# Patient Record
Sex: Female | Born: 1980
Health system: Southern US, Community
[De-identification: ages and names within clinical notes are randomized; demographics above are authoritative.]

## PROBLEM LIST (undated history)

## (undated) ENCOUNTER — Inpatient Hospital Stay (HOSPITAL_COMMUNITY): Payer: Self-pay

## (undated) DIAGNOSIS — Z8669 Personal history of other diseases of the nervous system and sense organs: Secondary | ICD-10-CM

## (undated) DIAGNOSIS — R5382 Chronic fatigue, unspecified: Secondary | ICD-10-CM

## (undated) DIAGNOSIS — F319 Bipolar disorder, unspecified: Secondary | ICD-10-CM

## (undated) DIAGNOSIS — H539 Unspecified visual disturbance: Secondary | ICD-10-CM

## (undated) DIAGNOSIS — G43909 Migraine, unspecified, not intractable, without status migrainosus: Secondary | ICD-10-CM

## (undated) DIAGNOSIS — D509 Iron deficiency anemia, unspecified: Secondary | ICD-10-CM

## (undated) DIAGNOSIS — Z87440 Personal history of urinary (tract) infections: Secondary | ICD-10-CM

## (undated) DIAGNOSIS — Z87442 Personal history of urinary calculi: Secondary | ICD-10-CM

## (undated) DIAGNOSIS — G43709 Chronic migraine without aura, not intractable, without status migrainosus: Secondary | ICD-10-CM

## (undated) DIAGNOSIS — M797 Fibromyalgia: Secondary | ICD-10-CM

## (undated) DIAGNOSIS — K219 Gastro-esophageal reflux disease without esophagitis: Secondary | ICD-10-CM

## (undated) DIAGNOSIS — F32A Depression, unspecified: Secondary | ICD-10-CM

## (undated) DIAGNOSIS — F410 Panic disorder [episodic paroxysmal anxiety] without agoraphobia: Secondary | ICD-10-CM

## (undated) DIAGNOSIS — T1491XA Suicide attempt, initial encounter: Secondary | ICD-10-CM

## (undated) DIAGNOSIS — F419 Anxiety disorder, unspecified: Secondary | ICD-10-CM

## (undated) DIAGNOSIS — G9332 Myalgic encephalomyelitis/chronic fatigue syndrome: Secondary | ICD-10-CM

## (undated) DIAGNOSIS — G4700 Insomnia, unspecified: Secondary | ICD-10-CM

## (undated) DIAGNOSIS — N3010 Interstitial cystitis (chronic) without hematuria: Secondary | ICD-10-CM

## (undated) DIAGNOSIS — T7840XA Allergy, unspecified, initial encounter: Secondary | ICD-10-CM

## (undated) DIAGNOSIS — F329 Major depressive disorder, single episode, unspecified: Secondary | ICD-10-CM

## (undated) HISTORY — DX: Personal history of urinary (tract) infections: Z87.440

## (undated) HISTORY — DX: Gastro-esophageal reflux disease without esophagitis: K21.9

## (undated) HISTORY — DX: Panic disorder (episodic paroxysmal anxiety): F41.0

## (undated) HISTORY — DX: Bipolar disorder, unspecified: F31.9

## (undated) HISTORY — DX: Allergy, unspecified, initial encounter: T78.40XA

## (undated) HISTORY — PX: CHOLECYSTECTOMY: SHX55

## (undated) HISTORY — DX: Personal history of urinary calculi: Z87.442

## (undated) HISTORY — PX: ADENOIDECTOMY: SHX5191

## (undated) HISTORY — DX: Chronic migraine without aura, not intractable, without status migrainosus: G43.709

## (undated) HISTORY — DX: Suicide attempt, initial encounter: T14.91XA

## (undated) HISTORY — PX: KNEE SURGERY: SHX244

## (undated) HISTORY — DX: Migraine, unspecified, not intractable, without status migrainosus: G43.909

## (undated) HISTORY — DX: Fibromyalgia: M79.7

## (undated) HISTORY — PX: HEMANGIOMA EXCISION: SHX1734

## (undated) HISTORY — DX: Insomnia, unspecified: G47.00

## (undated) HISTORY — DX: Interstitial cystitis (chronic) without hematuria: N30.10

## (undated) HISTORY — DX: Personal history of other diseases of the nervous system and sense organs: Z86.69

## (undated) HISTORY — DX: Unspecified visual disturbance: H53.9

---
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
## 2004-01-23 ENCOUNTER — Inpatient Hospital Stay (HOSPITAL_COMMUNITY): Admission: RE | Admit: 2004-01-23 | Discharge: 2004-01-26 | Payer: Self-pay | Admitting: Psychiatry

## 2004-09-17 ENCOUNTER — Ambulatory Visit: Payer: Self-pay | Admitting: Psychiatry

## 2004-09-17 ENCOUNTER — Inpatient Hospital Stay (HOSPITAL_COMMUNITY): Admission: EM | Admit: 2004-09-17 | Discharge: 2004-09-22 | Payer: Self-pay | Admitting: Psychiatry

## 2006-10-13 ENCOUNTER — Ambulatory Visit (HOSPITAL_COMMUNITY): Payer: Self-pay | Admitting: Psychiatry

## 2010-08-04 ENCOUNTER — Encounter: Payer: Self-pay | Admitting: Family Medicine

## 2010-10-10 ENCOUNTER — Ambulatory Visit (INDEPENDENT_AMBULATORY_CARE_PROVIDER_SITE_OTHER): Payer: 59 | Admitting: Internal Medicine

## 2010-10-10 ENCOUNTER — Encounter: Payer: Self-pay | Admitting: Internal Medicine

## 2010-10-10 ENCOUNTER — Other Ambulatory Visit: Payer: Self-pay | Admitting: Internal Medicine

## 2010-10-10 ENCOUNTER — Ambulatory Visit (HOSPITAL_BASED_OUTPATIENT_CLINIC_OR_DEPARTMENT_OTHER)
Admission: RE | Admit: 2010-10-10 | Discharge: 2010-10-10 | Disposition: A | Discharge status: Home / Self Care | Payer: 59 | Source: Ambulatory Visit | Attending: Internal Medicine | Admitting: Internal Medicine

## 2010-10-10 VITALS — BP 124/60 | HR 87 | Temp 98.7°F | Resp 18 | Ht 64.25 in | Wt 189.0 lb

## 2010-10-10 DIAGNOSIS — E538 Deficiency of other specified B group vitamins: Secondary | ICD-10-CM

## 2010-10-10 DIAGNOSIS — M79609 Pain in unspecified limb: Secondary | ICD-10-CM | POA: Insufficient documentation

## 2010-10-10 DIAGNOSIS — M545 Low back pain, unspecified: Secondary | ICD-10-CM | POA: Insufficient documentation

## 2010-10-10 DIAGNOSIS — F319 Bipolar disorder, unspecified: Secondary | ICD-10-CM

## 2010-10-10 DIAGNOSIS — K219 Gastro-esophageal reflux disease without esophagitis: Secondary | ICD-10-CM

## 2010-10-10 DIAGNOSIS — M2550 Pain in unspecified joint: Secondary | ICD-10-CM

## 2010-10-10 LAB — CBC WITH DIFFERENTIAL/PLATELET
Basophils Absolute: 0.1 10*3/uL (ref 0.0–0.1)
Basophils Relative: 1 % (ref 0–1)
HCT: 39 % (ref 36.0–46.0)
Hemoglobin: 13.3 g/dL (ref 12.0–15.0)
Lymphocytes Relative: 21 % (ref 12–46)
Monocytes Absolute: 0.7 10*3/uL (ref 0.1–1.0)
Monocytes Relative: 7 % (ref 3–12)
Neutro Abs: 7.9 10*3/uL — ABNORMAL HIGH (ref 1.7–7.7)
Neutrophils Relative %: 71 % (ref 43–77)
WBC: 11.1 10*3/uL — ABNORMAL HIGH (ref 4.0–10.5)

## 2010-10-10 LAB — C-REACTIVE PROTEIN: CRP: 1.1 mg/dL — ABNORMAL HIGH (ref <0.6)

## 2010-10-10 LAB — T4, FREE: Free T4: 0.81 ng/dL (ref 0.80–1.80)

## 2010-10-10 LAB — VITAMIN B12: Vitamin B-12: 481 pg/mL (ref 211–911)

## 2010-10-10 NOTE — Patient Instructions (Signed)
Discuss changing antidepressant to cymbalta with your psychiatrist Our office will contact you re:  Referral to rheumatologist

## 2010-10-10 NOTE — Progress Notes (Signed)
Subjective:    Patient ID: Katie [name], female    DOB: August 23, 1980, 30 y.o.   MRN: [id number]  HPI  Pt has not had PCP.  Pt has been bouncing to specialist re:  Unexplained  Pain.   Started fall of 2010.  Started top of left foot.  Started out at urgent care.  Eventually went to ortho.  Evaluated by ortho and podiatrist.   Symptoms got worse on - symptoms moved to left knee  Later saw neurosurgeon , then neurologist at [facility].  They suspected MS but work up negative MRI of brain and c spine reported normal.  Pt's symptoms started out predominately lower ext but now upper ext  She was later seen by neurologist at [facility].  Dr. Lilian Kapur did not think pain was related to MS They found b12 low normal and CRP elevated.   She was referred to pain mgt denie hx of loose stools or diarrhea  Pt main complaints - left sided neck pain and left SI joint pain.    occ left sided wrist pain and left toe pain  Psychiatrist - 7-8 yrs ago diagnosed with bipolar.   Followed by Dr. Wynetta Emery at [facility].  When she walks into dark room pt reports she has poor night vision in right eye  Denies fam hx of CTD  Past Medical History  Diagnosis Date  . Asthma   . Bipolar affective disorder     onogoing Dr Maye Hides treatment for the past 10 years  . GERD (gastroesophageal reflux disease)   . Allergy   . Migraines     teenage onset with periods  . History of recurrent UTIs     cystoscopy with Dr Edwin Cap [facility]  . History of kidney stones     highschool    History   Social History  . Marital Status: Married    Spouse Name: N/A    Number of Children: N/A  . Years of Education: N/A   Occupational History  . preschool teacher and nanny    Social History Main Topics  . Smoking status: Never Smoker   . Smokeless tobacco: Not on file  . Alcohol Use: Not on file  . Drug Use: Not on file  . Sexually Active: Not on file   Other Topics Concern  . Not on file   Social History  Narrative   [place]upation - prev occupation.  Preschool teacher and nannyNever smoked.  No etoh no drugs    Past Surgical History  Procedure Date  . Adenoidectomy     1990  . Knee surgery     left knee torn cartilage removed 1994  . Hemangioma excision     2009 removal from right ar    Family History  Problem Relation Age of Onset  . Alcohol abuse Paternal Grandmother   . Alcohol abuse Paternal Grandfather   . Alcohol abuse Maternal Grandfather   . Osteoarthritis Mother   . Osteoarthritis Father   . Cancer Mother     x 2  . Lung cancer Maternal Grandmother   . Lung cancer Paternal Grandfather   . Prostate cancer Maternal Grandfather   . Hyperlipidemia Mother   . Hypertension Mother   . Other Father     Depression.anxiety  . Diabetes Mother     type 2  . Diabetes Maternal Grandmother   . Diabetes Maternal Aunt     No Known Allergies  No current outpatient prescriptions on  file prior to visit.    BP 124/60  Pulse 87  Temp(Src) 98.7 F (37.1 C) (Oral)  Resp 18  Ht 5' 4.25" (1.632 m)  Wt 189 lb (85.73 kg)  BMI 32.19 kg/m2  SpO2 99%  LMP 09/30/2010      Review of Systems  Constitutional: Positive for fatigue. Negative for activity change and appetite change.  HENT: Negative for neck pain.   Eyes: Negative for pain and visual disturbance.  Respiratory: Negative for chest tightness, shortness of breath and wheezing.   Cardiovascular: Negative for chest pain and palpitations.  Genitourinary: Negative.   Musculoskeletal: Positive for back pain and arthralgias. Negative for joint swelling.  Neurological: Negative for syncope, facial asymmetry, speech difficulty and numbness.  Psychiatric/Behavioral: Negative for behavioral problems and dysphoric mood.       Objective:   Physical Exam  Constitutional: She is oriented to person, place, and time. She appears well-developed and well-nourished. No distress.  HENT:  Head:  Normocephalic and atraumatic.  Right Ear: External ear normal.  Left Ear: External ear normal.  Mouth/Throat: Oropharynx is clear and moist.  Eyes: Conjunctivae are normal. Pupils are equal, round, and reactive to light. Right eye exhibits no discharge. Left eye exhibits no discharge.  Neck: Normal range of motion. Neck supple.  Cardiovascular: Normal rate, regular rhythm and normal heart sounds.   Pulmonary/Chest: Effort normal and breath sounds normal. She has no wheezes. She has no rales.  Abdominal: Soft. Bowel sounds are normal.  Musculoskeletal: Normal range of motion. She exhibits no tenderness.  Lymphadenopathy:    She has no cervical adenopathy.  Neurological: She is alert and oriented to person, place, and time. She has normal reflexes. She displays normal reflexes. No cranial nerve deficit. She exhibits normal muscle tone. Coordination normal.  Skin: Skin is warm.  Psychiatric: She has a normal mood and affect. Her behavior is normal.          Assessment & Plan:

## 2010-10-11 ENCOUNTER — Telehealth: Payer: Self-pay | Admitting: *Deleted

## 2010-10-11 LAB — HEPATIC FUNCTION PANEL
AST: 17 U/L (ref 0–37)
Alkaline Phosphatase: 65 U/L (ref 39–117)
Bilirubin, Direct: <0.1 mg/dL (ref 0.0–0.3)
Total Bilirubin: 0.3 mg/dL (ref 0.3–1.2)

## 2010-10-11 NOTE — Telephone Encounter (Signed)
Call placed to patient at [phone], no answer. A detailed voice message was left informing patient per Dr Artist Pais instructions. Copy of blood work mailed to patient

## 2010-10-11 NOTE — Telephone Encounter (Signed)
Message copied by Glendell Docker on Fri Oct 11, 2010  8:44 AM ------      Message from: Thomos Lemons DR.      Created: Fri Oct 11, 2010  8:23 AM       Call pt - blood work is normal.        Please mail copy to pt

## 2010-10-13 LAB — METHYLMALONIC ACID, SERUM: Methylmalonic Acid, Quantitative: 139 nmol/L (ref 87–318)

## 2010-10-14 ENCOUNTER — Encounter: Payer: Self-pay | Admitting: Internal Medicine

## 2010-10-14 DIAGNOSIS — K219 Gastro-esophageal reflux disease without esophagitis: Secondary | ICD-10-CM | POA: Insufficient documentation

## 2010-10-14 DIAGNOSIS — F319 Bipolar disorder, unspecified: Secondary | ICD-10-CM | POA: Insufficient documentation

## 2010-10-14 DIAGNOSIS — E538 Deficiency of other specified B group vitamins: Secondary | ICD-10-CM | POA: Insufficient documentation

## 2010-10-14 NOTE — Assessment & Plan Note (Addendum)
Pt reports prev b12 level low normal She has been taking oral supplement Monitor b12 Her symptoms not consistent with subacute combined degeneration  Consider check celiac sprue panel

## 2010-10-14 NOTE — Assessment & Plan Note (Addendum)
Managed by psych. Reported stable Question musculoskeletal symptoms related to physical symptoms of depression/bipolar Pt advised to discuss changing luvox to cymbalta with psychiatrist

## 2010-10-14 NOTE — Assessment & Plan Note (Signed)
30 y/o with hx of unexplained arthralgias on left side of her body  - left foot,  Left SI joint.  Left neck pain.  Prev neurologic work up negative Refer to rheum for further eval

## 2010-10-16 LAB — CELIAC PANEL 10
Endomysial Screen: NEGATIVE
IgA: 71 mg/dL (ref 68–378)

## 2010-11-04 ENCOUNTER — Ambulatory Visit (INDEPENDENT_AMBULATORY_CARE_PROVIDER_SITE_OTHER): Payer: 59 | Admitting: Internal Medicine

## 2010-11-04 ENCOUNTER — Other Ambulatory Visit: Payer: Self-pay | Admitting: Internal Medicine

## 2010-11-04 ENCOUNTER — Encounter: Payer: Self-pay | Admitting: Internal Medicine

## 2010-11-04 VITALS — BP 122/80 | HR 82 | Temp 98.2°F | Resp 18 | Wt 185.0 lb

## 2010-11-04 DIAGNOSIS — M2550 Pain in unspecified joint: Secondary | ICD-10-CM

## 2010-11-04 DIAGNOSIS — R197 Diarrhea, unspecified: Secondary | ICD-10-CM

## 2010-11-04 MED ORDER — OMEPRAZOLE 40 MG PO CPDR: 40.0000 mg | DELAYED_RELEASE_CAPSULE | Freq: Every day | ORAL | Status: DC

## 2010-11-04 NOTE — Progress Notes (Signed)
Subjective:    Patient ID: Katie [name], female    DOB: February 04, 1981, 30 y.o.   MRN: [id number]  HPI 30 y/o female for follow up.   Pt evaluated by rheum.  No inflammatory etiology of pt's msk symptoms.  Rheum recommends NSAIDs but not while pt still taking lithium.  Pt does not want to stop lithium because it has made significant improvements for bipolar.  Lamictal was tried in the past.  Pt also c/o loose stools and abd pain after eating.  No vomiting   Review of Systems No fever Past Medical History  Diagnosis Date  . Asthma   . Bipolar affective disorder     onogoing Dr Maye Hides treatment for the past 10 years  . GERD (gastroesophageal reflux disease)   . Allergy   . Migraines     teenage onset with periods  . History of recurrent UTIs     cystoscopy with Dr Edwin Cap [facility]  . History of kidney stones     highschool    History   Social History  . Marital Status: Married    Spouse Name: N/A    Number of Children: N/A  . Years of Education: N/A   Occupational History  . preschool teacher and nanny    Social History Main Topics  . Smoking status: Never Smoker   . Smokeless tobacco: Not on file  . Alcohol Use: No  . Drug Use: No  . Sexually Active: Not on file   Other Topics Concern  . Not on file   Social History Narrative   [place]upation - prev occupation.  Preschool teacher and nannyNever smoked.  No etoh no drugs    Past Surgical History  Procedure Date  . Adenoidectomy     1990  . Knee surgery     left knee torn cartilage removed 1994  . Hemangioma excision     2009 removal from right ar    Family History  Problem Relation Age of Onset  . Alcohol abuse Paternal Grandmother   . Alcohol abuse Paternal Grandfather   . Alcohol abuse Maternal Grandfather   . Osteoarthritis Mother   . Osteoarthritis Father   . Cancer Mother     x 2  . Lung cancer Maternal Grandmother   . Lung cancer Paternal Grandfather   .  Prostate cancer Maternal Grandfather   . Hyperlipidemia Mother   . Hypertension Mother   . Other Father     Depression.anxiety  . Diabetes Mother     type 2  . Diabetes Maternal Grandmother   . Diabetes Maternal Aunt     No Known Allergies  Current Outpatient Prescriptions on File Prior to Visit  Medication Sig Dispense Refill  . cyanocobalamin 500 MCG tablet Take 500 mcg by mouth daily.        . Fluvoxamine Maleate (LUVOX CR) 100 MG CP24 Take 100 mg by mouth at bedtime.        Marland Kitchen HYDROcodone-acetaminophen (NORCO) 7.5-325 MG per tablet Take 1 tablet by mouth every 4 (four) hours as needed.        . lithium 300 MG tablet Take 300 mg by mouth. Take 3.5 tablets by mouth at bedtime       . Norgestim-Eth Estrad Triphasic (TRI-SPRINTEC) 0.18/0.215/0.25 MG-35 MCG TABS Take by mouth daily.        Marland Kitchen oxymetazoline (AFRIN) 0.05 % nasal spray 2 sprays by Nasal route 2 (two) times daily.        Marland Kitchen  zonisamide (ZONEGRAN) 100 MG capsule Take 100 mg by mouth. 5 capsules by mouth once daily at bedtime      . cyclobenzaprine (FLEXERIL) 10 MG tablet Take 10 mg by mouth 2 (two) times daily as needed.          BP 122/80  Pulse 82  Temp(Src) 98.2 F (36.8 C) (Oral)  Resp 18  Wt 185 lb (83.915 kg)  SpO2 99%  LMP 09/30/2010        Objective:   Physical Exam  Constitutional: She appears well-developed and well-nourished.  HENT:  Head: Normocephalic and atraumatic.  Cardiovascular: Normal rate and regular rhythm.   Pulmonary/Chest: Effort normal and breath sounds normal. She has no rales.  Abdominal: Soft. Bowel sounds are normal.       Mild diffuse tendnerness.  No guarding or rigidity.  No masses       Assessment & Plan:

## 2010-11-04 NOTE — Patient Instructions (Signed)
Taper off zonisamide as directed by your psychiatrist Please call our office if your abdominal symptoms do not improve or gets worse.

## 2010-11-05 LAB — HCG, SERUM, QUALITATIVE: Preg, Serum: NEGATIVE

## 2010-11-05 LAB — H. PYLORI ANTIBODY, IGG: H Pylori IgG: 0.44 {ISR}

## 2010-11-20 NOTE — Assessment & Plan Note (Signed)
Rheum work up negative.   Question fibromyalgia.   Follow up with psych re:  Switch to cymbalta

## 2010-11-20 NOTE — Assessment & Plan Note (Addendum)
Celiac sprue panel was negative.  Possible IBS.  Rule out infectious etiology.  Check for O & P Pt also thinks symptoms may be associated with start of zonisamide.  Taper off medication

## 2010-12-02 ENCOUNTER — Ambulatory Visit: Payer: 59 | Admitting: Internal Medicine

## 2011-01-07 ENCOUNTER — Telehealth: Payer: Self-pay | Admitting: Internal Medicine

## 2011-01-07 MED ORDER — OMEPRAZOLE 40 MG PO CPDR: 40.0000 mg | DELAYED_RELEASE_CAPSULE | Freq: Every day | ORAL | Status: DC

## 2011-01-07 NOTE — Telephone Encounter (Signed)
Rx refill sent to pharmacy. 

## 2011-01-07 NOTE — Telephone Encounter (Signed)
Refill- omeprazole 40mg  cap apot. Take one capsule by mouth one time daily.qty 30. Last fill 6.16.12

## 2011-03-10 ENCOUNTER — Encounter: Payer: 59 | Admitting: Internal Medicine

## 2011-04-14 ENCOUNTER — Encounter: Payer: Self-pay | Admitting: Internal Medicine

## 2011-04-14 ENCOUNTER — Ambulatory Visit (INDEPENDENT_AMBULATORY_CARE_PROVIDER_SITE_OTHER): Payer: 59 | Admitting: Internal Medicine

## 2011-04-14 ENCOUNTER — Other Ambulatory Visit: Payer: Self-pay | Admitting: Internal Medicine

## 2011-04-14 VITALS — BP 102/60 | HR 64 | Temp 98.7°F | Ht 64.0 in | Wt 185.0 lb

## 2011-04-14 DIAGNOSIS — R202 Paresthesia of skin: Secondary | ICD-10-CM

## 2011-04-14 DIAGNOSIS — F341 Dysthymic disorder: Secondary | ICD-10-CM

## 2011-04-14 DIAGNOSIS — F329 Major depressive disorder, single episode, unspecified: Secondary | ICD-10-CM

## 2011-04-14 DIAGNOSIS — M2550 Pain in unspecified joint: Secondary | ICD-10-CM

## 2011-04-14 DIAGNOSIS — R209 Unspecified disturbances of skin sensation: Secondary | ICD-10-CM

## 2011-04-14 DIAGNOSIS — R51 Headache: Secondary | ICD-10-CM

## 2011-04-14 DIAGNOSIS — F419 Anxiety disorder, unspecified: Secondary | ICD-10-CM

## 2011-04-15 NOTE — Progress Notes (Signed)
MD wanted a MRI of T-spine and LS spine w/o contrast.  Patient did not want to get this done at this time due to health savings account.  Patient stated she would contact us when ready for the imaging.  Prior authorization was initiated for both procedures.  Insurance has approved MRI of T-spine through May 29, 2011 approval #[id number].  LS spine was initiated and is pending approval of Physician-to-Physician phone call [phone] option 4.

## 2011-04-17 ENCOUNTER — Other Ambulatory Visit: Payer: 59

## 2011-06-13 ENCOUNTER — Other Ambulatory Visit: Payer: Self-pay

## 2011-06-13 MED ORDER — OMEPRAZOLE 40 MG PO CPDR: 40.0000 mg | DELAYED_RELEASE_CAPSULE | Freq: Every day | ORAL | Status: DC

## 2011-06-13 NOTE — Telephone Encounter (Signed)
rx sent to pharmacy

## 2011-07-14 NOTE — Patient Instructions (Signed)
I am recommending MRI studies to evaluate for possible multiple sclerosis. I understand you may want to wait because of finances. I have given you in order to have Lyme study drawn at [facility] however I think neurology evaluation in a major Medical Center would be a good idea.

## 2011-08-10 ENCOUNTER — Encounter: Payer: Self-pay | Admitting: Internal Medicine

## 2011-08-10 NOTE — Progress Notes (Signed)
Subjective:    Patient ID: Katie [name], female    DOB: 1980/10/31, 31 y.o.   MRN: [id number]  HPI Pt. is followed by Dr. Thomos Lemons at [facility]. I was asked by her mother-in-law who works here as an Public house manager to see patient. Patient says she has a history of anxiety depression and bipolar disorder. Apparently was hospitalized in 2004 in 2005 for depression. History of fractured left ankle 1994, torn cartilage left knee 1994, sprain left ankle 2008. Had bilateral grommet tubes placed and adenoidectomy in 1990, left knee surgery for torn cartilage 1994, right arm hemangioma removed 2009. Patient has been on lithium for depression for 8 years. History of GE reflux. Also takes Luvox for depression and trace of done. Takes hydrocodone/APAP 7.5/325 mg 3 or 4 times daily for pain.  Patient has several complaints. Patient complains of not being able to see out of her right eye at night. Problems with pain began November 2010. Had left foot pain. It hurt to have she you touch the top of her foot. Her foot felt cold. It hurt to walk on it. She saw Dr. Lajoyce Corners and Dr. Forest Becker. Had 2 MRIs of her foot. Pain then moved up to her knee. She began to have some blurry vision. Began to have some issues of left hand with itching and burning of the fingers. Her left wrist was painful. She tried gabapentin and Lyrica without help. She had pain in her left TM and apparently had an injection for pain from a pain management physician that helped only for one or 2 days. Has never had MRI of the spine. Is on tri-Sprintec oral contraceptives.  She is married without children. Spouse is 71 years old and works as a Civil Service fast streamer for [employer]. Patient does not smoke or consume alcohol. She is unable to work because of these health problems.  She brings in records from [facility] where she had an MRI of the C-spine with and without contrast which was normal. At that time she was complaining of ataxia. She also  had an MRI of the brain with and without contrast ordered by Dr. Channing Mutters in June 2011. She had subtle left Ventricular T2 hyperintensities anteriorly which were nonspecific but thought to be attributed to possible chronic microvascular ischemia, multiple sclerosis, complicated migraine, vasculitis or inflammatory process. Subsequently in July 2011 had visual evoked responses which were normal. Dr. Channing Mutters felt she might have multiple sclerosis and referred her to Dr. Epimenio Foot at [facility]  Began to have some involuntary movements of left hand. Developed fatigue. History of daily headaches since teenage years. Was prescribed Lyrica for headaches but blurry vision developed and her dose was reduced from 50 mg twice daily to 50 mg daily. At one time was tried on amitriptyline. Treats daily headache with Tylenol. She once took Imitrex for headache with benefit. History of interstitial cystitis but says that is no longer a problem. Sees Dr. Maye Hides at [facility] for anxiety depression. Is unable to work because of these issues. Dr. Epimenio Foot at [facility] felt patient could possibly have demyelinating disease but MRI was not completely diagnostic. She was found to have left ankle clonus and visual changes but normal left funduscopic exam.  Was treated with Geodon several years ago. Has developed issues with fine motor skills with left hand. Occasionally gets strangled when she swallows. History of facial tics attributed to tardive dyskinesia. Apparently was on restaurant as well as Geodon. Was on Cogentin for some  time but stopped by psychiatrist. In November 2011 Dr. Epimenio Foot gave her a diagnosis of optic neuritis. In January 2012 was given a diagnosis of occipital neuralgia and cervicalgia.  In 2011 she had a lumbar puncture. Records state that the results were borderline normal with one oligoclonal band and an IgG index of 0.6 with greater than 0.7 being abnormal. Received an occipital nerve block with 80 mg  Depo-Medrol with Marcaine August 2011.  Patient has become convinced that she has Lyme disease. She is requesting a Western blot test for Lyme disease. She has never had MRIs are performed thoracic or lumbar spine. Has tried Flexeril for pain and headache. Trazodone helps insomnia.  Family history: Father age 21 with history of depression; mother age 58 with diabetes and history of breast cancer twice, one sister with history of anxiety depression. No children.  Review of systems remarkable for weakness in legs, headaches, difficulty hearing, night sweats, urination at night, muscle cramps in extremities, low back pain, joint pain, itching of the skin, crying spells, nervousness, chronic fatigue, anxiety depression  She also apparently saw Dr. Ninetta Lights at [facility] and had nerve conduction studies. We do not have that report, requested it in September but have not received it. Sedimentation rate November 2011 was 23, ANA November 2011 was negative cANCA and pANCA were both negative. Total protein and spinal fluid 29.6 which is within normal limits. 3 white blood cell found in spinal fluid sample., It is majority of cells were lymphocytes. Fluid was clear and colorless. One red blood cell. VDRL on spinal fluid was nonreactive.    Review of Systems as above     Objective:   Physical Exam  patient is articulate without dysarthria. No acute distress in the office. Head is normocephalic and atraumatic. Carotid arteries are 2+ without bruits. Chest clear to auscultation. Cardiac exam regular rate and rhythm with normal S1 and S2 and no murmurs. Extraocular movements are full but there appears to be slight nystagmus left eye. Funduscopic exam is benign. Tongue is midline. Deep tendon reflexes 3+ and symmetrical arms and legs. Muscle strength appears to be grossly normal the upper and lower extremities. Finger to nose test is normal. Gait is normal.        Assessment & Plan:    Left-sided paresthesias arm and leg, abnormal MRI, borderline abnormal lumbar puncture, history of left visual disturbance, suspect demyelinating process. Perhaps could have focal dystonia.  Chronic daily headache  History of tics on antipsychotic medication  History of bipolar disorder/anxiety depression  Plan: She had her husband are rather insistent on having a Western blot Lyme titer drawn. If given them a prescription to have this done through [facility]. Have suggested that they seek referral any major medical Center for further evaluation. May need to have MRI of the brain repeated as well as additional MRI imaging of the spine. Requested nerve conduction studies from [facility] They will consider these options and let me know what they would like to do.

## 2011-08-13 ENCOUNTER — Ambulatory Visit (INDEPENDENT_AMBULATORY_CARE_PROVIDER_SITE_OTHER): Payer: 59 | Admitting: Internal Medicine

## 2011-08-13 DIAGNOSIS — R209 Unspecified disturbances of skin sensation: Secondary | ICD-10-CM

## 2011-08-13 DIAGNOSIS — R51 Headache: Secondary | ICD-10-CM

## 2011-08-13 DIAGNOSIS — R252 Cramp and spasm: Secondary | ICD-10-CM | POA: Insufficient documentation

## 2011-08-13 DIAGNOSIS — R202 Paresthesia of skin: Secondary | ICD-10-CM | POA: Insufficient documentation

## 2011-08-13 MED ORDER — GABAPENTIN 100 MG PO CAPS: ORAL_CAPSULE | ORAL | Status: DC

## 2011-08-13 NOTE — Assessment & Plan Note (Signed)
31 year old female with constellation of unexplained symptoms. She has intermittent blurred vision, headaches, paresthesias in her left hand and cramping in her lower extremeties. She has had neurologic evaluation in the past which was inconclusive with local neurologist.  I suggest referral to tertiary medical center for second opinion.

## 2011-08-13 NOTE — Assessment & Plan Note (Signed)
Patient complains of chronic pain and now intermittent cramps in her lower extremities. She has tried gabapentin in the past but was started at 300 mg. This caused excessive somnolence. Retry gabapentin at lower dose.

## 2011-08-13 NOTE — Progress Notes (Signed)
Subjective:    Patient ID: Katie [name], female    DOB: [birth year], 31 y.o.   MRN: [id number]  HPI  31 year old white female previously seen for possible fibromyalgia and questionable symptoms of multiple sclerosis for followup. Since previous visit patient was seen by Dr.  Lenord Fellers. Patient still has unexplained neurologic symptoms. She was urged to seek out a second opinion at tertiary medical center.  Recently, patient has been experiencing severe cramping lasting sensations in her lower extremities, severe headache and paresthesias of her left hand.  Review of Systems Negative for visual changes,  No change in speech  Past Medical History  Diagnosis Date  . Asthma   . Bipolar affective disorder     onogoing Dr Maye Hides treatment for the past 10 years  . GERD (gastroesophageal reflux disease)   . Allergy   . Migraines     teenage onset with periods  . History of recurrent UTIs     cystoscopy with Dr Edwin Cap [facility]  . History of kidney stones     highschool    History   Social History  . Marital Status: Married    Spouse Name: N/A    Number of Children: N/A  . Years of Education: N/A   Occupational History  . preschool teacher and nanny    Social History Main Topics  . Smoking status: Never Smoker   . Smokeless tobacco: Never Used  . Alcohol Use: No  . Drug Use: No  . Sexually Active: Yes    Birth Control/ Protection: Pill   Other Topics Concern  . Not on file   Social History Narrative   [place] 5 [place]Occupation - prev occupation.  Preschool teacher and nannyNever smoked.  No etoh no drugs    Past Surgical History  Procedure Date  . Adenoidectomy     1990  . Knee surgery     left knee torn cartilage removed 1994  . Hemangioma excision     2009 removal from right ar    Family History  Problem Relation Age of Onset  . Alcohol abuse Paternal Grandmother   . Alcohol abuse Paternal Grandfather   . Lung cancer Paternal  Grandfather   . Alcohol abuse Maternal Grandfather   . Prostate cancer Maternal Grandfather   . Osteoarthritis Mother   . Cancer Mother     x 2  . Hyperlipidemia Mother   . Hypertension Mother   . Diabetes Mother     type 2  . Osteoarthritis Father   . Other Father     Depression.anxiety  . Lung cancer Maternal Grandmother   . Diabetes Maternal Grandmother   . Diabetes Maternal Aunt     Allergies  Allergen Reactions  . Zonisamide Nausea And Vomiting    Current Outpatient Prescriptions on File Prior to Visit  Medication Sig Dispense Refill  . Fluvoxamine Maleate (LUVOX CR) 100 MG CP24 Take 100 mg by mouth at bedtime.        Marland Kitchen HYDROcodone-acetaminophen (NORCO) 7.5-325 MG per tablet Take 1 tablet by mouth every 4 (four) hours as needed.        . lithium 300 MG tablet Take 300 mg by mouth. Take 3.5 tablets by mouth at bedtime       . Norgestim-Eth Estrad Triphasic (TRI-SPRINTEC) 0.18/0.215/0.25 MG-35 MCG TABS Take by mouth daily.        Marland Kitchen omeprazole (PRILOSEC) 40 MG capsule Take 1 capsule (40 mg total) by mouth daily.  30  capsule  3  . oxymetazoline (AFRIN) 0.05 % nasal spray 2 sprays by Nasal route 2 (two) times daily.        . traZODone (DESYREL) 100 MG tablet Take 200 mg by mouth at bedtime. Two tabs daily at bedtime.        BP 102/66  Temp(Src) 98.4 F (36.9 C) (Oral)  Wt 187 lb (84.823 kg)       Objective:   Physical Exam  Constitutional: She is oriented to person, place, and time. She appears well-developed and well-nourished.  HENT:  Head: Normocephalic and atraumatic.  Right Ear: External ear normal.  Left Ear: External ear normal.  Eyes: Conjunctivae are normal. Pupils are equal, round, and reactive to light.       Negative swinging flash light test  Cardiovascular: Normal rate, regular rhythm and normal heart sounds.   Pulmonary/Chest: Effort normal and breath sounds normal. She has no wheezes. She has no rales.  Neurological: She is alert and oriented to  person, place, and time. No cranial nerve deficit.  Skin: Skin is warm and dry.  Psychiatric: She has a normal mood and affect. Her behavior is normal.          Assessment & Plan:

## 2011-09-10 ENCOUNTER — Encounter: Payer: Self-pay | Admitting: Internal Medicine

## 2011-09-10 ENCOUNTER — Ambulatory Visit (INDEPENDENT_AMBULATORY_CARE_PROVIDER_SITE_OTHER): Payer: Managed Care, Other (non HMO) | Admitting: Internal Medicine

## 2011-09-10 DIAGNOSIS — J069 Acute upper respiratory infection, unspecified: Secondary | ICD-10-CM

## 2011-09-10 MED ORDER — HYDROCODONE-ACETAMINOPHEN 7.5-325 MG PO TABS: 1.0000 | ORAL_TABLET | ORAL | Status: DC | PRN

## 2011-09-10 NOTE — Assessment & Plan Note (Signed)
31 year old white female with signs and symptoms of viral URI. We discussed symptomatic treatment.  Patient advised to call office if symptoms persist or worsen.

## 2011-09-10 NOTE — Progress Notes (Signed)
Subjective:    Patient ID: Katie [name], female    DOB: January 17, 1981, 31 y.o.   MRN: [id number]  URI  This is a new problem. The current episode started in the past 7 days. The problem has been unchanged. There has been no fever. Associated symptoms include congestion, coughing and a sore throat. Pertinent negatives include no plugged ear sensation. She has tried nothing for the symptoms.   She has appt with neurologist at [facility] in May, 2013   Review of Systems  HENT: Positive for congestion and sore throat.   Respiratory: Positive for cough.    Past Medical History  Diagnosis Date  . Asthma   . Bipolar affective disorder     onogoing Dr Maye Hides treatment for the past 10 years  . GERD (gastroesophageal reflux disease)   . Allergy   . Migraines     teenage onset with periods  . History of recurrent UTIs     cystoscopy with Dr Edwin Cap [facility]  . History of kidney stones     highschool    History   Social History  . Marital Status: Married    Spouse Name: N/A    Number of Children: N/A  . Years of Education: N/A   Occupational History  . preschool teacher and nanny    Social History Main Topics  . Smoking status: Never Smoker   . Smokeless tobacco: Never Used  . Alcohol Use: No  . Drug Use: No  . Sexually Active: Yes    Birth Control/ Protection: Pill   Other Topics Concern  . Not on file   Social History Narrative   [place] - prev occupation.  Preschool teacher and nannyNever smoked.  No etoh no drugs    Past Surgical History  Procedure Date  . Adenoidectomy     1990  . Knee surgery     left knee torn cartilage removed 1994  . Hemangioma excision     2009 removal from right ar    Family History  Problem Relation Age of Onset  . Alcohol abuse Paternal Grandmother   . Alcohol abuse Paternal Grandfather   . Lung cancer Paternal Grandfather   . Alcohol abuse Maternal Grandfather   . Prostate cancer  Maternal Grandfather   . Osteoarthritis Mother   . Cancer Mother     x 2  . Hyperlipidemia Mother   . Hypertension Mother   . Diabetes Mother     type 2  . Osteoarthritis Father   . Other Father     Depression.anxiety  . Lung cancer Maternal Grandmother   . Diabetes Maternal Grandmother   . Diabetes Maternal Aunt     Allergies  Allergen Reactions  . Zonisamide Nausea And Vomiting    Current Outpatient Prescriptions on File Prior to Visit  Medication Sig Dispense Refill  . Fluvoxamine Maleate (LUVOX CR) 100 MG CP24 Take 100 mg by mouth at bedtime.        Marland Kitchen lithium 300 MG tablet Take 300 mg by mouth. Take 3.5 tablets by mouth at bedtime       . Norgestim-Eth Estrad Triphasic (TRI-SPRINTEC) 0.18/0.215/0.25 MG-35 MCG TABS Take by mouth daily.        Marland Kitchen omeprazole (PRILOSEC) 40 MG capsule Take 1 capsule (40 mg total) by mouth daily.  30 capsule  3  . oxymetazoline (AFRIN) 0.05 % nasal spray 2 sprays by Nasal route 2 (two) times daily.        Marland Kitchen  tiZANidine (ZANAFLEX) 2 MG tablet Take 2 mg by mouth daily.      . traZODone (DESYREL) 100 MG tablet Take 200 mg by mouth at bedtime. Two tabs daily at bedtime.        BP 102/64  Pulse 80  Temp(Src) 98.6 F (37 C) (Oral)  Ht 5\' 4"  (1.626 m)  Wt 196 lb (88.905 kg)  BMI 33.64 kg/m2       Objective:   Physical Exam   Constitutional: Appears well-developed and well-nourished. No distress.  Head: Normocephalic and atraumatic.  Ear:  Right and left ear normal.  TMs clear.  Hearing is grossly normal Mouth/Throat: Mild oropharyngeal erythema   Neck: Supple, no adenopathy  Cardiovascular: Normal rate, regular rhythm and normal heart sounds.  Exam reveals no gallop and no friction rub.  No murmur heard. Pulmonary/Chest: Effort normal and breath sounds normal.  No wheezes. No rales.  Neurological: Alert. No cranial nerve deficit.  Skin: Skin is warm and dry.  Psychiatric: Normal mood and affect. Behavior is normal.      Assessment &  Plan:

## 2011-09-10 NOTE — Patient Instructions (Signed)
Gargle with warm salt water and use nasal saline as directed. Please call our office if your symptoms do not improve or gets worse.

## 2011-09-12 ENCOUNTER — Ambulatory Visit: Payer: Managed Care, Other (non HMO) | Admitting: Internal Medicine

## 2011-10-04 ENCOUNTER — Other Ambulatory Visit: Payer: Self-pay | Admitting: Family Medicine

## 2011-10-24 ENCOUNTER — Other Ambulatory Visit: Payer: Self-pay | Admitting: Internal Medicine

## 2011-11-21 ENCOUNTER — Telehealth: Payer: Self-pay | Admitting: Internal Medicine

## 2011-11-21 NOTE — Telephone Encounter (Signed)
Patient called stating that upon trying to get a refill of her hydrocodone she was told she need an office visit. Patient is stating she is seeing an Out patient Neurologist on Wed. And is insisting on getting some hydrocodone to last until then. Patient declined to schedule an office visit with PCP. Please advise.

## 2011-11-21 NOTE — Telephone Encounter (Signed)
I will not approve refill for hydrocodone.  Patient can discuss pain mgt issue with neurologist.  If she decides to schedule office visit with [place], we can discuss referral to pain mgt specialist.

## 2011-11-24 NOTE — Telephone Encounter (Signed)
Left message for pt to call back  °

## 2011-11-26 ENCOUNTER — Encounter: Payer: Self-pay | Admitting: Internal Medicine

## 2011-11-26 ENCOUNTER — Ambulatory Visit (INDEPENDENT_AMBULATORY_CARE_PROVIDER_SITE_OTHER): Payer: Managed Care, Other (non HMO) | Admitting: Internal Medicine

## 2011-11-26 VITALS — BP 130/90 | HR 78 | Temp 98.9°F | Wt 188.0 lb

## 2011-11-26 DIAGNOSIS — IMO0001 Reserved for inherently not codable concepts without codable children: Secondary | ICD-10-CM

## 2011-11-26 DIAGNOSIS — M797 Fibromyalgia: Secondary | ICD-10-CM | POA: Insufficient documentation

## 2011-11-26 MED ORDER — GABAPENTIN 300 MG PO CAPS: 300.0000 mg | ORAL_CAPSULE | Freq: Three times a day (TID) | ORAL | Status: DC

## 2011-11-26 MED ORDER — TRAMADOL HCL 50 MG PO TABS: 50.0000 mg | ORAL_TABLET | Freq: Three times a day (TID) | ORAL | Status: DC | PRN

## 2011-11-26 NOTE — Assessment & Plan Note (Signed)
31 year old female with unexplained paresthesias and musculoskelatal pain of unclear etiology. Rheumatologic and neurologic evaluation negative so far. She likely has fibromyalgia. Her neurologist recommends referral to pain management specialist. I agree. Increase gabapentin to 300 mg 3 times a day as needed. Use tramadol until she is seen by pain management specialist

## 2011-11-26 NOTE — Progress Notes (Signed)
Subjective:    Patient ID: Katie [name], female    DOB: 05/11/1981, 31 y.o.   MRN: [id number]  HPI  31 year old white female with chronic arthralgia and lower extremity cramps for routine followup. Since previous visit patient was [facility]. Neurologist does not feel she has multiple sclerosis but MRI of brain has been scheduled. She continues to have periods aches and pains. She has been using hydrocodone as prescribed by her previous neurologist as needed.  She is currently using gabapentin 300 mg  at 2 PM.  She is not having any issues with excessive somnolence.   Review of Systems Previous rheumatologic work up was negative  Past Medical History  Diagnosis Date  . Asthma   . Bipolar affective disorder     onogoing Dr Maye Hides treatment for the past 10 years  . GERD (gastroesophageal reflux disease)   . Allergy   . Migraines     teenage onset with periods  . History of recurrent UTIs     cystoscopy with Dr Edwin Cap [facility]  . History of kidney stones     highschool    History   Social History  . Marital Status: Married    Spouse Name: N/A    Number of Children: N/A  . Years of Education: N/A   Occupational History  . preschool teacher and nanny    Social History Main Topics  . Smoking status: Never Smoker   . Smokeless tobacco: Never Used  . Alcohol Use: No  . Drug Use: No  . Sexually Active: Yes    Birth Control/ Protection: Pill   Other Topics Concern  . Not on file   Social History Narrative   [place] - prev occupation.  Preschool teacher and nannyNever smoked.  No etoh no drugs    Past Surgical History  Procedure Date  . Adenoidectomy     1990  . Knee surgery     left knee torn cartilage removed 1994  . Hemangioma excision     2009 removal from right ar    Family History  Problem Relation Age of Onset  . Alcohol abuse Paternal Grandmother   . Alcohol abuse Paternal Grandfather   .  Lung cancer Paternal Grandfather   . Alcohol abuse Maternal Grandfather   . Prostate cancer Maternal Grandfather   . Osteoarthritis Mother   . Lymphoma Mother     Non-Hodgkin's lymphoma  . Hyperlipidemia Mother   . Hypertension Mother   . Diabetes Mother     type 2  . Osteoarthritis Father   . Other Father     Depression.anxiety  . Lung cancer Maternal Grandmother   . Diabetes Maternal Grandmother   . Diabetes Maternal Aunt     Allergies  Allergen Reactions  . Zonisamide Nausea And Vomiting    Current Outpatient Prescriptions on File Prior to Visit  Medication Sig Dispense Refill  . Fluvoxamine Maleate (LUVOX CR) 100 MG CP24 Take 100 mg by mouth at bedtime.        Marland Kitchen HYDROcodone-acetaminophen (NORCO) 7.5-325 MG per tablet Take 1 tablet by mouth every 4 (four) hours as needed.  30 tablet  1  . lithium 300 MG tablet Take 300 mg by mouth. Take 3.5 tablets by mouth at bedtime       . Norgestim-Eth Estrad Triphasic (TRI-SPRINTEC) 0.18/0.215/0.25 MG-35 MCG TABS Take by mouth daily.        Marland Kitchen omeprazole (PRILOSEC) 40 MG capsule  TAKE ONE CAPSULE BY MOUTH ONE TIME DAILY  30 capsule  2  . oxymetazoline (AFRIN) 0.05 % nasal spray 2 sprays by Nasal route 2 (two) times daily.        Marland Kitchen tiZANidine (ZANAFLEX) 2 MG tablet Take 2 mg by mouth daily.      . traZODone (DESYREL) 100 MG tablet Take 200 mg by mouth at bedtime. Two tabs daily at bedtime.      Marland Kitchen DISCONTD: gabapentin (NEURONTIN) 300 MG capsule Take 300 mg by mouth daily.      Marland Kitchen DISCONTD: gabapentin (NEURONTIN) 100 MG capsule TAKE ONE TO THREE CAPSULES BY MOUTH AT BEDTIME AS DIRECTED  90 capsule  0    BP 130/90  Pulse 78  Temp(Src) 98.9 F (37.2 C) (Oral)  Wt 188 lb (85.276 kg)       Objective:   Physical Exam  Constitutional: She is oriented to person, place, and time. She appears well-developed and well-nourished.  Cardiovascular: Normal rate, regular rhythm and normal heart sounds.   Pulmonary/Chest: Effort normal and breath  sounds normal. She has no wheezes. She has no rales.  Musculoskeletal: She exhibits no edema.  Neurological: She is alert and oriented to person, place, and time.  Skin: Skin is warm and dry.  Psychiatric: She has a normal mood and affect. Her behavior is normal.          Assessment & Plan:

## 2011-12-03 ENCOUNTER — Encounter: Payer: Self-pay | Admitting: Physical Medicine and Rehabilitation

## 2011-12-17 ENCOUNTER — Encounter
Payer: Managed Care, Other (non HMO) | Attending: Physical Medicine and Rehabilitation | Admitting: Physical Medicine and Rehabilitation

## 2011-12-17 ENCOUNTER — Encounter: Payer: Self-pay | Admitting: Physical Medicine and Rehabilitation

## 2011-12-17 VITALS — BP 128/77 | HR 96 | Ht 64.0 in | Wt 190.0 lb

## 2011-12-17 DIAGNOSIS — M25532 Pain in left wrist: Secondary | ICD-10-CM

## 2011-12-17 DIAGNOSIS — R259 Unspecified abnormal involuntary movements: Secondary | ICD-10-CM | POA: Insufficient documentation

## 2011-12-17 DIAGNOSIS — M25539 Pain in unspecified wrist: Secondary | ICD-10-CM

## 2011-12-17 DIAGNOSIS — R279 Unspecified lack of coordination: Secondary | ICD-10-CM | POA: Insufficient documentation

## 2011-12-17 DIAGNOSIS — R209 Unspecified disturbances of skin sensation: Secondary | ICD-10-CM | POA: Insufficient documentation

## 2011-12-17 DIAGNOSIS — R5381 Other malaise: Secondary | ICD-10-CM | POA: Insufficient documentation

## 2011-12-17 DIAGNOSIS — M542 Cervicalgia: Secondary | ICD-10-CM | POA: Insufficient documentation

## 2011-12-17 DIAGNOSIS — R110 Nausea: Secondary | ICD-10-CM | POA: Insufficient documentation

## 2011-12-17 DIAGNOSIS — G8929 Other chronic pain: Secondary | ICD-10-CM

## 2011-12-17 NOTE — Patient Instructions (Signed)
I have ordered neck x-rays and left wrist x-rays  You have mentioned you could just a copy of your previous neck MRI report  We also talked about considering physical therapy at some point.  We will see you back in 2-4 weeks.

## 2011-12-17 NOTE — Progress Notes (Signed)
Subjective:    Patient ID: Katie [name], female    DOB: Sep 08, 1980, 31 y.o.   MRN: [id number]  HPI  The patient is a 31 year old married woman who presents to our clinic with multiple pain complaints. Her past medical history is significant for a major depressive disorder and anxiety. She's been seen by neurology Dr. Epimenio [place] who ruled out multiple sclerosis per her history. She tells me she has had 2 previous brain MRIs. The first set was done here and [facility] in the second one was done at [facility] in [place]. She has also seen a rheumatologist who told her her rheumatology blood work was all normal.  Her chief complaint today is neck pain. This pain began by 1-1/2 years ago which began as more of the neck stiffness, and a feeling of a knot but has progressed to more painful as well as diminished range of motion. There's no history of upper extremity numbness tingling or weakness. Pain is worse toward the end of the day. Average daily pain for her neck is about a 4 on a scale of 10 but can reach up to an 8 on a scale of 10. Prolonged rotation in one direction either right or left aggravates her pain as well as or poor posture also seems to aggravate.  She is currently not employed.  She is not on a home computer more than 2 hours each day but does notice that this may aggravate her neck pain. Also notes sleeping positions may seem to affect her pain as well.   Next biggest problem is her left wrist today.  She is a right handed woman. Her left wrist pain began approximately 2 years. There was no injury. About 2 years ago she was [facility]. She had on EMG nerve conduction studies done at that time. And I do not have the results of study. She's not had any x-rays at this point.  This pain is exacerbated by prolonged grasping of either phone, books, e- reader, video game controller, typing.  She also notes that she gets some extra movement that  are not-in all in the index middle occasionally ring finger. This occurs regularly. His extra movement resembles almost a twitch and he does not get in the way of her doing anything functionally. This is gone on for about 2 years as well.  She worked as a Manufacturing systems engineer for about 6 years. She stopped working about 2-1/2 years ago.   I also understand she has some intermittent back and left leg problems as well as some occasional right wrist problems . Today's focus however will be on her neck and her left wrist. She is comfortable with this.  Was on risperdol for 3-4 years,abilify.  ? Movement disorder/dystonia        Pain Inventory Average Pain 5 Pain Right Now 4 My pain is constant, dull, tingling and aching  In the last 24 hours, has pain interfered with the following? General activity 8 Relation with others 4 Enjoyment of life 8 What TIME of day is your pain at its worst? morning and night Sleep (in general) Fair  Pain is worse with: walking, standing and some activites Pain improves with: rest, heat/ice and medication Relief from Meds: 5  Mobility walk without assistance walk with assistance how many minutes can you walk? 20 min ability to climb steps?  yes do you drive?  yes Do you have any goals in this area?  yes  Function disabled: date disabled  I need assistance with the following:  meal prep, household duties and shopping Do you have any goals in this area?  yes  Neuro/Psych weakness tingling trouble walking depression anxiety  Prior Studies x-rays CT/MRI nerve study  Physicians involved in your care Dr Artist Pais, Dr Melody Haver   Family History  Problem Relation Age of Onset  . Alcohol abuse Paternal Grandmother   . Alcohol abuse Paternal Grandfather   . Lung cancer Paternal Grandfather   . Alcohol abuse Maternal Grandfather   . Prostate cancer Maternal Grandfather   . Osteoarthritis Mother   . Lymphoma Mother     Non-Hodgkin's lymphoma  .  Hyperlipidemia Mother   . Hypertension Mother   . Diabetes Mother     type 2  . Osteoarthritis Father   . Other Father     Depression.anxiety  . Lung cancer Maternal Grandmother   . Diabetes Maternal Grandmother   . Diabetes Maternal Aunt    History   Social History  . Marital Status: Married    Spouse Name: N/A    Number of Children: N/A  . Years of Education: N/A   Occupational History  . preschool teacher and nanny    Social History Main Topics  . Smoking status: Never Smoker   . Smokeless tobacco: Never Used  . Alcohol Use: No  . Drug Use: No  . Sexually Active: Yes    Birth Control/ Protection: Pill   Other Topics Concern  . None   Social History Narrative   Grew up in [employer] 5 yrsOccupation - prev occupation.  Preschool teacher and nannyNever smoked.  No etoh no drugs   Past Surgical History  Procedure Date  . Adenoidectomy     1990  . Knee surgery     left knee torn cartilage removed 1994  . Hemangioma excision     2009 removal from right ar   Past Medical History  Diagnosis Date  . Asthma   . Bipolar affective disorder     onogoing Dr Maye Hides treatment for the past 10 years  . GERD (gastroesophageal reflux disease)   . Allergy   . Migraines     teenage onset with periods  . History of recurrent UTIs     cystoscopy with Dr Edwin Cap [facility]  . History of kidney stones     highschool   BP 128/77  Pulse 96  Ht 5\' 4"  (1.626 m)  Wt 190 lb (86.183 kg)  BMI 32.61 kg/m2  SpO2 96%  LMP 11/19/2011     Review of Systems  Gastrointestinal: Positive for nausea.  Neurological: Positive for weakness.  Psychiatric/Behavioral: Positive for dysphoric mood.  All other systems reviewed and are negative.       Objective:   Physical Exam  Well-developed well-nourished woman in no apparent distress  Oriented x3 speech is clear affect is bright she's alert cooperative and pleasant  She follows commands without difficulty answers  questions appropriately  Multiple well healed scars noted over lower extremities and arms circular in nature, patient states she used to pick at her skin  Cranial nerves and coordination are grossly intact   Reflexes are 2+ at biceps triceps brachioradialis  Reflexes are brisk in the lower extremities with the couple beats of clonus.  Intact sensation to light touch and pinprick in upper and lower extremities  Intact vibratory N. position sense and lower extremities  Motor strength 5 over 5 upper and lower extremities  Difficulty with  tandem gait mildly Romberg test with some sway but performed adequately  Diminished range of motion of neck with rotation to the right  Full shoulder range of motion noted  Left wrist evaluated no obvious deformity noted.  Well preserve but range of motion at wrist  Some tenderness in the mid wrist posteriorly  Slight tremor/movement noted in index middle and ring finger while patient is at rest        Assessment & Plan:  1. Neck pain  2. Left wrist pain and hand, left hand movement disorder/tremor  We'll obtain cervical radiographs, and left wrist radiographs  Patient states she will get as a copy of her previous cervical MRI/and or report  Consider physical therapy for her neck pain, education on proper ergonomics for computer use, sleeping, pain management techniques, core strengthening.  Patient agrees with the above will see her back in 2-3 weeks

## 2011-12-31 ENCOUNTER — Telehealth: Payer: Self-pay | Admitting: Internal Medicine

## 2011-12-31 MED ORDER — PREGABALIN 75 MG PO CAPS: 75.0000 mg | ORAL_CAPSULE | Freq: Two times a day (BID) | ORAL | Status: DC

## 2011-12-31 NOTE — Telephone Encounter (Signed)
I suggest pt switch to lyrica 75 mg one tab bid.  # 60 with one refill.  Needs OV within 1 month

## 2011-12-31 NOTE — Telephone Encounter (Signed)
Pt seen Dr Artist Pais on 11/26/11 and it was increased to 300 mg tid and its not really helping now.

## 2011-12-31 NOTE — Telephone Encounter (Signed)
Caller: Katie [name]/Patient; PCP: Thomos Lemons; CB#: [phone];  Call regarding L Pain; Leg Noninjury Protocol. LMP 12/25/11. C/o cont'd L leg pain. Taking Neurontin 300mg  tid. Pt ? if she can up her dose. Pls call.

## 2011-12-31 NOTE — Telephone Encounter (Signed)
rx called in, pt aware 

## 2011-12-31 NOTE — Telephone Encounter (Signed)
How long has she been taking current dose of gabapentin?

## 2012-01-14 ENCOUNTER — Ambulatory Visit (HOSPITAL_COMMUNITY)
Admission: RE | Admit: 2012-01-14 | Discharge: 2012-01-14 | Disposition: A | Discharge status: Home / Self Care | Payer: Managed Care, Other (non HMO) | Source: Ambulatory Visit | Attending: Physical Medicine and Rehabilitation | Admitting: Physical Medicine and Rehabilitation

## 2012-01-14 DIAGNOSIS — M542 Cervicalgia: Secondary | ICD-10-CM | POA: Insufficient documentation

## 2012-01-14 DIAGNOSIS — M25539 Pain in unspecified wrist: Secondary | ICD-10-CM | POA: Insufficient documentation

## 2012-01-14 DIAGNOSIS — M79609 Pain in unspecified limb: Secondary | ICD-10-CM | POA: Insufficient documentation

## 2012-01-14 DIAGNOSIS — M25532 Pain in left wrist: Secondary | ICD-10-CM

## 2012-01-16 ENCOUNTER — Encounter: Payer: Self-pay | Admitting: Physical Medicine and Rehabilitation

## 2012-01-16 ENCOUNTER — Encounter
Payer: Managed Care, Other (non HMO) | Attending: Physical Medicine and Rehabilitation | Admitting: Physical Medicine and Rehabilitation

## 2012-01-16 VITALS — BP 131/60 | HR 77 | Resp 16 | Ht 64.0 in | Wt 188.0 lb

## 2012-01-16 DIAGNOSIS — R209 Unspecified disturbances of skin sensation: Secondary | ICD-10-CM | POA: Insufficient documentation

## 2012-01-16 DIAGNOSIS — M545 Low back pain, unspecified: Secondary | ICD-10-CM

## 2012-01-16 DIAGNOSIS — G248 Other dystonia: Secondary | ICD-10-CM

## 2012-01-16 DIAGNOSIS — R110 Nausea: Secondary | ICD-10-CM | POA: Insufficient documentation

## 2012-01-16 DIAGNOSIS — R279 Unspecified lack of coordination: Secondary | ICD-10-CM | POA: Insufficient documentation

## 2012-01-16 DIAGNOSIS — R5381 Other malaise: Secondary | ICD-10-CM | POA: Insufficient documentation

## 2012-01-16 DIAGNOSIS — M542 Cervicalgia: Secondary | ICD-10-CM | POA: Insufficient documentation

## 2012-01-16 DIAGNOSIS — M25539 Pain in unspecified wrist: Secondary | ICD-10-CM | POA: Insufficient documentation

## 2012-01-16 DIAGNOSIS — G2589 Other specified extrapyramidal and movement disorders: Secondary | ICD-10-CM

## 2012-01-16 DIAGNOSIS — R5383 Other fatigue: Secondary | ICD-10-CM | POA: Insufficient documentation

## 2012-01-16 DIAGNOSIS — R259 Unspecified abnormal involuntary movements: Secondary | ICD-10-CM | POA: Insufficient documentation

## 2012-01-16 NOTE — Progress Notes (Signed)
Subjective:    Patient ID: Katie [name], female    DOB: Jul 02, 1981, 31 y.o.   MRN: [id number]  HPI  The patient is a 31 year old woman who is seen for the second time today. In the answer all she has had cervical radiographs and left wrist radiographs completed which are reviewed with her today.  She has multiple pain complaints which include #1 low back pain, 2 left wrist pain, 3 intermittent left knee pain 4 intermittent right neck pain.  Her chief complaint today is low back pain. Her knee pain comes and goes and is not a main complaint today. Her neck is not bothering her as much today, she does describe it as a dull pain at its there are constantly.  Her back pain which is located in the mid to upper lumbar region began about one week ago. She woke up one day with this pain and cannot really think of anything that brought in on are exacerbated it.  Pain is worse typically when she sedentary. Laying down is worse than sitting. No fevers or tilt associated with this. No history of cancer. No problems with her bowel and bladder. There is no new weakness numbness or tingling in her legs.     Pain Inventory Average Pain 5 Pain Right Now 3 My pain is intermittent, sharp, burning, tingling and aching  In the last 24 hours, has pain interfered with the following? General activity 7 Relation with others 3 Enjoyment of life 4 What TIME of day is your pain at its worst? morning Sleep (in general) Poor  Pain is worse with: walking, bending and standing Pain improves with: rest, heat/ice and medication Relief from Meds: 6  Mobility walk without assistance walk with assistance how many minutes can you walk? 15 ability to climb steps?  no do you drive?  yes  Function disabled: date disabled  I need assistance with the following:  meal prep, household duties and shopping  Neuro/Psych weakness tingling trouble walking  Prior Studies Any changes since last visit?   no  Physicians involved in your care Any changes since last visit?  no   Family History  Problem Relation Age of Onset  . Alcohol abuse Paternal Grandmother   . Alcohol abuse Paternal Grandfather   . Lung cancer Paternal Grandfather   . Alcohol abuse Maternal Grandfather   . Prostate cancer Maternal Grandfather   . Osteoarthritis Mother   . Lymphoma Mother     Non-Hodgkin's lymphoma  . Hyperlipidemia Mother   . Hypertension Mother   . Diabetes Mother     type 2  . Osteoarthritis Father   . Other Father     Depression.anxiety  . Lung cancer Maternal Grandmother   . Diabetes Maternal Grandmother   . Diabetes Maternal Aunt    History   Social History  . Marital Status: Married    Spouse Name: N/A    Number of Children: N/A  . Years of Education: N/A   Occupational History  . preschool teacher and nanny    Social History Main Topics  . Smoking status: Never Smoker   . Smokeless tobacco: Never Used  . Alcohol Use: No  . Drug Use: No  . Sexually Active: Yes    Birth Control/ Protection: Pill   Other Topics Concern  . None   Social History Narrative   [place] in [place] 5 yrsOccupation - prev occupation.  Preschool teacher and nannyNever smoked.  No etoh no drugs   Past  Surgical History  Procedure Date  . Adenoidectomy     1990  . Knee surgery     left knee torn cartilage removed 1994  . Hemangioma excision     2009 removal from right ar   Past Medical History  Diagnosis Date  . Asthma   . Bipolar affective disorder     onogoing Dr Maye Hides treatment for the past 10 years  . GERD (gastroesophageal reflux disease)   . Allergy   . Migraines     teenage onset with periods  . History of recurrent UTIs     cystoscopy with Dr Edwin Cap [facility]  . History of kidney stones     highschool   BP 131/60  Pulse 77  Resp 16  Ht 5\' 4"  (1.626 m)  Wt 188 lb (85.276 kg)  BMI 32.27 kg/m2  SpO2 96%  LMP 12/18/2011     Review of Systems   HENT: Positive for neck pain and neck stiffness.   Eyes: Negative.   Respiratory: Negative.   Cardiovascular: Negative.   Gastrointestinal: Negative.   Genitourinary: Negative.   Musculoskeletal: Positive for back pain and gait problem.  Skin: Negative.   Neurological: Positive for weakness.  Hematological: Negative.   Psychiatric/Behavioral: Negative.        Objective:   Physical Exam Well-developed well-nourished woman in no apparent distress  Oriented x3 speech is clear affect is bright she's alert cooperative and pleasant  She follows commands without difficulty answers questions appropriately  Multiple well healed scars noted over lower extremities and arms circular in nature, patient states she used to pick at her skin  Cranial nerves and coordination are grossly intact  Reflexes are 2+ at biceps triceps brachioradialis  Reflexes are brisk in the lower extremities with the couple beats of clonus.  Intact sensation to light touch and pinprick in upper and lower extremities  Intact vibratory N. position sense and lower extremities  Motor strength 5 over 5 upper and lower extremities  Difficulty with tandem gait mildly Romberg test with some sway but performed adequately  Normal range of motion of neck Full shoulder range of motion noted  Left wrist evaluated no obvious deformity noted.  Well preserve but range of motion at wrist  Some tenderness in the mid wrist posteriorly  Slight tremor/movement noted in index middle and ring finger while patient is at rest  Left knee is evaluated there is no obvious effusion, no crepitus, no abnormal AP or mL instability, no joint line tenderness today. Full range of motion at the knees noted  *RADIOLOGY REPORT*  Clinical Data: Left wrist pain, no known injury  LEFT WRIST - COMPLETE 3+ VIEW  Comparison: None.  Findings: No fracture or dislocation is seen.  The joint spaces are preserved.  Visualized soft tissues are grossly  unremarkable.  IMPRESSION:  No acute osseous abnormality is seen.  Original Report Authenticated By: Charline Bills, M.D.   *RADIOLOGY REPORT*  Clinical Data: Cervicalgia. Diminished range of motion. Upper  extremity pain.  CERVICAL SPINE COMPLETE WITH FLEXION AND EXTENSION VIEWS  Comparison: None.  Findings: Neutral position is normal. Flexion extension does not  show any abnormal motion. There is mild disc space narrowing at C5-  6 with small marginal osteophytes. No apparently significant  osteophytic encroachment upon the canal or foramina. No other  focal lesion.  IMPRESSION:  Normal except for minimal spondylosis C5-6. No abnormal motion.  Original Report Authenticated By: Thomasenia Sales, M.D.    *RADIOLOGY REPORT*  Clinical Data: Low back pain with left leg pain  LUMBAR SPINE - 2-3 VIEW  Comparison: None.  Findings: There is no evidence of lumbar spine fracture.  Alignment is normal. Intervertebral disc spaces are maintained.  Negative for SI joint arthritis.  IMPRESSION:  Negative.         Assessment & Plan:    1. Neck pain Mild C5-6 spndylosis 2.Focal dystonia left middle and ring finger 3. Low back Pain recent in last week   Consider physical therapy for her neck pain,low back pain, education on proper ergonomics, positioning, body mechanics for computer use, sleeping, pain management techniques, core strengthening.   Emphasis on staying active, daily walking, water program at [facility].    No abnormal motion on flexion extension views, minimal C5-6 spondylosis. Previous cervical  MRI report 07/25/11reviewed and result requested to be scanned to e chart. Left wrist radiographs normal    Non narcotic management consult only

## 2012-01-16 NOTE — Patient Instructions (Addendum)
1. Neck pain Mild C5-6 spndylosis 2.Focal dystonia left middle and ring finger 3. Low back Pain( recent in last week)   Consider physical therapy for her neck pain,low back pain, education on proper ergonomics, positioning, body mechanics for computer use, sleeping, pain management techniques, core strengthening.   Emphasis on staying active, daily walking, water program at [facility].

## 2012-01-23 ENCOUNTER — Encounter: Payer: Self-pay | Admitting: Internal Medicine

## 2012-01-23 ENCOUNTER — Ambulatory Visit (INDEPENDENT_AMBULATORY_CARE_PROVIDER_SITE_OTHER): Payer: Managed Care, Other (non HMO) | Admitting: Internal Medicine

## 2012-01-23 VITALS — BP 132/84 | Temp 98.6°F | Wt 187.0 lb

## 2012-01-23 DIAGNOSIS — IMO0001 Reserved for inherently not codable concepts without codable children: Secondary | ICD-10-CM

## 2012-01-23 DIAGNOSIS — M797 Fibromyalgia: Secondary | ICD-10-CM | POA: Insufficient documentation

## 2012-01-23 DIAGNOSIS — G9332 Myalgic encephalomyelitis/chronic fatigue syndrome: Secondary | ICD-10-CM | POA: Insufficient documentation

## 2012-01-23 DIAGNOSIS — R5382 Chronic fatigue, unspecified: Secondary | ICD-10-CM

## 2012-01-23 MED ORDER — PREGABALIN 150 MG PO CAPS: 150.0000 mg | ORAL_CAPSULE | Freq: Two times a day (BID) | ORAL | Status: DC

## 2012-01-23 NOTE — Progress Notes (Signed)
Subjective:    Patient ID: Katie [name], female    DOB: 06/19/1981, 31 y.o.   MRN: [id number]  HPI  31 year old white female with history of unexplained arthralgias/fibromyalgia for followup. Patient was seen by pain management specialist. Multiple x-rays were performed. Specialist encouraged regular exercise program. Patient continues to struggle with unexplained muscle aches and joint pains.  She had some mild improvement with gabapentin but symptoms returned. We switched to Lyrica 75 mg twice daily. No significant relief at this dose.  Patient has had workup for Lyme disease in the past. Neurologist performed lumbar puncture.  Studies were negative for Lyme disease.  She has chronic pain.  Her symptoms are worse with exertion / activity.  Review of Systems  No fever or chills, chronic fatigue  Past Medical History  Diagnosis Date  . Asthma   . Bipolar affective disorder     onogoing Dr Maye Hides treatment for the past 10 years  . GERD (gastroesophageal reflux disease)   . Allergy   . Migraines     teenage onset with periods  . History of recurrent UTIs     cystoscopy with Dr Edwin Cap [facility]  . History of kidney stones     highschool    History   Social History  . Marital Status: Married    Spouse Name: N/A    Number of Children: N/A  . Years of Education: N/A   Occupational History  . preschool teacher and nanny    Social History Main Topics  . Smoking status: Never Smoker   . Smokeless tobacco: Never Used  . Alcohol Use: No  . Drug Use: No  . Sexually Active: Yes    Birth Control/ Protection: Pill   Other Topics Concern  . Not on file   Social History Narrative   [place] prev occupation.  Preschool teacher and nannyNever smoked.  No etoh no drugs    Past Surgical History  Procedure Date  . Adenoidectomy     1990  . Knee surgery     left knee torn cartilage removed 1994  . Hemangioma excision     2009  removal from right ar    Family History  Problem Relation Age of Onset  . Alcohol abuse Paternal Grandmother   . Alcohol abuse Paternal Grandfather   . Lung cancer Paternal Grandfather   . Alcohol abuse Maternal Grandfather   . Prostate cancer Maternal Grandfather   . Osteoarthritis Mother   . Lymphoma Mother     Non-Hodgkin's lymphoma  . Hyperlipidemia Mother   . Hypertension Mother   . Diabetes Mother     type 2  . Osteoarthritis Father   . Other Father     Depression.anxiety  . Lung cancer Maternal Grandmother   . Diabetes Maternal Grandmother   . Diabetes Maternal Aunt     Allergies  Allergen Reactions  . Zonisamide Nausea And Vomiting    Current Outpatient Prescriptions on File Prior to Visit  Medication Sig Dispense Refill  . Fluvoxamine Maleate (LUVOX CR) 100 MG CP24 Take 50 mg by mouth at bedtime.       Marland Kitchen lithium 300 MG tablet Take 300 mg by mouth. Take 3.5 tablets by mouth at bedtime       . Norgestim-Eth Estrad Triphasic (TRI-SPRINTEC) 0.18/0.215/0.25 MG-35 MCG TABS Take by mouth daily.        Marland Kitchen omeprazole (PRILOSEC) 40 MG capsule TAKE ONE CAPSULE BY MOUTH ONE TIME DAILY  30 capsule  2  . oxymetazoline (AFRIN) 0.05 % nasal spray 2 sprays by Nasal route 2 (two) times daily.        . traMADol (ULTRAM) 50 MG tablet Take 50 mg by mouth every 8 (eight) hours as needed.       . traZODone (DESYREL) 100 MG tablet Take 100 mg by mouth at bedtime. Two tabs daily at bedtime.      Marland Kitchen DISCONTD: pregabalin (LYRICA) 75 MG capsule Take 1 capsule (75 mg total) by mouth 2 (two) times daily.  60 capsule  1    BP 132/84  Temp 98.6 F (37 C) (Oral)  Wt 187 lb (84.823 kg)  LMP 12/18/2011       Objective:   Physical Exam  Constitutional: She appears well-developed and well-nourished.  Cardiovascular: Normal rate, regular rhythm and normal heart sounds.   Pulmonary/Chest: Effort normal and breath sounds normal. She has no wheezes.  Musculoskeletal:       Intermittent tremor  or left hand.   Tender points near left thoracolumbar junction.  Psychiatric: She has a normal mood and affect. Her behavior is normal.       Assessment & Plan:

## 2012-01-23 NOTE — Assessment & Plan Note (Signed)
No significant change.  Increase lyrica to 150 mg twice daily.  Reassess in 1 month.

## 2012-01-26 ENCOUNTER — Telehealth: Payer: Self-pay | Admitting: Internal Medicine

## 2012-01-26 ENCOUNTER — Ambulatory Visit: Payer: Managed Care, Other (non HMO) | Admitting: Internal Medicine

## 2012-01-26 NOTE — Telephone Encounter (Signed)
rx called in

## 2012-01-26 NOTE — Telephone Encounter (Signed)
ok 

## 2012-01-26 NOTE — Telephone Encounter (Signed)
[facility] not [facility] pregabalin (LYRICA) 150 MG capsule prescription. Pt requesting to have rx re-called in   [facility]

## 2012-01-27 ENCOUNTER — Other Ambulatory Visit: Payer: Self-pay | Admitting: Family Medicine

## 2012-02-24 ENCOUNTER — Encounter: Payer: Self-pay | Admitting: Internal Medicine

## 2012-02-24 ENCOUNTER — Ambulatory Visit (INDEPENDENT_AMBULATORY_CARE_PROVIDER_SITE_OTHER): Payer: Managed Care, Other (non HMO) | Admitting: Internal Medicine

## 2012-02-24 VITALS — BP 114/80 | HR 76 | Temp 98.8°F | Wt 187.0 lb

## 2012-02-24 DIAGNOSIS — M797 Fibromyalgia: Secondary | ICD-10-CM

## 2012-02-24 DIAGNOSIS — IMO0001 Reserved for inherently not codable concepts without codable children: Secondary | ICD-10-CM

## 2012-02-24 MED ORDER — GABAPENTIN 300 MG PO CAPS: 300.0000 mg | ORAL_CAPSULE | Freq: Three times a day (TID) | ORAL | Status: DC

## 2012-02-24 NOTE — Progress Notes (Signed)
Subjective:    Patient ID: Katie [name], female    DOB: April 13, 1981, 31 y.o.   MRN: [id number]  HPI  31 year old white female with history of fibromyalgia and chronic fatigue syndrome for followup. At previous visit Lyrica was increased to 150 mg twice daily. She has not noticed significant improvement in her pain. She feels she had better response to gabapentin.  She continues to have intermittent joint pains. She is currently having bilateral wrist pain. She describes a prickly sensation.  Last week she had exacerbation of back pain which has resolved on its own.  Review of Systems Sleep quality is normal  Past Medical History  Diagnosis Date  . Asthma   . Bipolar affective disorder     onogoing Dr Maye Hides treatment for the past 10 years  . GERD (gastroesophageal reflux disease)   . Allergy   . Migraines     teenage onset with periods  . History of recurrent UTIs     cystoscopy with Dr Edwin Cap [facility]  . History of kidney stones     highschool    History   Social History  . Marital Status: Married    Spouse Name: N/A    Number of Children: N/A  . Years of Education: N/A   Occupational History  . preschool teacher and nanny    Social History Main Topics  . Smoking status: Never Smoker   . Smokeless tobacco: Never Used  . Alcohol Use: No  . Drug Use: No  . Sexually Active: Yes    Birth Control/ Protection: Pill   Other Topics Concern  . Not on file   Social History Narrative   [place] prev occupation.  Preschool teacher and nannyNever smoked.  No etoh no drugs    Past Surgical History  Procedure Date  . Adenoidectomy     1990  . Knee surgery     left knee torn cartilage removed 1994  . Hemangioma excision     2009 removal from right ar    Family History  Problem Relation Age of Onset  . Alcohol abuse Paternal Grandmother   . Alcohol abuse Paternal Grandfather   . Lung cancer Paternal Grandfather     . Alcohol abuse Maternal Grandfather   . Prostate cancer Maternal Grandfather   . Osteoarthritis Mother   . Lymphoma Mother     Non-Hodgkin's lymphoma  . Hyperlipidemia Mother   . Hypertension Mother   . Diabetes Mother     type 2  . Osteoarthritis Father   . Other Father     Depression.anxiety  . Lung cancer Maternal Grandmother   . Diabetes Maternal Grandmother   . Diabetes Maternal Aunt     Allergies  Allergen Reactions  . Zonisamide Nausea And Vomiting    Current Outpatient Prescriptions on File Prior to Visit  Medication Sig Dispense Refill  . Fluvoxamine Maleate (LUVOX CR) 100 MG CP24 Take 50 mg by mouth at bedtime.       Marland Kitchen lithium 300 MG tablet Take 300 mg by mouth. Take 3.5 tablets by mouth at bedtime       . Norgestim-Eth Estrad Triphasic (TRI-SPRINTEC) 0.18/0.215/0.25 MG-35 MCG TABS Take by mouth daily.        Marland Kitchen omeprazole (PRILOSEC) 40 MG capsule TAKE ONE CAPSULE BY MOUTH ONE TIME DAILY  30 capsule  1  . oxymetazoline (AFRIN) 0.05 % nasal spray 2 sprays by Nasal route 2 (two) times daily.        Marland Kitchen  traMADol (ULTRAM) 50 MG tablet Take 50 mg by mouth every 8 (eight) hours as needed.       . traZODone (DESYREL) 100 MG tablet Take 100 mg by mouth at bedtime. Two tabs daily at bedtime.        BP 114/80  Pulse 76  Temp 98.8 F (37.1 C) (Oral)  Wt 187 lb (84.823 kg)       Objective:   Physical Exam  Constitutional: She appears well-developed and well-nourished.  Cardiovascular: Normal rate, regular rhythm and normal heart sounds.   Pulmonary/Chest: Effort normal and breath sounds normal. She has no wheezes.  Musculoskeletal:       No joint swelling or redness. Mild discomfort with flexion and extension of bilateral wrist.  Skin: Skin is warm and dry.  Psychiatric: She has a normal mood and affect. Her behavior is normal.          Assessment & Plan:

## 2012-02-24 NOTE — Assessment & Plan Note (Signed)
Poor response to Lyrica 150 mg twice daily. Switch to gabapentin 300 mg 3 times a day

## 2012-03-06 ENCOUNTER — Other Ambulatory Visit: Payer: Self-pay | Admitting: Internal Medicine

## 2012-03-26 ENCOUNTER — Encounter: Payer: Self-pay | Admitting: Internal Medicine

## 2012-03-26 ENCOUNTER — Ambulatory Visit (INDEPENDENT_AMBULATORY_CARE_PROVIDER_SITE_OTHER): Payer: Managed Care, Other (non HMO) | Admitting: Internal Medicine

## 2012-03-26 VITALS — BP 116/74 | HR 91 | Temp 98.4°F | Wt 186.0 lb

## 2012-03-26 DIAGNOSIS — IMO0001 Reserved for inherently not codable concepts without codable children: Secondary | ICD-10-CM

## 2012-03-26 DIAGNOSIS — M797 Fibromyalgia: Secondary | ICD-10-CM

## 2012-03-26 DIAGNOSIS — M2550 Pain in unspecified joint: Secondary | ICD-10-CM

## 2012-03-26 LAB — BASIC METABOLIC PANEL
BUN: 12 mg/dL (ref 6–23)
Calcium: 9.8 mg/dL (ref 8.4–10.5)
Chloride: 105 mEq/L (ref 96–112)
Creatinine, Ser: 0.9 mg/dL (ref 0.4–1.2)

## 2012-03-26 LAB — CBC WITH DIFFERENTIAL/PLATELET
Eosinophils Absolute: 0.1 10*3/uL (ref 0.0–0.7)
Eosinophils Relative: 1.4 % (ref 0.0–5.0)
Lymphocytes Relative: 19.2 % (ref 12.0–46.0)
MCHC: 32.9 g/dL (ref 30.0–36.0)
MCV: 85.5 fl (ref 78.0–100.0)
Monocytes Absolute: 0.5 10*3/uL (ref 0.1–1.0)
Neutrophils Relative %: 73.9 % (ref 43.0–77.0)
Platelets: 322 10*3/uL (ref 150.0–400.0)
RBC: 4.26 Mil/uL (ref 3.87–5.11)
WBC: 10.3 10*3/uL (ref 4.5–10.5)

## 2012-03-26 LAB — HEPATIC FUNCTION PANEL
Alkaline Phosphatase: 71 U/L (ref 39–117)
Bilirubin, Direct: 0 mg/dL (ref 0.0–0.3)
Total Bilirubin: 0.2 mg/dL — ABNORMAL LOW (ref 0.3–1.2)

## 2012-03-26 MED ORDER — OMEPRAZOLE 40 MG PO CPDR: 40.0000 mg | DELAYED_RELEASE_CAPSULE | Freq: Every day | ORAL | Status: DC

## 2012-03-26 NOTE — Patient Instructions (Addendum)
Start regular exercise program (aqua therapy/pool exercises)

## 2012-03-26 NOTE — Progress Notes (Signed)
Subjective:    Patient ID: Katie [name], female    DOB: 05-13-1981, 31 y.o.   MRN: [id number]  HPI  31 year old white female with history of fibromyalgia and chronic fatigue syndrome for followup. Overall patient reports her severe joint pains have improved. She is tolerating gabapentin 300 mg 3 times a day. She does report persistent heavy sensation in her arms and legs.  Patient also still struggling with fatigue especially in the afternoon. She often takes an afternoon nap.   Review of Systems No change in weight  Past Medical History  Diagnosis Date  . Asthma   . Bipolar affective disorder     onogoing Dr Maye Hides treatment for the past 10 years  . GERD (gastroesophageal reflux disease)   . Allergy   . Migraines     teenage onset with periods  . History of recurrent UTIs     cystoscopy with Dr Edwin Cap [facility]  . History of kidney stones     highschool    History   Social History  . Marital Status: Married    Spouse Name: N/A    Number of Children: N/A  . Years of Education: N/A   Occupational History  . preschool teacher and nanny    Social History Main Topics  . Smoking status: Never Smoker   . Smokeless tobacco: Never Used  . Alcohol Use: No  . Drug Use: No  . Sexually Active: Yes    Birth Control/ Protection: Pill   Other Topics Concern  . Not on file   Social History Narrative   [place] - prev occupation.  Preschool teacher and nannyNever smoked.  No etoh no drugs    Past Surgical History  Procedure Date  . Adenoidectomy     1990  . Knee surgery     left knee torn cartilage removed 1994  . Hemangioma excision     2009 removal from right ar    Family History  Problem Relation Age of Onset  . Alcohol abuse Paternal Grandmother   . Alcohol abuse Paternal Grandfather   . Lung cancer Paternal Grandfather   . Alcohol abuse Maternal Grandfather   . Prostate cancer Maternal Grandfather   .  Osteoarthritis Mother   . Lymphoma Mother     Non-Hodgkin's lymphoma  . Hyperlipidemia Mother   . Hypertension Mother   . Diabetes Mother     type 2  . Osteoarthritis Father   . Other Father     Depression.anxiety  . Lung cancer Maternal Grandmother   . Diabetes Maternal Grandmother   . Diabetes Maternal Aunt     Allergies  Allergen Reactions  . Zonisamide Nausea And Vomiting    Current Outpatient Prescriptions on File Prior to Visit  Medication Sig Dispense Refill  . Fluvoxamine Maleate (LUVOX CR) 100 MG CP24 Take 50 mg by mouth at bedtime.       . gabapentin (NEURONTIN) 300 MG capsule Take 1 capsule (300 mg total) by mouth 3 (three) times daily.  90 capsule  3  . lithium 300 MG tablet Take 300 mg by mouth. Take 3.5 tablets by mouth at bedtime       . Norgestim-Eth Estrad Triphasic (TRI-SPRINTEC) 0.18/0.215/0.25 MG-35 MCG TABS Take by mouth daily.        . traMADol (ULTRAM) 50 MG tablet TAKE ONE TABLET BY MOUTH EVERY EIGHT HOURS AS NEEDED FOR PAIN  60 tablet  0  . traZODone (DESYREL) 100 MG tablet  Take 100 mg by mouth at bedtime. Two tabs daily at bedtime.      Marland Kitchen DISCONTD: omeprazole (PRILOSEC) 40 MG capsule TAKE ONE CAPSULE BY MOUTH ONE TIME DAILY  30 capsule  1    BP 116/74  Pulse 91  Temp 98.4 F (36.9 C) (Oral)  Wt 186 lb (84.369 kg)  SpO2 96%       Objective:   Physical Exam  Constitutional: She is oriented to person, place, and time. She appears well-developed and well-nourished.  Cardiovascular: Normal rate, regular rhythm and normal heart sounds.   Pulmonary/Chest: Effort normal and breath sounds normal. She has no wheezes.  Musculoskeletal: Normal range of motion. She exhibits no tenderness.  Neurological: She is alert and oriented to person, place, and time.  Psychiatric: She has a normal mood and affect. Her behavior is normal.        Assessment & Plan:

## 2012-03-26 NOTE — Assessment & Plan Note (Signed)
Improved with gabapentin. Continue 300 mg 3 times a day. Suggested she start regular exercise regimen - water aerobics / aqua therapy.

## 2012-04-06 ENCOUNTER — Encounter: Payer: Self-pay | Admitting: Internal Medicine

## 2012-04-21 ENCOUNTER — Other Ambulatory Visit: Payer: Self-pay | Admitting: Internal Medicine

## 2012-05-12 ENCOUNTER — Ambulatory Visit (INDEPENDENT_AMBULATORY_CARE_PROVIDER_SITE_OTHER): Payer: Managed Care, Other (non HMO) | Admitting: Internal Medicine

## 2012-05-12 ENCOUNTER — Encounter: Payer: Self-pay | Admitting: Internal Medicine

## 2012-05-12 VITALS — BP 122/80 | HR 120 | Temp 98.4°F | Wt 184.0 lb

## 2012-05-12 DIAGNOSIS — IMO0001 Reserved for inherently not codable concepts without codable children: Secondary | ICD-10-CM

## 2012-05-12 DIAGNOSIS — M797 Fibromyalgia: Secondary | ICD-10-CM

## 2012-05-12 DIAGNOSIS — J069 Acute upper respiratory infection, unspecified: Secondary | ICD-10-CM | POA: Insufficient documentation

## 2012-05-12 NOTE — Assessment & Plan Note (Addendum)
Fibromyalgia symptoms significantly worse since getting upper respiratory infection. Use tramadol every 8 hours as needed. Patient will try Meriva formulation of curcumin for fibromyalgia symptoms.  Patient to take 1000 mg twice daily.  Reassess in 1 month.

## 2012-05-12 NOTE — Patient Instructions (Addendum)
Use tramadol as directed Use mucinex DM for cough every 12 hrs as needed Increase fluid intake. Patient advised to call office if symptoms persist or worsen.

## 2012-05-12 NOTE — Assessment & Plan Note (Signed)
31 year old white female with signs and symptoms of viral upper respiratory infection. I suggested symptomatic treatment.  Patient advised to call office if symptoms persist or worsen.

## 2012-05-12 NOTE — Progress Notes (Signed)
Subjective:    Patient ID: Katie [name], female    DOB: [birth year], 31 y.o.   MRN: [id number]  HPI  31 year old white female with history of fibromyalgia and chronic fatigue syndrome complains of cough, sore throat and nasal congestion for last 1-2 weeks. Patient reports her fibromyalgia symptoms have significantly worsened since getting upper respiratory infection. She denies any fever or chills. She denies any shortness of breath. Her nasal drainage is not purulent.  Review of Systems See history of present illness    Past Medical History  Diagnosis Date  . Asthma   . Bipolar affective disorder     onogoing Dr Maye Hides treatment for the past 10 years  . GERD (gastroesophageal reflux disease)   . Allergy   . Migraines     teenage onset with periods  . History of recurrent UTIs     cystoscopy with Dr Edwin Cap [facility]  . History of kidney stones     highschool    History   Social History  . Marital Status: Married    Spouse Name: N/A    Number of Children: N/A  . Years of Education: N/A   Occupational History  . preschool teacher and nanny    Social History Main Topics  . Smoking status: Never Smoker   . Smokeless tobacco: Never Used  . Alcohol Use: No  . Drug Use: No  . Sexually Active: Yes    Birth Control/ Protection: Pill   Other Topics Concern  . Not on file   Social History Narrative   [place] prev occupation.  Preschool teacher and nannyNever smoked.  No etoh no drugs    Past Surgical History  Procedure Date  . Adenoidectomy     1990  . Knee surgery     left knee torn cartilage removed 1994  . Hemangioma excision     2009 removal from right ar    Family History  Problem Relation Age of Onset  . Alcohol abuse Paternal Grandmother   . Alcohol abuse Paternal Grandfather   . Lung cancer Paternal Grandfather   . Alcohol abuse Maternal Grandfather   . Prostate cancer Maternal Grandfather   .  Osteoarthritis Mother   . Lymphoma Mother     Non-Hodgkin's lymphoma  . Hyperlipidemia Mother   . Hypertension Mother   . Diabetes Mother     type 2  . Osteoarthritis Father   . Other Father     Depression.anxiety  . Lung cancer Maternal Grandmother   . Diabetes Maternal Grandmother   . Diabetes Maternal Aunt     Allergies  Allergen Reactions  . Zonisamide Nausea And Vomiting    Current Outpatient Prescriptions on File Prior to Visit  Medication Sig Dispense Refill  . ASTRAGALUS PO Take 1,410 mg by mouth daily. 3 tabs once daily      . Cholecalciferol (VITAMIN D3) 2000 UNITS TABS Take 1 tablet by mouth daily.      . Fluvoxamine Maleate (LUVOX CR) 100 MG CP24 Take 100 mg by mouth 2 (two) times daily.       Marland Kitchen gabapentin (NEURONTIN) 300 MG capsule Take 1 capsule (300 mg total) by mouth 3 (three) times daily.  90 capsule  3  . lithium 300 MG tablet Take 300 mg by mouth. Take 3.5 tablets by mouth at bedtime       . Norgestim-Eth Estrad Triphasic (TRI-SPRINTEC) 0.18/0.215/0.25 MG-35 MCG TABS Take by mouth daily.        Marland Kitchen  omeprazole (PRILOSEC) 40 MG capsule Take 1 capsule (40 mg total) by mouth daily.  90 capsule  1  . traMADol (ULTRAM) 50 MG tablet TAKE ONE TABLET BY MOUTH EVERY EIGHT HOURS AS NEEDED FOR PAIN  60 tablet  5  . traZODone (DESYREL) 100 MG tablet Take 100 mg by mouth at bedtime. Two tabs daily at bedtime.        BP 122/80  Pulse 120  Temp 98.4 F (36.9 C) (Oral)  Wt 184 lb (83.462 kg)  SpO2 97%    Objective:   Physical Exam  Constitutional: She is oriented to person, place, and time. She appears well-developed and well-nourished.  HENT:  Head: Normocephalic and atraumatic.  Left Ear: External ear normal.       Right tympanic membrane slightly retracted Oral pharyngeal erythema without exudates  Cardiovascular: Normal rate, regular rhythm and normal heart sounds.   Pulmonary/Chest: Effort normal and breath sounds normal. She has no wheezes.  Neurological: She  is alert and oriented to person, place, and time.          Assessment & Plan:

## 2012-05-15 ENCOUNTER — Encounter: Payer: Self-pay | Admitting: Internal Medicine

## 2012-05-26 ENCOUNTER — Ambulatory Visit: Payer: Managed Care, Other (non HMO) | Admitting: Internal Medicine

## 2012-05-26 ENCOUNTER — Telehealth: Payer: Self-pay | Admitting: *Deleted

## 2012-05-26 NOTE — Telephone Encounter (Signed)
Pt has social security hearing next,so pt unsure exactly the title, but she said it can be addressed to social security disability.  It should include dx and difficulty she has with day to day living and ambulation

## 2012-05-26 NOTE — Telephone Encounter (Signed)
Dr  Artist Pais wrote letter and it is up front ready for pick up. Pt is aware

## 2012-05-26 NOTE — Telephone Encounter (Signed)
Pt stated that you were going to type a letter with her diagnosis and condition on the fibromyalgia.  Please advise

## 2012-05-26 NOTE — Telephone Encounter (Signed)
Please find out who the letter needs to addressed to

## 2012-06-14 ENCOUNTER — Encounter: Payer: Self-pay | Admitting: Internal Medicine

## 2012-06-14 ENCOUNTER — Ambulatory Visit (INDEPENDENT_AMBULATORY_CARE_PROVIDER_SITE_OTHER): Payer: Managed Care, Other (non HMO) | Admitting: Internal Medicine

## 2012-06-14 VITALS — BP 112/64 | Temp 99.2°F | Wt 184.0 lb

## 2012-06-14 DIAGNOSIS — IMO0001 Reserved for inherently not codable concepts without codable children: Secondary | ICD-10-CM

## 2012-06-14 DIAGNOSIS — M797 Fibromyalgia: Secondary | ICD-10-CM

## 2012-06-14 DIAGNOSIS — M2550 Pain in unspecified joint: Secondary | ICD-10-CM

## 2012-06-14 NOTE — Assessment & Plan Note (Signed)
31 year old with chronic fibromyalgia. No significant improvement. 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 We discussed possibility that some of her joint symptoms and fatigue symptoms may be secondary to parvovirus infection. Obtain parvovirus titers.  We discussed case reports of a rheumatologist using IVIG to treat similar patient's with fibromyalgia after parvovirus infection.

## 2012-06-14 NOTE — Patient Instructions (Addendum)
Our office will contact you re: blood test results 

## 2012-06-14 NOTE — Progress Notes (Signed)
Subjective:    Patient ID: Katie [name], female    DOB: [birth year], 31 y.o.   MRN: [id number]  HPI  31 year old white female for followup regarding fibromyalgia. Patient continues to struggle with chronic musculoskeletal pain. Her symptoms are particularly worse across her shoulders, forearms and wrists. She also struggles with chronic fatigue and lack of energy. Patient reports her symptoms started approximately 3 years ago. 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   Review of Systems Negative for fever or chills  Past Medical History  Diagnosis Date  . Asthma   . Bipolar affective disorder     onogoing Dr Maye Hides treatment for the past 10 years  . GERD (gastroesophageal reflux disease)   . Allergy   . Migraines     teenage onset with periods  . History of recurrent UTIs     cystoscopy with Dr Edwin Cap [facility]  . History of kidney stones     highschool    History   Social History  . Marital Status: Married    Spouse Name: N/A    Number of Children: N/A  . Years of Education: N/A   Occupational History  . preschool teacher and nanny    Social History Main Topics  . Smoking status: Never Smoker   . Smokeless tobacco: Never Used  . Alcohol Use: No  . Drug Use: No  . Sexually Active: Yes    Birth Control/ Protection: Pill   Other Topics Concern  . Not on file   Social History Narrative   [place] prev occupation.  Preschool teacher and nannyNever smoked.  No etoh no drugs    Past Surgical History  Procedure Date  . Adenoidectomy     1990  . Knee surgery     left knee torn cartilage removed 1994  . Hemangioma excision     2009 removal from right ar    Family History  Problem Relation Age of Onset  . Alcohol abuse Paternal Grandmother   . Alcohol abuse Paternal Grandfather   . Lung cancer Paternal Grandfather   . Alcohol abuse Maternal Grandfather   . Prostate cancer Maternal  Grandfather   . Osteoarthritis Mother   . Lymphoma Mother     Non-Hodgkin's lymphoma  . Hyperlipidemia Mother   . Hypertension Mother   . Diabetes Mother     type 2  . Osteoarthritis Father   . Other Father     Depression.anxiety  . Lung cancer Maternal Grandmother   . Diabetes Maternal Grandmother   . Diabetes Maternal Aunt     Allergies  Allergen Reactions  . Zonisamide Nausea And Vomiting    Current Outpatient Prescriptions on File Prior to Visit  Medication Sig Dispense Refill  . ASTRAGALUS PO Take 1,410 mg by mouth daily. 3 tabs once daily      . Cholecalciferol (VITAMIN D3) 2000 UNITS TABS Take 1 tablet by mouth daily.      . Fluvoxamine Maleate (LUVOX CR) 100 MG CP24 Take 100 mg by mouth 2 (two) times daily.       Marland Kitchen gabapentin (NEURONTIN) 300 MG capsule Take 1 capsule (300 mg total) by mouth 3 (three) times daily.  90 capsule  3  . lithium 300 MG tablet Take 300 mg by mouth. Take 3.5 tablets by mouth at bedtime       . Norgestim-Eth Estrad Triphasic (TRI-SPRINTEC) 0.18/0.215/0.25 MG-35 MCG TABS Take by mouth daily.        Marland Kitchen  omeprazole (PRILOSEC) 40 MG capsule Take 1 capsule (40 mg total) by mouth daily.  90 capsule  1  . traMADol (ULTRAM) 50 MG tablet TAKE ONE TABLET BY MOUTH EVERY EIGHT HOURS AS NEEDED FOR PAIN  60 tablet  5  . traZODone (DESYREL) 100 MG tablet Take 100 mg by mouth at bedtime. Two tabs daily at bedtime.        BP 112/64  Temp 99.2 F (37.3 C) (Oral)  Wt 184 lb (83.462 kg)       Objective:   Physical Exam  Constitutional: She is oriented to person, place, and time. She appears well-developed and well-nourished.  Cardiovascular: Normal rate, regular rhythm and normal heart sounds.   Pulmonary/Chest: Effort normal and breath sounds normal. She has no wheezes.  Neurological: She is alert and oriented to person, place, and time.  Psychiatric: She has a normal mood and affect. Her behavior is normal.          Assessment & Plan:

## 2012-06-17 LAB — PARVOVIRUS B19 ANTIBODY, IGG AND IGM: Parovirus B19 IgM Abs: 0.1 index (ref <0.9)

## 2012-06-23 ENCOUNTER — Ambulatory Visit: Payer: Managed Care, Other (non HMO) | Admitting: Physical Therapy

## 2012-07-12 ENCOUNTER — Ambulatory Visit: Payer: Managed Care, Other (non HMO)

## 2012-08-14 ENCOUNTER — Other Ambulatory Visit: Payer: Self-pay | Admitting: Internal Medicine

## 2012-08-16 ENCOUNTER — Encounter: Payer: Self-pay | Admitting: Internal Medicine

## 2012-08-16 ENCOUNTER — Ambulatory Visit (INDEPENDENT_AMBULATORY_CARE_PROVIDER_SITE_OTHER): Payer: Managed Care, Other (non HMO) | Admitting: Internal Medicine

## 2012-08-16 VITALS — BP 114/80 | HR 68 | Temp 99.4°F | Wt 183.0 lb

## 2012-08-16 DIAGNOSIS — R221 Localized swelling, mass and lump, neck: Secondary | ICD-10-CM | POA: Insufficient documentation

## 2012-08-16 DIAGNOSIS — M797 Fibromyalgia: Secondary | ICD-10-CM

## 2012-08-16 DIAGNOSIS — R220 Localized swelling, mass and lump, head: Secondary | ICD-10-CM

## 2012-08-16 DIAGNOSIS — IMO0001 Reserved for inherently not codable concepts without codable children: Secondary | ICD-10-CM

## 2012-08-16 DIAGNOSIS — E049 Nontoxic goiter, unspecified: Secondary | ICD-10-CM | POA: Insufficient documentation

## 2012-08-16 MED ORDER — TRAMADOL HCL 50 MG PO TABS: 50.0000 mg | ORAL_TABLET | Freq: Three times a day (TID) | ORAL | Status: DC | PRN

## 2012-08-16 MED ORDER — METHOCARBAMOL 500 MG PO TABS: 500.0000 mg | ORAL_TABLET | Freq: Three times a day (TID) | ORAL | Status: DC | PRN

## 2012-08-16 MED ORDER — GABAPENTIN 400 MG PO CAPS: 400.0000 mg | ORAL_CAPSULE | Freq: Three times a day (TID) | ORAL | Status: DC

## 2012-08-16 NOTE — Assessment & Plan Note (Signed)
Patient complains of lower neck fullness. On exam - right side seems to be slightly full. Rule out thyroid nodule/goiter. Obtain neck ultrasound.

## 2012-08-16 NOTE — Progress Notes (Signed)
Subjective:    Patient ID: Katie [name], female    DOB: [birth year], 32 y.o.   MRN: [id number]  HPI  32 year old white female for followup regarding fibromyalgia and chronic fatigue syndrome.  Patient notes exacerbation of bilateral forearm/arm pain over last 2-3 days. She describes cramping sensation and pain in her forearms. Her symptoms seem to be triggered by strenuous activity. She finds it hard to hold objects or use her arms to drive (hold steering wheel).  Patient was evaluated by urologist 2 years ago. Evaluation included conduction study. She reports that can affect was inclusive.  Patient's sleep quality has been worse than usual.  Patient also complains of fullness in her lower neck. Her symptoms seem to be worse on her right side.  She denies neck tenderness.    Review of Systems Intermittent tingling in her fingertips bilaterally  Past Medical History  Diagnosis Date  . Asthma   . Bipolar affective disorder     onogoing Dr Maye Hides treatment for the past 10 years  . GERD (gastroesophageal reflux disease)   . Allergy   . Migraines     teenage onset with periods  . History of recurrent UTIs     cystoscopy with Dr Edwin Cap [facility]  . History of kidney stones     highschool    History   Social History  . Marital Status: Married    Spouse Name: N/A    Number of Children: N/A  . Years of Education: N/A   Occupational History  . preschool teacher and nanny    Social History Main Topics  . Smoking status: Never Smoker   . Smokeless tobacco: Never Used  . Alcohol Use: No  . Drug Use: No  . Sexually Active: Yes    Birth Control/ Protection: Pill   Other Topics Concern  . Not on file   Social History Narrative   [place] prev occupation.  Preschool teacher and nannyNever smoked.  No etoh no drugs    Past Surgical History  Procedure Date  . Adenoidectomy     1990  . Knee surgery     left knee torn cartilage  removed 1994  . Hemangioma excision     2009 removal from right ar    Family History  Problem Relation Age of Onset  . Alcohol abuse Paternal Grandmother   . Alcohol abuse Paternal Grandfather   . Lung cancer Paternal Grandfather   . Alcohol abuse Maternal Grandfather   . Prostate cancer Maternal Grandfather   . Osteoarthritis Mother   . Lymphoma Mother     Non-Hodgkin's lymphoma  . Hyperlipidemia Mother   . Hypertension Mother   . Diabetes Mother     type 2  . Osteoarthritis Father   . Other Father     Depression.anxiety  . Lung cancer Maternal Grandmother   . Diabetes Maternal Grandmother   . Diabetes Maternal Aunt     Allergies  Allergen Reactions  . Zonisamide Nausea And Vomiting    Current Outpatient Prescriptions on File Prior to Visit  Medication Sig Dispense Refill  . ASTRAGALUS PO Take 1,410 mg by mouth daily. 3 tabs once daily      . Cholecalciferol (VITAMIN D3) 2000 UNITS TABS Take 1 tablet by mouth daily.      . Fluvoxamine Maleate (LUVOX CR) 100 MG CP24 Take 100 mg by mouth 2 (two) times daily.       . hydrOXYzine (ATARAX/VISTARIL) 25 MG  tablet Take 2 tablets by mouth At bedtime.      Marland Kitchen lithium 300 MG tablet Take 300 mg by mouth. Take 3.5 tablets by mouth at bedtime       . Norgestim-Eth Estrad Triphasic (TRI-SPRINTEC) 0.18/0.215/0.25 MG-35 MCG TABS Take by mouth daily.        Marland Kitchen omeprazole (PRILOSEC) 40 MG capsule Take 1 capsule (40 mg total) by mouth daily.  90 capsule  1  . traZODone (DESYREL) 100 MG tablet Take 100 mg by mouth at bedtime. Two tabs daily at bedtime.        BP 114/80  Pulse 68  Temp 99.4 F (37.4 C) (Oral)  Wt 183 lb (83.008 kg)       Objective:   Physical Exam  Constitutional: She is oriented to person, place, and time. She appears well-developed and well-nourished.  Neck:       Mild fullness of right lower neck  Cardiovascular: Normal rate, regular rhythm and normal heart sounds.   No murmur heard. Pulmonary/Chest: Effort  normal and breath sounds normal. She has no wheezes.  Musculoskeletal:       Mild bilateral forearm tenderness  Neurological: She is alert and oriented to person, place, and time. No cranial nerve deficit.  Psychiatric: She has a normal mood and affect. Her behavior is normal.          Assessment & Plan:

## 2012-08-16 NOTE — Assessment & Plan Note (Signed)
I discussed case with with local rheumatologist-Dr. Dierdre Forth. Patient not candidate for IVIG therapy. Patient currently experiencing exacerbation of bilateral forearm pain. Previous nerve conduction studies were inconclusive. I suspect symptoms are secondary to her fibromyalgia syndrome. Increase Neurontin to 400 mg 3 times a day. Trial of Robaxin 500 mg 3 times a day when necessary. Continue to use tramadol 50 mg as needed.  Reassess in 1 month

## 2012-08-28 ENCOUNTER — Other Ambulatory Visit: Payer: Self-pay

## 2012-09-07 ENCOUNTER — Other Ambulatory Visit: Payer: Managed Care, Other (non HMO)

## 2012-09-13 ENCOUNTER — Ambulatory Visit: Payer: Managed Care, Other (non HMO) | Admitting: Internal Medicine

## 2012-10-20 ENCOUNTER — Encounter: Payer: Self-pay | Admitting: Internal Medicine

## 2012-10-20 ENCOUNTER — Ambulatory Visit (INDEPENDENT_AMBULATORY_CARE_PROVIDER_SITE_OTHER): Payer: Managed Care, Other (non HMO) | Admitting: Internal Medicine

## 2012-10-20 VITALS — BP 122/74 | HR 80 | Temp 98.7°F | Wt 186.0 lb

## 2012-10-20 DIAGNOSIS — M2550 Pain in unspecified joint: Secondary | ICD-10-CM

## 2012-10-20 DIAGNOSIS — IMO0001 Reserved for inherently not codable concepts without codable children: Secondary | ICD-10-CM

## 2012-10-20 DIAGNOSIS — M797 Fibromyalgia: Secondary | ICD-10-CM

## 2012-10-20 DIAGNOSIS — R5382 Chronic fatigue, unspecified: Secondary | ICD-10-CM

## 2012-10-20 DIAGNOSIS — G9332 Myalgic encephalomyelitis/chronic fatigue syndrome: Secondary | ICD-10-CM

## 2012-10-20 MED ORDER — GABAPENTIN 300 MG PO CAPS: 300.0000 mg | ORAL_CAPSULE | Freq: Three times a day (TID) | ORAL | Status: DC

## 2012-10-20 MED ORDER — CLONAZEPAM 1 MG PO TABS: 1.0000 mg | ORAL_TABLET | Freq: Two times a day (BID) | ORAL | Status: DC | PRN

## 2012-10-20 MED ORDER — TRAMADOL HCL 50 MG PO TABS: 50.0000 mg | ORAL_TABLET | Freq: Three times a day (TID) | ORAL | Status: DC | PRN

## 2012-10-20 NOTE — Patient Instructions (Signed)
Myalgic Encephalomyelitis Dr. Braulio Bosch [facility]

## 2012-10-20 NOTE — Assessment & Plan Note (Signed)
32 year old white female has myriad of unexplained muscular skeletal complaints, disabling fatigue and post exertional weakness/pain. Her symptoms consistent with myalgic encephalomyelitis.  Check HSV 6 antibodies.  Patient advised to seek out information re: Dr. Braulio Bosch [facility]) and his research in treating ME.  Patient encouraged to pace her activities.  Discontinue trazodone and hydroxyzine. Use clonazepam 1 mg at bedtime as needed for improved sleep. Gabapentin 400 mg  contributing to "brain fog". Decreased to 300 mg 3 times a day.  Reassess in 1 month.

## 2012-10-20 NOTE — Progress Notes (Signed)
Subjective:    Patient ID: Katie [name], female    DOB: 1980/09/12, 32 y.o.   MRN: [id number]  HPI  32 year old white female with fibromyalgia and chronic fatigue syndrome for followup. Over the past 2-3 weeks patient has experienced exacerbation. Her symptoms particularly worse in her upper extremitys. Patient also describes shooting pain in her left leg if she stands for a prolonged period of time (i.e. washing dishes).  Her symptoms are exacerbated by minimal physical activity.  She reports sleep quality is poor.  She currently takes trazodone and Hydroxyzine.   Review of Systems "whole body feels heavy",  She also plans to establish with new psychiatrist in [place]  Past Medical History  Diagnosis Date  . Asthma   . Bipolar affective disorder     onogoing Dr Maye Hides treatment for the past 10 years  . GERD (gastroesophageal reflux disease)   . Allergy   . Migraines     teenage onset with periods  . History of recurrent UTIs     cystoscopy with Dr Edwin Cap [facility]  . History of kidney stones     highschool    History   Social History  . Marital Status: Married    Spouse Name: N/A    Number of Children: N/A  . Years of Education: N/A   Occupational History  . preschool teacher and nanny    Social History Main Topics  . Smoking status: Never Smoker   . Smokeless tobacco: Never Used  . Alcohol Use: No  . Drug Use: No  . Sexually Active: Yes    Birth Control/ Protection: Pill   Other Topics Concern  . Not on file   Social History Narrative   Grew up in [place]   Married 5 yrs   Occupation - prev occupation.  Preschool teacher and nanny   Never smoked.  No etoh no drugs    Past Surgical History  Procedure Laterality Date  . Adenoidectomy      1990  . Knee surgery      left knee torn cartilage removed 1994  . Hemangioma excision      2009 removal from right ar    Family History  Problem Relation Age of Onset  . Alcohol abuse Paternal  Grandmother   . Alcohol abuse Paternal Grandfather   . Lung cancer Paternal Grandfather   . Alcohol abuse Maternal Grandfather   . Prostate cancer Maternal Grandfather   . Osteoarthritis Mother   . Lymphoma Mother     Non-Hodgkin's lymphoma  . Hyperlipidemia Mother   . Hypertension Mother   . Diabetes Mother     type 2  . Osteoarthritis Father   . Other Father     Depression.anxiety  . Lung cancer Maternal Grandmother   . Diabetes Maternal Grandmother   . Diabetes Maternal Aunt     Allergies  Allergen Reactions  . Zonisamide Nausea And Vomiting    Current Outpatient Prescriptions on File Prior to Visit  Medication Sig Dispense Refill  . Cholecalciferol (VITAMIN D3) 2000 UNITS TABS Take 1 tablet by mouth daily.      . Fluvoxamine Maleate (LUVOX CR) 100 MG CP24 Take 100 mg by mouth 2 (two) times daily.       Marland Kitchen lithium 300 MG tablet Take 300 mg by mouth. Take 3.5 tablets by mouth at bedtime       . Norgestim-Eth Estrad Triphasic (TRI-SPRINTEC) 0.18/0.215/0.25 MG-35 MCG TABS Take by mouth daily.        Marland Kitchen  omeprazole (PRILOSEC) 40 MG capsule Take 1 capsule (40 mg total) by mouth daily.  90 capsule  1   No current facility-administered medications on file prior to visit.    BP 122/74  Pulse 80  Temp(Src) 98.7 F (37.1 C) (Oral)  Wt 186 lb (84.369 kg)  BMI 31.91 kg/m2       Objective:   Physical Exam  Constitutional: She is oriented to person, place, and time. She appears well-developed and well-nourished.  Cardiovascular: Normal rate and normal heart sounds.   Pulmonary/Chest: Effort normal and breath sounds normal. She has no wheezes.  Neurological: She is alert and oriented to person, place, and time.  Psychiatric: She has a normal mood and affect. Her behavior is normal.          Assessment & Plan:

## 2012-10-22 LAB — HERPESVIRUS 6 HUMAN IGM: Herpesvirus-6, Human IgM Antibodies: <1:40 {titer}

## 2012-10-22 LAB — HERPES VIRUS 6 AB, IGG: Herpesvirus-6 IgG Ab: 1:1280 {titer} — AB

## 2012-11-19 ENCOUNTER — Ambulatory Visit: Payer: Managed Care, Other (non HMO) | Admitting: Internal Medicine

## 2012-12-21 ENCOUNTER — Ambulatory Visit (INDEPENDENT_AMBULATORY_CARE_PROVIDER_SITE_OTHER): Payer: Managed Care, Other (non HMO) | Admitting: Internal Medicine

## 2012-12-21 ENCOUNTER — Encounter: Payer: Self-pay | Admitting: Internal Medicine

## 2012-12-21 VITALS — BP 128/80 | Temp 98.0°F | Wt 187.0 lb

## 2012-12-21 DIAGNOSIS — IMO0001 Reserved for inherently not codable concepts without codable children: Secondary | ICD-10-CM

## 2012-12-21 DIAGNOSIS — R5382 Chronic fatigue, unspecified: Secondary | ICD-10-CM

## 2012-12-21 DIAGNOSIS — M797 Fibromyalgia: Secondary | ICD-10-CM

## 2012-12-21 DIAGNOSIS — R51 Headache: Secondary | ICD-10-CM

## 2012-12-21 DIAGNOSIS — S0300XA Dislocation of jaw, unspecified side, initial encounter: Secondary | ICD-10-CM

## 2012-12-21 LAB — C-REACTIVE PROTEIN: CRP: 3.2 mg/dL (ref 0.5–20.0)

## 2012-12-21 LAB — SEDIMENTATION RATE: Sed Rate: 50 mm/hr — ABNORMAL HIGH (ref 0–22)

## 2012-12-21 MED ORDER — TAPENTADOL HCL ER 50 MG PO TB12: 50.0000 mg | ORAL_TABLET | Freq: Two times a day (BID) | ORAL | Status: DC

## 2012-12-21 MED ORDER — OMEPRAZOLE 40 MG PO CPDR: 40.0000 mg | DELAYED_RELEASE_CAPSULE | Freq: Every day | ORAL | Status: DC

## 2012-12-21 MED ORDER — CLONAZEPAM 0.5 MG PO TABS: 0.5000 mg | ORAL_TABLET | Freq: Every evening | ORAL | Status: DC | PRN

## 2012-12-21 MED ORDER — AMOXICILLIN 875 MG PO TABS: 875.0000 mg | ORAL_TABLET | Freq: Two times a day (BID) | ORAL | Status: DC

## 2012-12-21 NOTE — Patient Instructions (Addendum)
Use ibuprofen 400-600 mg twice daily as needed Follow up with your dentist for mouth guard. Valganciclovir (Valcyte)

## 2012-12-21 NOTE — Progress Notes (Signed)
Subjective:    Patient ID: Katie [name], female    DOB: 1980-09-09, 32 y.o.   MRN: [id number]  HPI  32 year old white female with history of chronic fatigue and fibromyalgia for followup. Patient complains of upper dental pain for last 2-3 weeks. She also complains of intermittent facial pain. Facial pain is more prevalent on left side. She denies any sinus congestion or recent upper respiratory infection. She reports history of bruxism and thinks her symptoms may related to clenching her jaw.  Fibromyalgia-she gets little relief with using tramadol.  Chronic fatigue syndrome-we checked herpes virus 6 IgG. It was significantly elevated at 1:1280.  She working towards tapering off Lithium.  She would like to try cymbalta in the future.   Review of Systems Negative for fever or chills  Past Medical History  Diagnosis Date  . Asthma   . Bipolar affective disorder     onogoing Dr Maye Hides treatment for the past 10 years  . GERD (gastroesophageal reflux disease)   . Allergy   . Migraines     teenage onset with periods  . History of recurrent UTIs     cystoscopy with Dr Edwin Cap [facility]  . History of kidney stones     highschool    History   Social History  . Marital Status: Married    Spouse Name: N/A    Number of Children: N/A  . Years of Education: N/A   Occupational History  . preschool teacher and nanny    Social History Main Topics  . Smoking status: Never Smoker   . Smokeless tobacco: Never Used  . Alcohol Use: No  . Drug Use: No  . Sexually Active: Yes    Birth Control/ Protection: Pill   Other Topics Concern  . Not on file   Social History Narrative   Grew up in [place]   Married 5 yrs   Occupation - prev occupation.  Preschool teacher and nanny   Never smoked.  No etoh no drugs    Past Surgical History  Procedure Laterality Date  . Adenoidectomy      1990  . Knee surgery      left knee torn cartilage removed 1994  . Hemangioma excision       2009 removal from right ar    Family History  Problem Relation Age of Onset  . Alcohol abuse Paternal Grandmother   . Alcohol abuse Paternal Grandfather   . Lung cancer Paternal Grandfather   . Alcohol abuse Maternal Grandfather   . Prostate cancer Maternal Grandfather   . Osteoarthritis Mother   . Lymphoma Mother     Non-Hodgkin's lymphoma  . Hyperlipidemia Mother   . Hypertension Mother   . Diabetes Mother     type 2  . Osteoarthritis Father   . Other Father     Depression.anxiety  . Lung cancer Maternal Grandmother   . Diabetes Maternal Grandmother   . Diabetes Maternal Aunt     Allergies  Allergen Reactions  . Zonisamide Nausea And Vomiting    Current Outpatient Prescriptions on File Prior to Visit  Medication Sig Dispense Refill  . Cholecalciferol (VITAMIN D3) 2000 UNITS TABS Take 1 tablet by mouth daily.      . Fluvoxamine Maleate (LUVOX CR) 100 MG CP24 Take 150 mg by mouth 2 (two) times daily.       Marland Kitchen gabapentin (NEURONTIN) 300 MG capsule Take 1 capsule (300 mg total) by mouth 3 (three) times daily.  270 capsule  1  . lithium 300 MG tablet Take 300 mg by mouth at bedtime.       Lorita Officer Triphasic (TRI-SPRINTEC) 0.18/0.215/0.25 MG-35 MCG TABS Take by mouth daily.         No current facility-administered medications on file prior to visit.    BP 128/80  Temp(Src) 98 F (36.7 C) (Oral)  Wt 187 lb (84.823 kg)  BMI 32.08 kg/m2       Objective:   Physical Exam  Constitutional: She is oriented to person, place, and time. She appears well-developed and well-nourished.  HENT:  Head: Normocephalic and atraumatic.  Right Ear: External ear normal.  Left Ear: External ear normal.  Mouth/Throat: Oropharynx is clear and moist.  Bilateral TMJ tenderness  Neck: Neck supple.  Cardiovascular: Normal rate, regular rhythm and normal heart sounds.   No murmur heard. Pulmonary/Chest: Effort normal and breath sounds normal. She has no wheezes.   Lymphadenopathy:    She has no cervical adenopathy.  Neurological: She is alert and oriented to person, place, and time. No cranial nerve deficit.  Psychiatric: She has a normal mood and affect. Her behavior is normal.          Assessment & Plan:

## 2012-12-21 NOTE — Assessment & Plan Note (Addendum)
32 year old female experiencing upper dental pain and facial pain of unclear etiology. Her symptoms may be secondary to TMJ/bruxism. Patient to followup with her dentist. No history suggestive of infectious etiology. Check sedimentation rate and CRP. If elevated, consider empiric course of amoxicillin.  Addendum: Sedimentation rate slightly elevated at 50. CRP also slightly elevated. I would favor infectious process versus inflammatory. Empiric treatment with amoxicillin 875 mg twice daily for 7 days. Patient also advised to followup with her dentist.  Patient advised to call office if symptoms persist or worsen.

## 2012-12-21 NOTE — Assessment & Plan Note (Signed)
Patient has significantly elevated HSV 6 antibodies. Dr. Braulio Bosch at [facility] has had some success using Valcyte. Patient to inquire about insurance coverage. We will discuss further at next office visit.

## 2012-12-21 NOTE — Assessment & Plan Note (Signed)
Inadequate pain relief with tramadol. Switch to Nucynta ER 50 mg twice daily.

## 2012-12-30 ENCOUNTER — Encounter: Payer: Self-pay | Admitting: Internal Medicine

## 2013-01-06 ENCOUNTER — Telehealth: Payer: Self-pay | Admitting: Internal Medicine

## 2013-01-06 ENCOUNTER — Other Ambulatory Visit: Payer: Self-pay | Admitting: Internal Medicine

## 2013-01-06 ENCOUNTER — Encounter: Payer: Self-pay | Admitting: Internal Medicine

## 2013-01-06 DIAGNOSIS — M2550 Pain in unspecified joint: Secondary | ICD-10-CM

## 2013-01-06 MED ORDER — COLCHICINE 0.6 MG PO TABS: 0.6000 mg | ORAL_TABLET | Freq: Every day | ORAL | Status: DC

## 2013-01-06 MED ORDER — TRAMADOL HCL 50 MG PO TABS: 50.0000 mg | ORAL_TABLET | Freq: Three times a day (TID) | ORAL | Status: DC | PRN

## 2013-01-06 NOTE — Telephone Encounter (Signed)
Cindy - Please see below - UC Notification, but also looks as though pt is asking to go back to previous med. Please advise.       [facility] Triage Call Report Triage Record Num: [id number] Operator: Geanie Berlin Patient Name: Katie [name] Call Date & Time: 01/05/2013 5:10:17PM Patient Phone: [phone] PCP: Thomos Lemons Patient Gender: Female PCP Fax : [phone] Patient DOB: 07-15-1980 [facility] Name[facility] Reason for Call: Caller: Katie [name]/Patient; PCP: Thomos Lemons; CB#: [phone]; Call regarding chronic left Shoulder pain, bruising under shoulder blade, pain meds not working; Onset: 01/04/13. Afebrile. Started on Nucynta ER 50 mg 1 po qd at office visit 12/21/12; Pain relief only lasts 8 hours. Asking to return Tamadol. Shoulder pain currently 4/10; last night it was 8/10. History of fibromylagia. Was crying with shoulder and generalized pain last night. Advised to see [facility] within 4 hours for new tenderness in or around joint (and bruising) per Shoulder Non-Injury guideline. Protocol(s) Used: Shoulder Non-Injury Recommended Outcome per Protocol: See Provider within 4 hours Reason for Outcome: New swelling with redness and pain or tenderness in or around joint Care Advice: ~ Call provider if symptoms worsen or new symptoms develop. ~ SYMPTOM / CONDITION MANAGEMENT ~ List, or take, all current prescription(s), nonprescription or alternative medication(s) to provider for evaluation. Apply local moist heat (such as a warm, wet wash cloth covered with plastic wrap) to the area for 15-20 minutes every 2-3 hours while awake. ~ ~ Support part in position of comfort to reduce pain and swelling; avoid unnecessary movement. 06/

## 2013-01-07 ENCOUNTER — Ambulatory Visit (INDEPENDENT_AMBULATORY_CARE_PROVIDER_SITE_OTHER): Payer: Managed Care, Other (non HMO) | Admitting: Internal Medicine

## 2013-01-07 ENCOUNTER — Encounter: Payer: Self-pay | Admitting: Internal Medicine

## 2013-01-07 VITALS — BP 124/88 | HR 88 | Temp 98.6°F | Wt 181.0 lb

## 2013-01-07 DIAGNOSIS — R5382 Chronic fatigue, unspecified: Secondary | ICD-10-CM

## 2013-01-07 DIAGNOSIS — M2550 Pain in unspecified joint: Secondary | ICD-10-CM

## 2013-01-07 DIAGNOSIS — M797 Fibromyalgia: Secondary | ICD-10-CM

## 2013-01-07 MED ORDER — TRAZODONE HCL 50 MG PO TABS: 50.0000 mg | ORAL_TABLET | Freq: Every evening | ORAL | Status: DC | PRN

## 2013-01-07 MED ORDER — HYDROCODONE-ACETAMINOPHEN 5-325 MG PO TABS: 1.0000 | ORAL_TABLET | Freq: Three times a day (TID) | ORAL | Status: DC | PRN

## 2013-01-07 MED ORDER — CLONAZEPAM 1 MG PO TABS: 1.0000 mg | ORAL_TABLET | Freq: Every evening | ORAL | Status: DC | PRN

## 2013-01-07 MED ORDER — CLONAZEPAM 0.5 MG PO TABS: ORAL_TABLET | ORAL | Status: DC

## 2013-01-07 NOTE — Progress Notes (Signed)
Subjective:    Patient ID: Katie [name], female    DOB: 05-04-1981, 32 y.o.   MRN: [id number]  HPI  32 year old white female previously seen for chronic fatigue syndrome and fibromyalgia for followup. She recently returned from visiting family member in [place]. Her exertion made her musculoskeletal complaints which worse. We also discontinued her tramadol and she was tried on Nucynta ER 50 mg twice daily.  Patient unable to get adequate pain control. She's had very poor sleep due to significant pains at night.  Her psychiatrist start Cymbalta 30 mg. She has yet to start medication.  Patient experiencing significant disability from her chronic fatigue and fibromyalgia.  Review of Systems Negative for joint redness or swelling.  Mood is also worse Patient has seen neurologists in the past who performed spinal tap. It was negative for signs of MS. She also reports Western blot for Lyme disease was completed - reported negative.    Past Medical History  Diagnosis Date  . Asthma   . Bipolar affective disorder     onogoing Dr Maye Hides treatment for the past 10 years  . GERD (gastroesophageal reflux disease)   . Allergy   . Migraines     teenage onset with periods  . History of recurrent UTIs     cystoscopy with Dr Edwin Cap [facility]  . History of kidney stones     highschool    History   Social History  . Marital Status: Married    Spouse Name: N/A    Number of Children: N/A  . Years of Education: N/A   Occupational History  . preschool teacher and nanny    Social History Main Topics  . Smoking status: Never Smoker   . Smokeless tobacco: Never Used  . Alcohol Use: No  . Drug Use: No  . Sexually Active: Yes    Birth Control/ Protection: Pill   Other Topics Concern  . Not on file   Social History Narrative   Grew up in [place]   Married 5 yrs   Occupation - prev occupation.  Preschool teacher and nanny   Never smoked.  No etoh no drugs    Past Surgical  History  Procedure Laterality Date  . Adenoidectomy      1990  . Knee surgery      left knee torn cartilage removed 1994  . Hemangioma excision      2009 removal from right ar    Family History  Problem Relation Age of Onset  . Alcohol abuse Paternal Grandmother   . Alcohol abuse Paternal Grandfather   . Lung cancer Paternal Grandfather   . Alcohol abuse Maternal Grandfather   . Prostate cancer Maternal Grandfather   . Osteoarthritis Mother   . Lymphoma Mother     Non-Hodgkin's lymphoma  . Hyperlipidemia Mother   . Hypertension Mother   . Diabetes Mother     type 2  . Osteoarthritis Father   . Other Father     Depression.anxiety  . Lung cancer Maternal Grandmother   . Diabetes Maternal Grandmother   . Diabetes Maternal Aunt     Allergies  Allergen Reactions  . Zonisamide Nausea And Vomiting    Current Outpatient Prescriptions on File Prior to Visit  Medication Sig Dispense Refill  . Cholecalciferol (VITAMIN D3) 2000 UNITS TABS Take 1 tablet by mouth daily.      . colchicine 0.6 MG tablet Take 1 tablet (0.6 mg total) by mouth daily.  30 tablet  1  .  gabapentin (NEURONTIN) 300 MG capsule Take 1 capsule (300 mg total) by mouth 3 (three) times daily.  270 capsule  1  . Norgestim-Eth Estrad Triphasic (TRI-SPRINTEC) 0.18/0.215/0.25 MG-35 MCG TABS Take by mouth daily.        Marland Kitchen omeprazole (PRILOSEC) 40 MG capsule Take 1 capsule (40 mg total) by mouth daily.  90 capsule  1   No current facility-administered medications on file prior to visit.    BP 124/88  Pulse 88  Temp(Src) 98.6 F (37 C) (Oral)  Wt 181 lb (82.101 kg)  BMI 31.05 kg/m2    Objective:   Physical Exam  Constitutional: She is oriented to person, place, and time. She appears well-developed and well-nourished.  HENT:  Head: Normocephalic and atraumatic.  Cardiovascular: Normal rate, regular rhythm and normal heart sounds.   Pulmonary/Chest: Effort normal and breath sounds normal. She has no wheezes.   Abdominal: Soft. Bowel sounds are normal. There is no tenderness.  Neurological: She is alert and oriented to person, place, and time. No cranial nerve deficit.  Skin: Skin is warm and dry.  Psychiatric:  Slightly agitated, nervous hand movements          Assessment & Plan:

## 2013-01-07 NOTE — Assessment & Plan Note (Addendum)
Patient experiencing significant exacerbation of musculoskeletal pain since discontinuing tramadol and trial of Nucynta.  Discontinue Nucynta.  Use vicodin 5/325 tid prn.  Unable to restart tramadol due to interaction with Cymbalta.  Her sleep quality has also suffered. Continue clonazepam 0.5 mg. Restart trazodone 50 mg at bedtime.

## 2013-01-07 NOTE — Telephone Encounter (Signed)
i sent rx for tramadol and colchicine yesterday.  See mychart note to pt

## 2013-01-18 ENCOUNTER — Ambulatory Visit: Payer: Managed Care, Other (non HMO) | Admitting: Internal Medicine

## 2013-01-19 ENCOUNTER — Ambulatory Visit (INDEPENDENT_AMBULATORY_CARE_PROVIDER_SITE_OTHER): Payer: Managed Care, Other (non HMO) | Admitting: Internal Medicine

## 2013-01-19 ENCOUNTER — Encounter: Payer: Self-pay | Admitting: Internal Medicine

## 2013-01-19 VITALS — BP 136/90 | HR 100 | Temp 98.8°F | Resp 20 | Wt 176.0 lb

## 2013-01-19 DIAGNOSIS — M797 Fibromyalgia: Secondary | ICD-10-CM

## 2013-01-19 DIAGNOSIS — R5382 Chronic fatigue, unspecified: Secondary | ICD-10-CM

## 2013-01-19 DIAGNOSIS — M542 Cervicalgia: Secondary | ICD-10-CM

## 2013-01-19 DIAGNOSIS — F319 Bipolar disorder, unspecified: Secondary | ICD-10-CM

## 2013-01-19 DIAGNOSIS — IMO0001 Reserved for inherently not codable concepts without codable children: Secondary | ICD-10-CM

## 2013-01-19 DIAGNOSIS — G9332 Myalgic encephalomyelitis/chronic fatigue syndrome: Secondary | ICD-10-CM

## 2013-01-19 NOTE — Patient Instructions (Signed)
Call or return to clinic prn if these symptoms worsen or fail to improve as anticipated.  Follow up in one month with Dr. Artist Pais

## 2013-01-19 NOTE — Progress Notes (Signed)
Subjective:    Patient ID: Katie [name], female    DOB: April 21, 1981, 32 y.o.   MRN: [id number]  HPI  32 year old patient who has a history of fibromyalgia/CFS who presents today for followup. Her chief complaint of significant pain. She presently is using hydrocodone intermittently. She has been on tramadol in the past. She has poor sleep but she feels this is related to pain. She is on a dose of Klonopin and trazodone at bedtime. She is followed by psychiatry and is on Cymbalta 30 mg . Pain  is fairly generalized but presently most marked in the left shoulder and posterior neck region  Past Medical History  Diagnosis Date  . Asthma   . Bipolar affective disorder     onogoing Dr Maye Hides treatment for the past 10 years  . GERD (gastroesophageal reflux disease)   . Allergy   . Migraines     teenage onset with periods  . History of recurrent UTIs     cystoscopy with Dr Edwin Cap [facility]  . History of kidney stones     highschool    History   Social History  . Marital Status: Married    Spouse Name: N/A    Number of Children: N/A  . Years of Education: N/A   Occupational History  . preschool teacher and nanny    Social History Main Topics  . Smoking status: Never Smoker   . Smokeless tobacco: Never Used  . Alcohol Use: No  . Drug Use: No  . Sexually Active: Yes    Birth Control/ Protection: Pill   Other Topics Concern  . Not on file   Social History Narrative   Grew up in [place]   Married 5 yrs   Occupation - prev occupation.  Preschool teacher and nanny   Never smoked.  No etoh no drugs    Past Surgical History  Procedure Laterality Date  . Adenoidectomy      1990  . Knee surgery      left knee torn cartilage removed 1994  . Hemangioma excision      2009 removal from right ar    Family History  Problem Relation Age of Onset  . Alcohol abuse Paternal Grandmother   . Alcohol abuse Paternal Grandfather   . Lung cancer Paternal Grandfather   .  Alcohol abuse Maternal Grandfather   . Prostate cancer Maternal Grandfather   . Osteoarthritis Mother   . Lymphoma Mother     Non-Hodgkin's lymphoma  . Hyperlipidemia Mother   . Hypertension Mother   . Diabetes Mother     type 2  . Osteoarthritis Father   . Other Father     Depression.anxiety  . Lung cancer Maternal Grandmother   . Diabetes Maternal Grandmother   . Diabetes Maternal Aunt     Allergies  Allergen Reactions  . Zonisamide Nausea And Vomiting    Current Outpatient Prescriptions on File Prior to Visit  Medication Sig Dispense Refill  . Cholecalciferol (VITAMIN D3) 2000 UNITS TABS Take 1 tablet by mouth daily.      . clonazePAM (KLONOPIN) 0.5 MG tablet .5 to 1 mg at bedtime as needed for sleep  60 tablet  3  . colchicine 0.6 MG tablet Take 1 tablet (0.6 mg total) by mouth daily.  30 tablet  1  . DULoxetine (CYMBALTA) 30 MG capsule Take 1 capsule (30 mg total) by mouth daily.    3  . Fluvoxamine Maleate (LUVOX CR) 100 MG CP24 Take  1 capsule (100 mg total) by mouth 2 (two) times daily.  30 each    . gabapentin (NEURONTIN) 300 MG capsule Take 1 capsule (300 mg total) by mouth 3 (three) times daily.  270 capsule  1  . HYDROcodone-acetaminophen (NORCO/VICODIN) 5-325 MG per tablet Take 1 tablet by mouth every 8 (eight) hours as needed for pain.  90 tablet  0  . Norgestim-Eth Estrad Triphasic (TRI-SPRINTEC) 0.18/0.215/0.25 MG-35 MCG TABS Take by mouth daily.        Marland Kitchen omeprazole (PRILOSEC) 40 MG capsule Take 1 capsule (40 mg total) by mouth daily.  90 capsule  1  . traZODone (DESYREL) 50 MG tablet Take 1 tablet (50 mg total) by mouth at bedtime as needed for sleep.  30 tablet  3   No current facility-administered medications on file prior to visit.    BP 136/90  Pulse 100  Temp(Src) 98.8 F (37.1 C) (Oral)  Resp 20  Wt 176 lb (79.833 kg)  BMI 30.2 kg/m2  SpO2 97%  LMP 12/24/2012     Review of Systems  Musculoskeletal: Positive for myalgias, back pain and  arthralgias.       Objective:   Physical Exam  Constitutional: She is oriented to person, place, and time. She appears well-developed and well-nourished. No distress.  HENT:  Head: Normocephalic.  Right Ear: External ear normal.  Left Ear: External ear normal.  Mouth/Throat: Oropharynx is clear and moist.  Eyes: Conjunctivae and EOM are normal. Pupils are equal, round, and reactive to light.  Neck: Normal range of motion. Neck supple. No thyromegaly present.  Cardiovascular: Normal rate, regular rhythm, normal heart sounds and intact distal pulses.   Pulmonary/Chest: Effort normal and breath sounds normal.  Abdominal: Soft. Bowel sounds are normal. She exhibits no mass. There is no tenderness.  Musculoskeletal: Normal range of motion.  No joint tenderness or active synovitis  Lymphadenopathy:    She has no cervical adenopathy.  Neurological: She is alert and oriented to person, place, and time.  Skin: Skin is warm and dry. No rash noted.  Psychiatric: She has a normal mood and affect. Her behavior is normal.  Slightly depressed affect          Assessment & Plan:  Fibromyalgia/chronic fatigue syndrome  Will increase Cymbalta since 60 mg daily.  Patient had a partial response to tramadol we'll resume this every 6 hours along with hydrocodone. Followup psychiatry  Return here in one month for followup

## 2013-01-28 ENCOUNTER — Telehealth: Payer: Self-pay | Admitting: Internal Medicine

## 2013-01-28 MED ORDER — DULOXETINE HCL 60 MG PO CPEP: 60.0000 mg | ORAL_CAPSULE | Freq: Every day | ORAL | Status: DC

## 2013-01-28 NOTE — Telephone Encounter (Signed)
Pt  States Dr Amador Cunas was to RX her a new script for DULoxetine (CYMBALTA)and increase to a 60 mg, but it has not been called in. Pls advise.  [facility]

## 2013-01-28 NOTE — Telephone Encounter (Signed)
New rx sent in per office note. Patient to have a follow up visit in 1 month.

## 2013-02-10 ENCOUNTER — Telehealth: Payer: Self-pay | Admitting: Internal Medicine

## 2013-02-10 MED ORDER — HYDROCODONE-ACETAMINOPHEN 5-325 MG PO TABS: 1.0000 | ORAL_TABLET | Freq: Three times a day (TID) | ORAL | Status: DC | PRN

## 2013-02-10 NOTE — Telephone Encounter (Signed)
Patient Information:  Caller Name: Tasnim  Phone: [phone]  Patient: Katie [name], Katie [name]  Gender: Female  DOB: 01-11-1981  Age: 32 Years  PCP: Artist Pais Doe-Hyun Molly Maduro) (Adults only)  Pregnant: No  Office Follow Up:  Does the office need to follow up with this patient?: Yes  Instructions For The Office: PLEASE CHECK ON RX REFILL REQUEST SUBMITTED ON 02/10/13; NO ANSWER PER EPIC AT THIS TIME. CARE ADVICE GIVEN AND PT TO CALL BACK FOR APPT IF SX CHX OR WORSEN.  RN Note:  PLEASE CHECK ON RX REFILL REQUEST SUBMITTED ON 02/10/13; NO ANSWER PER EPIC AT THIS TIME. CARE ADVICE GIVEN AND PT TO CALL BACK FOR APPT IF SX CHX OR WORSEN.  Symptoms  Reason For Call & Symptoms: Pt calling regarding chronic fatigue syndrome. Pt states she has been treated for "a while now". Pt has an appt on 02/18/13 but states she is in a lot of pain an asking if she can take 1 1/2 Norco instead of just one. Also asking if it has been called in yet from 02/10/13 request. Adivsed to take med as prescribed, no differently. Triage offered and declined. Pt states sx are no different as they have been in the last 4 years. Offered to make a sooner appt than 02/18/13. Pt declined. Only wants to see Dr. Artist Pais and also states it is for financial reasons.  Reviewed Health History In EMR: Yes  Reviewed Medications In EMR: Yes  Reviewed Allergies In EMR: Yes  Reviewed Surgeries / Procedures: Yes  Date of Onset of Symptoms: 02/08/2013  Treatments Tried: Norco  Treatments Tried Worked: No OB / GYN:  LMP: Unknown  Guideline(s) Used:  No Protocol Available - Sick Adult  Disposition Per Guideline:   Discuss with PCP and Callback by Nurse Today  Reason For Disposition Reached:   Nursing judgment  Advice Given:  N/A  Patient Will Follow Care Advice:  YES

## 2013-02-10 NOTE — Telephone Encounter (Signed)
Ok per Dr Artist Pais for a 2 wk supply, rx called in to pharmacy, pt needs OV in 2 wks.  Could you call pt to schedule OV?

## 2013-02-10 NOTE — Telephone Encounter (Signed)
Duplicate note

## 2013-02-10 NOTE — Telephone Encounter (Signed)
Ok for refill of hydrocodone x 1.  Needs OV to discuss chronic pain issues within 2 weeks

## 2013-02-10 NOTE — Telephone Encounter (Signed)
PT is calling to request a refill of her HYDROcodone-acetaminophen (NORCO/VICODIN) 5-325 MG per tablet be sent to [facility] on [address]. Please assist.

## 2013-02-18 ENCOUNTER — Ambulatory Visit (INDEPENDENT_AMBULATORY_CARE_PROVIDER_SITE_OTHER): Payer: Managed Care, Other (non HMO) | Admitting: Internal Medicine

## 2013-02-18 ENCOUNTER — Encounter: Payer: Self-pay | Admitting: Internal Medicine

## 2013-02-18 VITALS — BP 132/90 | Temp 98.5°F | Wt 179.0 lb

## 2013-02-18 DIAGNOSIS — M797 Fibromyalgia: Secondary | ICD-10-CM

## 2013-02-18 DIAGNOSIS — R5382 Chronic fatigue, unspecified: Secondary | ICD-10-CM

## 2013-02-18 MED ORDER — GABAPENTIN 400 MG PO CAPS: 400.0000 mg | ORAL_CAPSULE | Freq: Three times a day (TID) | ORAL | Status: DC

## 2013-02-18 MED ORDER — DULOXETINE HCL 30 MG PO CPEP: 90.0000 mg | ORAL_CAPSULE | Freq: Every day | ORAL | Status: DC

## 2013-02-18 NOTE — Patient Instructions (Addendum)
Use NAC ([place]) - 600 mg twice daily

## 2013-02-18 NOTE — Assessment & Plan Note (Signed)
Patient's fibromyalgia pain refractory to hydrocodone and current dose of Cymbalta. Increase Cymbalta to 90 mg. Increase gabapentin to 400 mg 3 times daily. Utilizied myofascial release techniques and HVLA to lumbar, thoracic and cervical spine. Patient tolerated well with immediate relief. If she responds to OMT, she can return in 1 to 2 weeks for additional treatments. I would like to avoid using stronger narcotics for fibromyalgia pain. Patient encouraged to start hydrotherapy.

## 2013-02-18 NOTE — Progress Notes (Signed)
Subjective:    Patient ID: Katie [name], female    DOB: May 17, 1981, 32 y.o.   MRN: [id number]  HPI  32 year old white female with history of chronic fatigue syndrome and severe fibromyalgia for followup. Since previous visit Cymbalta was increased to 60 mg. Unfortunately it has not improved her musculoskeletal complaints. She is using vicodin every 8 hours as needed. Her pain is localized to her upper, middle and lower back. She also has chronic wrist forearm and bilateral knee pain. She describes constant aching sensation that's 7/10 but can be as high as 9/10. She uses heating pads. She also takes warm Epsom salt baths as needed.  She has been more tearful. Her mother has noticed more uncontrolled hand movements. Her psychiatrist has been trying Luvox to help control involuntary movements without significant improvement.  Review of Systems Negative for fever, negative joint swelling  Past Medical History  Diagnosis Date  . Asthma   . Bipolar affective disorder     onogoing Dr Maye Hides treatment for the past 10 years  . GERD (gastroesophageal reflux disease)   . Allergy   . Migraines     teenage onset with periods  . History of recurrent UTIs     cystoscopy with Dr Edwin Cap [facility]  . History of kidney stones     highschool    History   Social History  . Marital Status: Married    Spouse Name: N/A    Number of Children: N/A  . Years of Education: N/A   Occupational History  . preschool teacher and nanny    Social History Main Topics  . Smoking status: Never Smoker   . Smokeless tobacco: Never Used  . Alcohol Use: No  . Drug Use: No  . Sexually Active: Yes    Birth Control/ Protection: Pill   Other Topics Concern  . Not on file   Social History Narrative   Grew up in [place]   Married 5 yrs   Occupation - prev occupation.  Preschool teacher and nanny   Never smoked.  No etoh no drugs    Past Surgical History  Procedure Laterality Date  .  Adenoidectomy      1990  . Knee surgery      left knee torn cartilage removed 1994  . Hemangioma excision      2009 removal from right ar    Family History  Problem Relation Age of Onset  . Alcohol abuse Paternal Grandmother   . Alcohol abuse Paternal Grandfather   . Lung cancer Paternal Grandfather   . Alcohol abuse Maternal Grandfather   . Prostate cancer Maternal Grandfather   . Osteoarthritis Mother   . Lymphoma Mother     Non-Hodgkin's lymphoma  . Hyperlipidemia Mother   . Hypertension Mother   . Diabetes Mother     type 2  . Osteoarthritis Father   . Other Father     Depression.anxiety  . Lung cancer Maternal Grandmother   . Diabetes Maternal Grandmother   . Diabetes Maternal Aunt     Allergies  Allergen Reactions  . Zonisamide Nausea And Vomiting    Current Outpatient Prescriptions on File Prior to Visit  Medication Sig Dispense Refill  . Cholecalciferol (VITAMIN D3) 2000 UNITS TABS Take 1 tablet by mouth daily.      . clonazePAM (KLONOPIN) 0.5 MG tablet .5 to 1 mg at bedtime as needed for sleep  60 tablet  3  . colchicine 0.6 MG tablet Take 1 tablet (  0.6 mg total) by mouth daily.  30 tablet  1  . Fluvoxamine Maleate (LUVOX CR) 100 MG CP24 Take 1 capsule (100 mg total) by mouth 2 (two) times daily.  30 each    . HYDROcodone-acetaminophen (NORCO/VICODIN) 5-325 MG per tablet Take 1 tablet by mouth every 8 (eight) hours as needed for pain.  45 tablet  0  . Norgestim-Eth Estrad Triphasic (TRI-SPRINTEC) 0.18/0.215/0.25 MG-35 MCG TABS Take by mouth daily.        Marland Kitchen omeprazole (PRILOSEC) 40 MG capsule Take 1 capsule (40 mg total) by mouth daily.  90 capsule  1  . traZODone (DESYREL) 50 MG tablet Take 1 tablet (50 mg total) by mouth at bedtime as needed for sleep.  30 tablet  3   No current facility-administered medications on file prior to visit.    BP 132/90  Temp(Src) 98.5 F (36.9 C) (Oral)  Wt 179 lb (81.194 kg)  BMI 30.71 kg/m2  LMP 12/24/2012        Objective:   Physical Exam  Constitutional: She is oriented to person, place, and time. She appears well-developed and well-nourished.  HENT:  Head: Normocephalic and atraumatic.  Neck:  Chronic ropy sensation to her spinal muscles of cervical spine, thoracic spine and lumbar spine  Cardiovascular: Normal rate, regular rhythm and normal heart sounds.   Pulmonary/Chest: Effort normal and breath sounds normal. She has no wheezes.  Musculoskeletal: Normal range of motion. She exhibits no edema.  Neurological: She is alert and oriented to person, place, and time. No cranial nerve deficit.  Psychiatric: She has a normal mood and affect. Her behavior is normal.          Assessment & Plan:

## 2013-03-01 ENCOUNTER — Other Ambulatory Visit: Payer: Self-pay | Admitting: *Deleted

## 2013-03-01 MED ORDER — HYDROCODONE-ACETAMINOPHEN 5-325 MG PO TABS: 1.0000 | ORAL_TABLET | Freq: Three times a day (TID) | ORAL | Status: DC | PRN

## 2013-03-10 ENCOUNTER — Telehealth: Payer: Self-pay | Admitting: Internal Medicine

## 2013-03-10 NOTE — Telephone Encounter (Signed)
PT is calling to request a 3 months supply of her traMADol (ULTRAM) 50 MG tablet called into [facility] on [address]. Please assist.

## 2013-03-11 MED ORDER — TRAMADOL HCL 50 MG PO TABS: 50.0000 mg | ORAL_TABLET | Freq: Three times a day (TID) | ORAL | Status: DC | PRN

## 2013-03-11 NOTE — Telephone Encounter (Signed)
rx called in

## 2013-03-11 NOTE — Telephone Encounter (Signed)
Ok for 1 month supply.  We can further discuss pain mgt issues at her next OV on 9/12

## 2013-03-21 ENCOUNTER — Ambulatory Visit (INDEPENDENT_AMBULATORY_CARE_PROVIDER_SITE_OTHER): Payer: Managed Care, Other (non HMO) | Admitting: Internal Medicine

## 2013-03-21 ENCOUNTER — Encounter: Payer: Self-pay | Admitting: Internal Medicine

## 2013-03-21 VITALS — BP 122/94 | Temp 98.8°F | Wt 174.0 lb

## 2013-03-21 DIAGNOSIS — Z23 Encounter for immunization: Secondary | ICD-10-CM

## 2013-03-21 DIAGNOSIS — F329 Major depressive disorder, single episode, unspecified: Secondary | ICD-10-CM

## 2013-03-21 DIAGNOSIS — M797 Fibromyalgia: Secondary | ICD-10-CM

## 2013-03-21 DIAGNOSIS — R5382 Chronic fatigue, unspecified: Secondary | ICD-10-CM

## 2013-03-21 DIAGNOSIS — F322 Major depressive disorder, single episode, severe without psychotic features: Secondary | ICD-10-CM

## 2013-03-21 MED ORDER — DULOXETINE HCL 60 MG PO CPEP: 120.0000 mg | ORAL_CAPSULE | Freq: Every day | ORAL | Status: DC

## 2013-03-21 MED ORDER — GABAPENTIN 600 MG PO TABS: 600.0000 mg | ORAL_TABLET | Freq: Three times a day (TID) | ORAL | Status: DC

## 2013-03-21 NOTE — Progress Notes (Signed)
Subjective:    Patient ID: Katie [name], female    DOB: [birth year], 32 y.o.   MRN: [id number]  HPI  32 year old white female with history of chronic fatigue syndrome and severe for myalgias followup. Despite using 90 mg of Cymbalta and higher dose of Luvox patient's symptoms have not improved. Patient has multiple tender areas of her upper back/cervical spine and low back. Severe pain exacerbating depression.  Patient's aunt also has severe fibromyalgia.  Patient reports fibromyalgia pain has gradually worsened over last 4 years. Her symptoms significantly worse after physical exertion when she traveled to [place] for family reunion. Her symptoms worse/triggered with physical exertion.  Review of Systems No change in sleep quality  Past Medical History  Diagnosis Date  . Asthma   . Bipolar affective disorder     onogoing Dr Maye Hides treatment for the past 10 years  . GERD (gastroesophageal reflux disease)   . Allergy   . Migraines     teenage onset with periods  . History of recurrent UTIs     cystoscopy with Dr Edwin Cap [facility]  . History of kidney stones     highschool    History   Social History  . Marital Status: Married    Spouse Name: N/A    Number of Children: N/A  . Years of Education: N/A   Occupational History  . preschool teacher and nanny    Social History Main Topics  . Smoking status: Never Smoker   . Smokeless tobacco: Never Used  . Alcohol Use: No  . Drug Use: No  . Sexual Activity: Yes    Birth Control/ Protection: Pill   Other Topics Concern  . Not on file   Social History Narrative   Grew up in [place]   Married 5 yrs   Occupation - prev occupation.  Preschool teacher and nanny   Never smoked.  No etoh no drugs    Past Surgical History  Procedure Laterality Date  . Adenoidectomy      1990  . Knee surgery      left knee torn cartilage removed 1994  . Hemangioma excision      2009 removal from right ar    Family  History  Problem Relation Age of Onset  . Alcohol abuse Paternal Grandmother   . Alcohol abuse Paternal Grandfather   . Lung cancer Paternal Grandfather   . Alcohol abuse Maternal Grandfather   . Prostate cancer Maternal Grandfather   . Osteoarthritis Mother   . Lymphoma Mother     Non-Hodgkin's lymphoma  . Hyperlipidemia Mother   . Hypertension Mother   . Diabetes Mother     type 2  . Osteoarthritis Father   . Other Father     Depression.anxiety  . Lung cancer Maternal Grandmother   . Diabetes Maternal Grandmother   . Diabetes Maternal Aunt     Allergies  Allergen Reactions  . Zonisamide Nausea And Vomiting    Current Outpatient Prescriptions on File Prior to Visit  Medication Sig Dispense Refill  . Cholecalciferol (VITAMIN D3) 2000 UNITS TABS Take 1 tablet by mouth daily.      . clonazePAM (KLONOPIN) 0.5 MG tablet .5 to 1 mg at bedtime as needed for sleep  60 tablet  3  . colchicine 0.6 MG tablet Take 1 tablet (0.6 mg total) by mouth daily.  30 tablet  1  . Fluvoxamine Maleate (LUVOX CR) 100 MG CP24 Take 250 mg by mouth at bedtime.  30 each    . HYDROcodone-acetaminophen (NORCO/VICODIN) 5-325 MG per tablet Take 1 tablet by mouth every 8 (eight) hours as needed for pain.  30 tablet  1  . Norgestim-Eth Estrad Triphasic (TRI-SPRINTEC) 0.18/0.215/0.25 MG-35 MCG TABS Take by mouth daily.        Marland Kitchen omeprazole (PRILOSEC) 40 MG capsule Take 1 capsule (40 mg total) by mouth daily.  90 capsule  1  . traZODone (DESYREL) 50 MG tablet Take 1 tablet (50 mg total) by mouth at bedtime as needed for sleep.  30 tablet  3   No current facility-administered medications on file prior to visit.    BP 122/94  Temp(Src) 98.8 F (37.1 C) (Oral)  Wt 174 lb (78.926 kg)  BMI 29.85 kg/m2       Objective:   Physical Exam  Constitutional: She is oriented to person, place, and time. She appears well-developed and well-nourished.  Cardiovascular: Normal rate, regular rhythm and normal heart  sounds.   No murmur heard. Pulmonary/Chest: Effort normal and breath sounds normal. She has no wheezes.  Musculoskeletal:  Multiple true points located cervical spine, thoracic spine and lumbar spine  Neurological: She is alert and oriented to person, place, and time. No cranial nerve deficit.  Skin: Skin is warm and dry.  Psychiatric:  tearful          Assessment & Plan:

## 2013-03-21 NOTE — Assessment & Plan Note (Signed)
Increase Cymbalta to 120 mg and gabapentin to 600 mg 3 times a day. We discussed in detail preference to avoid using narcotics long-term. Patient reports some relief although temporary from myofascial/manual medicine performed at last office visit. Utilized myofascial release techniques to lumbar and thoracic spine. Performed HVLA to cervical spine. Performed strain counterstrain to tender points in the lumbar spine. We discussed trying trigger point injections. Reassess in one week.

## 2013-03-21 NOTE — Assessment & Plan Note (Signed)
Follow up with psychiatrist as directed.  Consider adding Abilify in depression worsens.

## 2013-03-22 ENCOUNTER — Other Ambulatory Visit: Payer: Self-pay | Admitting: *Deleted

## 2013-03-22 MED ORDER — TRAZODONE HCL 100 MG PO TABS: 100.0000 mg | ORAL_TABLET | Freq: Every day | ORAL | Status: DC

## 2013-03-23 ENCOUNTER — Encounter: Payer: Self-pay | Admitting: Internal Medicine

## 2013-03-24 ENCOUNTER — Other Ambulatory Visit: Payer: Self-pay | Admitting: Internal Medicine

## 2013-03-25 ENCOUNTER — Ambulatory Visit: Payer: Managed Care, Other (non HMO) | Admitting: Internal Medicine

## 2013-03-31 ENCOUNTER — Encounter: Payer: Self-pay | Admitting: Internal Medicine

## 2013-03-31 ENCOUNTER — Ambulatory Visit (INDEPENDENT_AMBULATORY_CARE_PROVIDER_SITE_OTHER): Payer: Managed Care, Other (non HMO) | Admitting: Internal Medicine

## 2013-03-31 VITALS — BP 110/80 | Temp 98.3°F | Wt 174.0 lb

## 2013-03-31 DIAGNOSIS — IMO0001 Reserved for inherently not codable concepts without codable children: Secondary | ICD-10-CM

## 2013-03-31 DIAGNOSIS — F329 Major depressive disorder, single episode, unspecified: Secondary | ICD-10-CM

## 2013-03-31 DIAGNOSIS — M797 Fibromyalgia: Secondary | ICD-10-CM

## 2013-03-31 DIAGNOSIS — F322 Major depressive disorder, single episode, severe without psychotic features: Secondary | ICD-10-CM

## 2013-03-31 NOTE — Assessment & Plan Note (Signed)
Continue higher dose of Cymbalta 120 mg once daily. She has multiple tender points along her upper back, thoracic area and thoracic lumbar junction. We discussed risk and benefits of trigger point injection. Injected 6 trigger points with mixture of 1% lidocaine and 0.25% Marcaine (1-1). Total of 6 mL is injected. Aseptic technique maintained.  Injection areas prepped with Betadine and alcohol wipe.  Patient tolerated well and experienced immediate improvement. Patient encouraged to start water aerobics therapy/regular exercise. Reassess in one month.

## 2013-03-31 NOTE — Assessment & Plan Note (Signed)
Patient has poor relationship with her current psychiatrist. Refer to Dr. Orlinda Blalock.

## 2013-03-31 NOTE — Progress Notes (Signed)
Subjective:    Patient ID: Katie [name], female    DOB: [birth year], 32 y.o.   MRN: [id number]  HPI  32 year old white female with history of chronic fatigue syndrome and severe her myalgia for followup. Patient reports her overall pain only minimally improved since taking higher dose of Cymbalta (120 mg). Patient is experiencing shakiness in her hands. This was thought to be possibly secondary to serotonin syndrome and her Luvox dose was decreased to 100 mg. She was seen by her psychiatrist who started Wellbutrin 100 mg. Patient reports this medication causing nausea.  Her fibromyalgia pain is especially worse along her upper back, thoracic area and thoracolumbar junction. She has multiple tender points.  Review of Systems She has several cats at home.  She gets scratched on regular basis.  No adenopathy  Past Medical History  Diagnosis Date  . Asthma   . Bipolar affective disorder     onogoing Dr Maye Hides treatment for the past 10 years  . GERD (gastroesophageal reflux disease)   . Allergy   . Migraines     teenage onset with periods  . History of recurrent UTIs     cystoscopy with Dr Edwin Cap [facility]  . History of kidney stones     highschool    History   Social History  . Marital Status: Married    Spouse Name: N/A    Number of Children: N/A  . Years of Education: N/A   Occupational History  . preschool teacher and nanny    Social History Main Topics  . Smoking status: Never Smoker   . Smokeless tobacco: Never Used  . Alcohol Use: No  . Drug Use: No  . Sexual Activity: Yes    Birth Control/ Protection: Pill   Other Topics Concern  . Not on file   Social History Narrative   Grew up in [place]   Married 5 yrs   Occupation - prev occupation.  Preschool teacher and nanny   Never smoked.  No etoh no drugs    Past Surgical History  Procedure Laterality Date  . Adenoidectomy      1990  . Knee surgery      left knee torn cartilage removed 1994  .  Hemangioma excision      2009 removal from right ar    Family History  Problem Relation Age of Onset  . Alcohol abuse Paternal Grandmother   . Alcohol abuse Paternal Grandfather   . Lung cancer Paternal Grandfather   . Alcohol abuse Maternal Grandfather   . Prostate cancer Maternal Grandfather   . Osteoarthritis Mother   . Lymphoma Mother     Non-Hodgkin's lymphoma  . Hyperlipidemia Mother   . Hypertension Mother   . Diabetes Mother     type 2  . Osteoarthritis Father   . Other Father     Depression.anxiety  . Lung cancer Maternal Grandmother   . Diabetes Maternal Grandmother   . Diabetes Maternal Aunt     Allergies  Allergen Reactions  . Zonisamide Nausea And Vomiting    Current Outpatient Prescriptions on File Prior to Visit  Medication Sig Dispense Refill  . Cholecalciferol (VITAMIN D3) 2000 UNITS TABS Take 1 tablet by mouth daily.      . colchicine 0.6 MG tablet Take 1 tablet (0.6 mg total) by mouth daily.  30 tablet  1  . DULoxetine (CYMBALTA) 60 MG capsule Take 2 capsules (120 mg total) by mouth daily.  180 capsule  1  .  Fluvoxamine Maleate (LUVOX CR) 100 MG CP24 Take 1 capsule (100 mg total) by mouth at bedtime.  30 each    . gabapentin (NEURONTIN) 600 MG tablet Take 1 tablet (600 mg total) by mouth 3 (three) times daily.  90 tablet  3  . HYDROcodone-acetaminophen (NORCO/VICODIN) 5-325 MG per tablet Take 1 tablet by mouth every 8 (eight) hours as needed for pain.  30 tablet  1  . Norgestim-Eth Estrad Triphasic (TRI-SPRINTEC) 0.18/0.215/0.25 MG-35 MCG TABS Take by mouth daily.        Marland Kitchen omeprazole (PRILOSEC) 40 MG capsule Take 1 capsule (40 mg total) by mouth daily.  90 capsule  1  . traZODone (DESYREL) 100 MG tablet Take 1 tablet (100 mg total) by mouth at bedtime.  30 tablet  2   No current facility-administered medications on file prior to visit.    BP 110/80  Temp(Src) 98.3 F (36.8 C) (Oral)  Wt 174 lb (78.926 kg)  BMI 29.85 kg/m2       Objective:    Physical Exam  Constitutional: She appears well-developed and well-nourished.  Cardiovascular: Normal rate, regular rhythm and normal heart sounds.   No murmur heard. Pulmonary/Chest: Effort normal and breath sounds normal. She has no wheezes.  Musculoskeletal:  Multiple tender points bilateral upper trapezius, left rhomboid area and left thoracolumbar junction  Psychiatric: She has a normal mood and affect. Her behavior is normal.          Assessment & Plan:

## 2013-04-04 ENCOUNTER — Encounter: Payer: Self-pay | Admitting: Internal Medicine

## 2013-04-07 ENCOUNTER — Other Ambulatory Visit: Payer: Self-pay | Admitting: Internal Medicine

## 2013-04-08 ENCOUNTER — Other Ambulatory Visit: Payer: Self-pay | Admitting: Internal Medicine

## 2013-04-08 NOTE — Telephone Encounter (Signed)
Pt has 2 pills left.

## 2013-04-11 ENCOUNTER — Telehealth: Payer: Self-pay | Admitting: *Deleted

## 2013-04-11 MED ORDER — HYDROCODONE-ACETAMINOPHEN 5-325 MG PO TABS: 1.0000 | ORAL_TABLET | Freq: Two times a day (BID) | ORAL | Status: DC | PRN

## 2013-04-11 NOTE — Telephone Encounter (Signed)
rx called in, pt aware to d/c the tramadol

## 2013-04-11 NOTE — Telephone Encounter (Signed)
Refill tramadol 50 mg every 8 hours prn

## 2013-04-11 NOTE — Telephone Encounter (Signed)
Change to hydrocodone/apap 5/325 - # 60 one po qd to bid prn  No RF DC tramadol.

## 2013-04-12 ENCOUNTER — Encounter: Payer: Self-pay | Admitting: Internal Medicine

## 2013-04-14 MED ORDER — TIZANIDINE HCL 2 MG PO TABS: 1.0000 mg | ORAL_TABLET | Freq: Three times a day (TID) | ORAL | Status: DC | PRN

## 2013-04-19 ENCOUNTER — Ambulatory Visit (INDEPENDENT_AMBULATORY_CARE_PROVIDER_SITE_OTHER): Payer: Managed Care, Other (non HMO) | Admitting: Internal Medicine

## 2013-04-19 ENCOUNTER — Encounter: Payer: Self-pay | Admitting: Internal Medicine

## 2013-04-19 VITALS — BP 124/80 | HR 72 | Temp 98.6°F | Resp 16 | Ht 64.0 in | Wt 173.0 lb

## 2013-04-19 DIAGNOSIS — M797 Fibromyalgia: Secondary | ICD-10-CM

## 2013-04-19 DIAGNOSIS — F322 Major depressive disorder, single episode, severe without psychotic features: Secondary | ICD-10-CM

## 2013-04-19 DIAGNOSIS — IMO0001 Reserved for inherently not codable concepts without codable children: Secondary | ICD-10-CM

## 2013-04-19 DIAGNOSIS — F329 Major depressive disorder, single episode, unspecified: Secondary | ICD-10-CM

## 2013-04-19 MED ORDER — ARIPIPRAZOLE 5 MG PO TABS: 5.0000 mg | ORAL_TABLET | Freq: Every day | ORAL | Status: DC

## 2013-04-19 NOTE — Assessment & Plan Note (Signed)
Mild improvement.  Continue same dose of cymbalta and gabapentin.  Consider repeating trigger point injection at next office visit.

## 2013-04-19 NOTE — Assessment & Plan Note (Signed)
Her symptoms not getting better.  She is working on getting appt with new psychiatrist at [facility].  Patient has been doing worse since discontinuing lithium. She has tried other mood stabilizer such as Lamictal in the past. However she had to discontinue Lamictal as it caused interstitial cystitis.  I suggest we add Abilify 5 mg. Patient to take half tablet for one week and then titrate to full tablet.

## 2013-04-19 NOTE — Progress Notes (Signed)
Subjective:    Patient ID: Katie [name], female    DOB: 1981/01/16, 32 y.o.   MRN: [id number]  HPI  32 year old white female with history of chronic fatigue syndrome, severe fibromyalgia and depression for followup. Patient had decent response to trigger point injections. However , her fibromyalgia symptoms returned in approximately 2 weeks.  Patient's depressive symptoms unchanged. She is back to taking 200 mg of Luvox and 120 mg of Cymbalta. Patient feels she has been doing worse since discontinuing her mood stabilizer (lithium). Patient reports her psychiatrist at [facility] questioned her previous diagnosis of possible bipolar disorder. They felt she mainly had major depressive disorder.   Review of Systems Negative for weight loss.  Sleep quality is worse.    Past Medical History  Diagnosis Date  . Asthma   . Bipolar affective disorder     onogoing Dr Maye Hides treatment for the past 10 years  . GERD (gastroesophageal reflux disease)   . Allergy   . Migraines     teenage onset with periods  . History of recurrent UTIs     cystoscopy with Dr Edwin Cap [facility]  . History of kidney stones     highschool    History   Social History  . Marital Status: Married    Spouse Name: N/A    Number of Children: N/A  . Years of Education: N/A   Occupational History  . preschool teacher and nanny    Social History Main Topics  . Smoking status: Never Smoker   . Smokeless tobacco: Never Used  . Alcohol Use: No  . Drug Use: No  . Sexual Activity: Yes    Birth Control/ Protection: Pill   Other Topics Concern  . Not on file   Social History Narrative   Grew up in [place]   Married 5 yrs   Occupation - prev occupation.  Preschool teacher and nanny   Never smoked.  No etoh no drugs    Past Surgical History  Procedure Laterality Date  . Adenoidectomy      1990  . Knee surgery      left knee torn cartilage removed 1994  . Hemangioma excision      2009 removal from  right ar    Family History  Problem Relation Age of Onset  . Alcohol abuse Paternal Grandmother   . Alcohol abuse Paternal Grandfather   . Lung cancer Paternal Grandfather   . Alcohol abuse Maternal Grandfather   . Prostate cancer Maternal Grandfather   . Osteoarthritis Mother   . Lymphoma Mother     Non-Hodgkin's lymphoma  . Hyperlipidemia Mother   . Hypertension Mother   . Diabetes Mother     type 2  . Osteoarthritis Father   . Other Father     Depression.anxiety  . Lung cancer Maternal Grandmother   . Diabetes Maternal Grandmother   . Diabetes Maternal Aunt     Allergies  Allergen Reactions  . Zonisamide Nausea And Vomiting    Current Outpatient Prescriptions on File Prior to Visit  Medication Sig Dispense Refill  . Cholecalciferol (VITAMIN D3) 2000 UNITS TABS Take 1 tablet by mouth daily.      . clonazePAM (KLONOPIN) 0.5 MG tablet       . DULoxetine (CYMBALTA) 60 MG capsule Take 2 capsules (120 mg total) by mouth daily.  180 capsule  1  . Fluvoxamine Maleate (LUVOX CR) 100 MG CP24 Take 1 capsule (100 mg total) by mouth at bedtime.  30  each    . gabapentin (NEURONTIN) 600 MG tablet Take 1 tablet (600 mg total) by mouth 3 (three) times daily.  90 tablet  3  . HYDROcodone-acetaminophen (NORCO/VICODIN) 5-325 MG per tablet Take 1 tablet by mouth 2 (two) times daily as needed for pain.  60 tablet  0  . Norgestim-Eth Estrad Triphasic (TRI-SPRINTEC) 0.18/0.215/0.25 MG-35 MCG TABS Take by mouth daily.        Marland Kitchen omeprazole (PRILOSEC) 40 MG capsule Take 1 capsule (40 mg total) by mouth daily.  90 capsule  1  . tiZANidine (ZANAFLEX) 2 MG tablet Take 0.5 tablets (1 mg total) by mouth every 8 (eight) hours as needed.  45 tablet  2  . traZODone (DESYREL) 100 MG tablet Take 1 tablet (100 mg total) by mouth at bedtime.  30 tablet  2   No current facility-administered medications on file prior to visit.    BP 124/80  Pulse 72  Temp(Src) 98.6 F (37 C)  Resp 16  Ht 5\' 4"  (1.626 m)   Wt 173 lb (78.472 kg)  BMI 29.68 kg/m2    Objective:   Physical Exam  Constitutional: She is oriented to person, place, and time. She appears well-developed and well-nourished.  Cardiovascular: Normal rate, regular rhythm and normal heart sounds.   No murmur heard. Pulmonary/Chest: Effort normal and breath sounds normal. She has no wheezes.  Musculoskeletal: She exhibits no edema.  Neurological: She is alert and oriented to person, place, and time. No cranial nerve deficit.  Psychiatric: She has a normal mood and affect.          Assessment & Plan:

## 2013-04-23 ENCOUNTER — Encounter: Payer: Self-pay | Admitting: Internal Medicine

## 2013-04-28 ENCOUNTER — Ambulatory Visit: Payer: Managed Care, Other (non HMO) | Admitting: Internal Medicine

## 2013-05-03 ENCOUNTER — Ambulatory Visit (INDEPENDENT_AMBULATORY_CARE_PROVIDER_SITE_OTHER): Payer: Managed Care, Other (non HMO) | Admitting: Internal Medicine

## 2013-05-03 ENCOUNTER — Ambulatory Visit: Payer: Managed Care, Other (non HMO) | Admitting: Internal Medicine

## 2013-05-03 ENCOUNTER — Encounter: Payer: Self-pay | Admitting: Internal Medicine

## 2013-05-03 VITALS — BP 120/80 | HR 102 | Temp 98.4°F | Resp 20 | Wt 173.0 lb

## 2013-05-03 DIAGNOSIS — R5382 Chronic fatigue, unspecified: Secondary | ICD-10-CM

## 2013-05-03 DIAGNOSIS — IMO0001 Reserved for inherently not codable concepts without codable children: Secondary | ICD-10-CM

## 2013-05-03 DIAGNOSIS — M797 Fibromyalgia: Secondary | ICD-10-CM

## 2013-05-03 MED ORDER — DULOXETINE HCL 60 MG PO CPEP: 60.0000 mg | ORAL_CAPSULE | Freq: Every day | ORAL | Status: DC

## 2013-05-03 MED ORDER — HYDROCODONE-ACETAMINOPHEN 5-325 MG PO TABS: 1.0000 | ORAL_TABLET | Freq: Three times a day (TID) | ORAL | Status: DC | PRN

## 2013-05-03 NOTE — Progress Notes (Signed)
Subjective:    Patient ID: Katie [name], female    DOB: Jul 13, 1981, 32 y.o.   MRN: [id number]  HPI  32 year old patient who is followed closely by psychiatry for severe depression. She is also followed for severe chronic fatigue and fibromyalgia syndrome. She is accompanied by her mother today with complaints of worsening pain. She has done poorly since down titration of hydrocodone which she is now taking just twice daily;  lithium also has been discontinued. She is sleeping poorly due to to pain  Past Medical History  Diagnosis Date  . Asthma   . Bipolar affective disorder     onogoing Dr Maye Hides treatment for the past 10 years  . GERD (gastroesophageal reflux disease)   . Allergy   . Migraines     teenage onset with periods  . History of recurrent UTIs     cystoscopy with Dr Edwin Cap [facility]  . History of kidney stones     highschool    History   Social History  . Marital Status: Married    Spouse Name: N/A    Number of Children: N/A  . Years of Education: N/A   Occupational History  . preschool teacher and nanny    Social History Main Topics  . Smoking status: Never Smoker   . Smokeless tobacco: Never Used  . Alcohol Use: No  . Drug Use: No  . Sexual Activity: Yes    Birth Control/ Protection: Pill   Other Topics Concern  . Not on file   Social History Narrative   Grew up in [place]   Married 5 yrs   Occupation - prev occupation.  Preschool teacher and nanny   Never smoked.  No etoh no drugs    Past Surgical History  Procedure Laterality Date  . Adenoidectomy      1990  . Knee surgery      left knee torn cartilage removed 1994  . Hemangioma excision      2009 removal from right ar    Family History  Problem Relation Age of Onset  . Alcohol abuse Paternal Grandmother   . Alcohol abuse Paternal Grandfather   . Lung cancer Paternal Grandfather   . Alcohol abuse Maternal Grandfather   . Prostate cancer Maternal Grandfather   .  Osteoarthritis Mother   . Lymphoma Mother     Non-Hodgkin's lymphoma  . Hyperlipidemia Mother   . Hypertension Mother   . Diabetes Mother     type 2  . Osteoarthritis Father   . Other Father     Depression.anxiety  . Lung cancer Maternal Grandmother   . Diabetes Maternal Grandmother   . Diabetes Maternal Aunt     Allergies  Allergen Reactions  . Zonisamide Nausea And Vomiting    Current Outpatient Prescriptions on File Prior to Visit  Medication Sig Dispense Refill  . Fluvoxamine Maleate (LUVOX CR) 100 MG CP24 Take 100 mg by mouth 2 (two) times daily.   30 each    . gabapentin (NEURONTIN) 600 MG tablet Take 1 tablet (600 mg total) by mouth 3 (three) times daily.  90 tablet  3  . Norgestim-Eth Estrad Triphasic (TRI-SPRINTEC) 0.18/0.215/0.25 MG-35 MCG TABS Take by mouth daily.        Marland Kitchen omeprazole (PRILOSEC) 40 MG capsule Take 1 capsule (40 mg total) by mouth daily.  90 capsule  1  . tiZANidine (ZANAFLEX) 2 MG tablet Take 0.5 tablets (1 mg total) by mouth every 8 (eight) hours as needed.  45 tablet  2  . traZODone (DESYREL) 100 MG tablet Take 1 tablet (100 mg total) by mouth at bedtime.  30 tablet  2  . Cholecalciferol (VITAMIN D3) 2000 UNITS TABS Take 1 tablet by mouth daily.       No current facility-administered medications on file prior to visit.    BP 120/80  Pulse 102  Temp(Src) 98.4 F (36.9 C) (Oral)  Resp 20  Wt 173 lb (78.472 kg)  BMI 29.68 kg/m2  SpO2 97%  LMP 04/13/2013      Review of Systems  Musculoskeletal: Positive for myalgias.  Neurological: Positive for weakness.  Psychiatric/Behavioral: Positive for sleep disturbance, dysphoric mood and decreased concentration. The patient is nervous/anxious.        Objective:   Physical Exam  Constitutional: She is oriented to person, place, and time. She appears well-developed and well-nourished. No distress.  Blood pressure well controlled  Neck: Neck supple. Thyromegaly present.  Cardiovascular: Normal  rate and regular rhythm.   Pulmonary/Chest: Effort normal and breath sounds normal.  Neurological: She is alert and oriented to person, place, and time. She has normal reflexes.  Psychiatric:  Dysphoric mood          Assessment & Plan:   Chronic fatigue and fibromyalgia syndrome.  Will incr vicodin to [facility] prn regimen with a HS dose; F/u Psych Decr cymbalta to 60 mg daily ROV 4 weeks

## 2013-05-24 ENCOUNTER — Encounter: Payer: Self-pay | Admitting: Internal Medicine

## 2013-05-31 ENCOUNTER — Ambulatory Visit: Payer: Managed Care, Other (non HMO) | Admitting: Internal Medicine

## 2013-06-01 ENCOUNTER — Ambulatory Visit: Payer: Managed Care, Other (non HMO) | Admitting: Internal Medicine

## 2013-06-03 ENCOUNTER — Encounter: Payer: Self-pay | Admitting: Internal Medicine

## 2013-06-06 ENCOUNTER — Ambulatory Visit (INDEPENDENT_AMBULATORY_CARE_PROVIDER_SITE_OTHER): Payer: Managed Care, Other (non HMO) | Admitting: Internal Medicine

## 2013-06-06 ENCOUNTER — Encounter: Payer: Self-pay | Admitting: Internal Medicine

## 2013-06-06 ENCOUNTER — Telehealth: Payer: Self-pay | Admitting: Internal Medicine

## 2013-06-06 VITALS — BP 114/70 | HR 72 | Temp 98.6°F | Ht 64.0 in | Wt 177.0 lb

## 2013-06-06 DIAGNOSIS — Z5181 Encounter for therapeutic drug level monitoring: Secondary | ICD-10-CM

## 2013-06-06 DIAGNOSIS — M797 Fibromyalgia: Secondary | ICD-10-CM

## 2013-06-06 DIAGNOSIS — R5382 Chronic fatigue, unspecified: Secondary | ICD-10-CM

## 2013-06-06 DIAGNOSIS — F319 Bipolar disorder, unspecified: Secondary | ICD-10-CM

## 2013-06-06 MED ORDER — NALTREXONE HCL 50 MG PO TABS: 50.0000 mg | ORAL_TABLET | Freq: Every day | ORAL | Status: DC

## 2013-06-06 NOTE — Progress Notes (Signed)
Pre visit review using our clinic review tool, if applicable. No additional management support is needed unless otherwise documented below in the visit note. 

## 2013-06-06 NOTE — Telephone Encounter (Signed)
Can you put in the orders for pt's labs? Labs were hand written in and I did not get them all and orders not in computer.

## 2013-06-06 NOTE — Assessment & Plan Note (Addendum)
Cymbalta discontinued by her psychiatrist.  It was not beneficial for fibromyalgia or depression.  She is currently using vicodin 5/500 tid.  I would like to avoid long term use of narcotics.  Transition for naltrexone 50 mg.  She is using over the counter herbal anti virals.  Monitor CBCD, LFTs and BMET.

## 2013-06-06 NOTE — Progress Notes (Signed)
Subjective:    Patient ID: Katie [name], female    DOB: Aug 24, 1980, 32 y.o.   MRN: [id number]  HPI  32 year old white female with hx of bipolar disorder, CFS, and severe fibromyalgia for follow up.  Patient was seen by psychiatrist.  Cymbalta was tapered off.  It did not help at all with fibromyalgia pain. She has been using hydrocodone 3 x per day.    Her fibromyalgia pain and chronic fatigue exacerbated by minimal physical activity.  Bipolar disorder - psychiatrist planning to restart Lithium for mood stabilization.    Review of Systems  Negative for changes in weight.  Sleeping better with trazadone.  Past Medical History  Diagnosis Date  . Asthma   . Bipolar affective disorder     onogoing Dr Maye Hides treatment for the past 10 years  . GERD (gastroesophageal reflux disease)   . Allergy   . Migraines     teenage onset with periods  . History of recurrent UTIs     cystoscopy with Dr Edwin Cap [facility]  . History of kidney stones     highschool    History   Social History  . Marital Status: Married    Spouse Name: N/A    Number of Children: N/A  . Years of Education: N/A   Occupational History  . preschool teacher and nanny    Social History Main Topics  . Smoking status: Never Smoker   . Smokeless tobacco: Never Used  . Alcohol Use: No  . Drug Use: No  . Sexual Activity: Yes    Birth Control/ Protection: Pill   Other Topics Concern  . Not on file   Social History Narrative   Grew up in [place]   Married 5 yrs   Occupation - prev occupation.  Preschool teacher and nanny   Never smoked.  No etoh no drugs    Past Surgical History  Procedure Laterality Date  . Adenoidectomy      1990  . Knee surgery      left knee torn cartilage removed 1994  . Hemangioma excision      2009 removal from right ar    Family History  Problem Relation Age of Onset  . Alcohol abuse Paternal Grandmother   . Alcohol abuse Paternal Grandfather   . Lung cancer  Paternal Grandfather   . Alcohol abuse Maternal Grandfather   . Prostate cancer Maternal Grandfather   . Osteoarthritis Mother   . Lymphoma Mother     Non-Hodgkin's lymphoma  . Hyperlipidemia Mother   . Hypertension Mother   . Diabetes Mother     type 2  . Osteoarthritis Father   . Other Father     Depression.anxiety  . Lung cancer Maternal Grandmother   . Diabetes Maternal Grandmother   . Diabetes Maternal Aunt     Allergies  Allergen Reactions  . Zonisamide Nausea And Vomiting    Current Outpatient Prescriptions on File Prior to Visit  Medication Sig Dispense Refill  . Cholecalciferol (VITAMIN D3) 2000 UNITS TABS Take 1 tablet by mouth daily.      . Fluvoxamine Maleate (LUVOX CR) 100 MG CP24 Take 100 mg by mouth 2 (two) times daily.   30 each    . gabapentin (NEURONTIN) 600 MG tablet Take 1 tablet (600 mg total) by mouth 3 (three) times daily.  90 tablet  3  . hydrOXYzine (ATARAX/VISTARIL) 25 MG tablet 25 mg every 8 (eight) hours as needed. 50 mg at Bedtime      .  Norgestim-Eth Estrad Triphasic (TRI-SPRINTEC) 0.18/0.215/0.25 MG-35 MCG TABS Take by mouth daily.        Marland Kitchen omeprazole (PRILOSEC) 40 MG capsule Take 1 capsule (40 mg total) by mouth daily.  90 capsule  1  . tiZANidine (ZANAFLEX) 2 MG tablet Take 0.5 tablets (1 mg total) by mouth every 8 (eight) hours as needed.  45 tablet  2  . traZODone (DESYREL) 100 MG tablet Take 1 tablet (100 mg total) by mouth at bedtime.  30 tablet  2   No current facility-administered medications on file prior to visit.    BP 114/70  Pulse 72  Temp(Src) 98.6 F (37 C) (Oral)  Ht 5\' 4"  (1.626 m)  Wt 177 lb (80.287 kg)  BMI 30.37 kg/m2       Objective:   Physical Exam  Constitutional: She is oriented to person, place, and time. She appears well-developed and well-nourished.  HENT:  Head: Normocephalic and atraumatic.  Cardiovascular: Normal rate, regular rhythm and normal heart sounds.   No murmur heard. Pulmonary/Chest: Effort  normal and breath sounds normal. She has no wheezes.  Musculoskeletal:  Multiple tender points upper back  Neurological: She is alert and oriented to person, place, and time. No cranial nerve deficit.  Psychiatric: She has a normal mood and affect. Her behavior is normal.          Assessment & Plan:

## 2013-06-06 NOTE — Assessment & Plan Note (Signed)
Her psychiatrist planning to restart Lithium

## 2013-06-16 ENCOUNTER — Encounter: Payer: Self-pay | Admitting: Internal Medicine

## 2013-06-17 ENCOUNTER — Other Ambulatory Visit: Payer: Self-pay | Admitting: Internal Medicine

## 2013-06-17 ENCOUNTER — Telehealth: Payer: Self-pay | Admitting: Internal Medicine

## 2013-06-17 MED ORDER — HYDROCODONE-ACETAMINOPHEN 5-325 MG PO TABS: 1.0000 | ORAL_TABLET | Freq: Four times a day (QID) | ORAL | Status: DC | PRN

## 2013-06-17 NOTE — Telephone Encounter (Signed)
Pt states the pain med dr Artist Pais rx her is not working (naltrexone (DEPADE) 50 MG tablet)   and she is still in a lot of pain.

## 2013-06-17 NOTE — Telephone Encounter (Signed)
Spoke with pt.  She will come in now to pick up rx.

## 2013-06-17 NOTE — Telephone Encounter (Signed)
Pt would also like to know if she should come in for appt on mon after this refill?

## 2013-07-13 ENCOUNTER — Encounter: Payer: Self-pay | Admitting: Internal Medicine

## 2013-07-13 ENCOUNTER — Ambulatory Visit (INDEPENDENT_AMBULATORY_CARE_PROVIDER_SITE_OTHER): Payer: Managed Care, Other (non HMO) | Admitting: Internal Medicine

## 2013-07-13 VITALS — BP 122/80 | HR 92 | Temp 98.6°F | Ht 64.0 in | Wt 177.0 lb

## 2013-07-13 DIAGNOSIS — IMO0001 Reserved for inherently not codable concepts without codable children: Secondary | ICD-10-CM

## 2013-07-13 DIAGNOSIS — M797 Fibromyalgia: Secondary | ICD-10-CM

## 2013-07-13 DIAGNOSIS — M2550 Pain in unspecified joint: Secondary | ICD-10-CM

## 2013-07-13 DIAGNOSIS — R5382 Chronic fatigue, unspecified: Secondary | ICD-10-CM

## 2013-07-13 MED ORDER — NAPROXEN SODIUM 220 MG PO TABS: 220.0000 mg | ORAL_TABLET | Freq: Every day | ORAL | Status: DC | PRN

## 2013-07-13 MED ORDER — FISH OIL 1000 MG PO CAPS: ORAL_CAPSULE | ORAL | Status: DC

## 2013-07-13 MED ORDER — HYDROCODONE-ACETAMINOPHEN 5-325 MG PO TABS: 1.0000 | ORAL_TABLET | Freq: Four times a day (QID) | ORAL | Status: DC | PRN

## 2013-07-13 NOTE — Progress Notes (Signed)
Pre visit review using our clinic review tool, if applicable. No additional management support is needed unless otherwise documented below in the visit note. 

## 2013-07-13 NOTE — Assessment & Plan Note (Addendum)
Patient suffered adverse reaction from naltrexone. She had to resume vicodin.  Patient advised to try high-dose fish oil as well as higher dose of vitamin d. Refer to pain management specialist-Dr. Vear Clock.  Patient also advised to use naproxen 220 mg-1 tab daily. Patient understands to take medication with food and plenty of water.  Use of NSAIDs may be limited if she restarts lithium therapy.

## 2013-07-13 NOTE — Patient Instructions (Signed)
Take higher dose of vitamin D as directed

## 2013-07-13 NOTE — Progress Notes (Signed)
Subjective:    Patient ID: Katie [name], female    DOB: 02/11/1981, 32 y.o.   MRN: [id number]  HPI  32 year old white female with history of chronic fibromyalgia and bipolar disorder for followup. Patient has been experiencing ongoing flare of joint pains since previous visit. Her symptoms are worse in her shoulders and particularly in her wrists bilaterally. There is no swelling or redness in her joints.  Patient continues to struggle with chronic fatigue.  She has upcoming followup visit with her psychiatrist. She is considering restarting lithium.  Review of Systems Negative for fever chills  Past Medical History  Diagnosis Date  . Asthma   . Bipolar affective disorder     onogoing Dr Maye Hides treatment for the past 10 years  . GERD (gastroesophageal reflux disease)   . Allergy   . Migraines     teenage onset with periods  . History of recurrent UTIs     cystoscopy with Dr Edwin Cap [facility]  . History of kidney stones     highschool    History   Social History  . Marital Status: Married    Spouse Name: N/A    Number of Children: N/A  . Years of Education: N/A   Occupational History  . preschool teacher and nanny    Social History Main Topics  . Smoking status: Never Smoker   . Smokeless tobacco: Never Used  . Alcohol Use: No  . Drug Use: No  . Sexual Activity: Yes    Birth Control/ Protection: Pill   Other Topics Concern  . Not on file   Social History Narrative   Grew up in [place]   Married 5 yrs   Occupation - prev occupation.  Preschool teacher and nanny   Never smoked.  No etoh no drugs    Past Surgical History  Procedure Laterality Date  . Adenoidectomy      1990  . Knee surgery      left knee torn cartilage removed 1994  . Hemangioma excision      2009 removal from right ar    Family History  Problem Relation Age of Onset  . Alcohol abuse Paternal Grandmother   . Alcohol abuse Paternal Grandfather   . Lung cancer Paternal  Grandfather   . Alcohol abuse Maternal Grandfather   . Prostate cancer Maternal Grandfather   . Osteoarthritis Mother   . Lymphoma Mother     Non-Hodgkin's lymphoma  . Hyperlipidemia Mother   . Hypertension Mother   . Diabetes Mother     type 2  . Osteoarthritis Father   . Other Father     Depression.anxiety  . Lung cancer Maternal Grandmother   . Diabetes Maternal Grandmother   . Diabetes Maternal Aunt     Allergies  Allergen Reactions  . Zonisamide Nausea And Vomiting    Current Outpatient Prescriptions on File Prior to Visit  Medication Sig Dispense Refill  . Cholecalciferol (VITAMIN D3) 2000 UNITS TABS Take 1 tablet by mouth daily.      . clonazePAM (KLONOPIN) 1 MG tablet Take 1 tablet by mouth daily.      . Fluvoxamine Maleate (LUVOX CR) 100 MG CP24 Take 100 mg by mouth 2 (two) times daily.   30 each    . gabapentin (NEURONTIN) 600 MG tablet Take 1 tablet (600 mg total) by mouth 3 (three) times daily.  90 tablet  3  . hydrOXYzine (ATARAX/VISTARIL) 25 MG tablet 25 mg every 8 (eight) hours as  needed. 50 mg at Bedtime      . Norgestim-Eth Estrad Triphasic (TRI-SPRINTEC) 0.18/0.215/0.25 MG-35 MCG TABS Take by mouth daily.        Marland Kitchen omeprazole (PRILOSEC) 40 MG capsule Take 1 capsule (40 mg total) by mouth daily.  90 capsule  1  . tiZANidine (ZANAFLEX) 2 MG tablet Take 0.5 tablets (1 mg total) by mouth every 8 (eight) hours as needed.  45 tablet  2  . traZODone (DESYREL) 100 MG tablet Take 1 tablet (100 mg total) by mouth at bedtime.  30 tablet  2   No current facility-administered medications on file prior to visit.    BP 122/80  Pulse 92  Temp(Src) 98.6 F (37 C) (Oral)  Ht 5\' 4"  (1.626 m)  Wt 177 lb (80.287 kg)  BMI 30.37 kg/m2       Objective:   Physical Exam  Constitutional: She appears well-developed and well-nourished.  Cardiovascular: Normal rate, regular rhythm and normal heart sounds.   No murmur heard. Pulmonary/Chest: Effort normal and breath sounds  normal. She has no wheezes.  Musculoskeletal:  Tenderness of both wrists with flexion and extension but no redness or swelling  Skin: Skin is warm and dry.  Psychiatric: She has a normal mood and affect. Her behavior is normal.          Assessment & Plan:

## 2013-07-15 ENCOUNTER — Other Ambulatory Visit (INDEPENDENT_AMBULATORY_CARE_PROVIDER_SITE_OTHER): Payer: Managed Care, Other (non HMO)

## 2013-07-15 DIAGNOSIS — Z5181 Encounter for therapeutic drug level monitoring: Secondary | ICD-10-CM

## 2013-07-15 LAB — HEPATIC FUNCTION PANEL
ALK PHOS: 90 U/L (ref 39–117)
ALT: 17 U/L (ref 0–35)
AST: 19 U/L (ref 0–37)
Albumin: 3.8 g/dL (ref 3.5–5.2)
Bilirubin, Direct: 0 mg/dL (ref 0.0–0.3)
TOTAL PROTEIN: 7 g/dL (ref 6.0–8.3)
Total Bilirubin: 0.3 mg/dL (ref 0.3–1.2)

## 2013-07-15 LAB — CBC WITH DIFFERENTIAL/PLATELET
BASOS ABS: 0 10*3/uL (ref 0.0–0.1)
BASOS PCT: 0.2 % (ref 0.0–3.0)
EOS PCT: 0.9 % (ref 0.0–5.0)
Eosinophils Absolute: 0.1 10*3/uL (ref 0.0–0.7)
HEMATOCRIT: 36.5 % (ref 36.0–46.0)
Hemoglobin: 12.2 g/dL (ref 12.0–15.0)
LYMPHS ABS: 2.7 10*3/uL (ref 0.7–4.0)
LYMPHS PCT: 25.1 % (ref 12.0–46.0)
MCHC: 33.3 g/dL (ref 30.0–36.0)
MCV: 83.7 fl (ref 78.0–100.0)
MONOS PCT: 6 % (ref 3.0–12.0)
Monocytes Absolute: 0.6 10*3/uL (ref 0.1–1.0)
NEUTROS ABS: 7.2 10*3/uL (ref 1.4–7.7)
Neutrophils Relative %: 67.8 % (ref 43.0–77.0)
Platelets: 390 10*3/uL (ref 150.0–400.0)
RBC: 4.36 Mil/uL (ref 3.87–5.11)
RDW: 12.9 % (ref 11.5–14.6)
WBC: 10.6 10*3/uL — ABNORMAL HIGH (ref 4.5–10.5)

## 2013-07-15 LAB — BASIC METABOLIC PANEL
BUN: 14 mg/dL (ref 6–23)
CO2: 27 mEq/L (ref 19–32)
Calcium: 9.2 mg/dL (ref 8.4–10.5)
Chloride: 104 mEq/L (ref 96–112)
Creatinine, Ser: 0.8 mg/dL (ref 0.4–1.2)
GFR: 89.35 mL/min (ref >60.00)
GLUCOSE: 95 mg/dL (ref 70–99)
POTASSIUM: 4 meq/L (ref 3.5–5.1)
Sodium: 138 mEq/L (ref 135–145)

## 2013-07-20 ENCOUNTER — Ambulatory Visit: Payer: Managed Care, Other (non HMO) | Admitting: Internal Medicine

## 2013-08-11 ENCOUNTER — Other Ambulatory Visit: Payer: Self-pay | Admitting: Internal Medicine

## 2013-08-17 ENCOUNTER — Encounter: Payer: Self-pay | Admitting: Internal Medicine

## 2013-08-23 ENCOUNTER — Encounter: Payer: Self-pay | Admitting: Internal Medicine

## 2013-08-25 ENCOUNTER — Encounter: Payer: Self-pay | Admitting: Internal Medicine

## 2013-08-25 DIAGNOSIS — M542 Cervicalgia: Secondary | ICD-10-CM

## 2013-08-25 DIAGNOSIS — M797 Fibromyalgia: Secondary | ICD-10-CM

## 2013-08-31 ENCOUNTER — Ambulatory Visit (INDEPENDENT_AMBULATORY_CARE_PROVIDER_SITE_OTHER): Payer: Managed Care, Other (non HMO) | Admitting: Internal Medicine

## 2013-08-31 ENCOUNTER — Encounter: Payer: Self-pay | Admitting: Internal Medicine

## 2013-08-31 VITALS — BP 108/70 | HR 76 | Temp 98.6°F | Ht 64.0 in | Wt 185.0 lb

## 2013-08-31 DIAGNOSIS — M797 Fibromyalgia: Secondary | ICD-10-CM

## 2013-08-31 DIAGNOSIS — G9332 Myalgic encephalomyelitis/chronic fatigue syndrome: Secondary | ICD-10-CM

## 2013-08-31 DIAGNOSIS — R5382 Chronic fatigue, unspecified: Secondary | ICD-10-CM

## 2013-08-31 MED ORDER — HYDROCODONE-ACETAMINOPHEN 5-325 MG PO TABS: 1.0000 | ORAL_TABLET | Freq: Four times a day (QID) | ORAL | Status: DC | PRN

## 2013-08-31 NOTE — Progress Notes (Signed)
Subjective:    Patient ID: Katie [name], female    DOB: Nov 18, 1980, 33 y.o.   MRN: [id number]  HPI  33 year old white female with history of chronic fibromyalgia pain and bipolar disorder for followup. Interval medical history-patient was seen by her psychiatrist. Her psychiatrist decided not to restart lithium for multiple reasons. Patient getting some pain relief with the use of naproxen twice daily which has significant interaction with lithium.  Since previous visit patient reports flare up of her severe fatigue and fibromyalgia pain. Her pain is particularly worse on left side of thoracic spine up to left shoulder area.  Patient reports her symptoms got worse after visiting family in [place] and a bout of viral gastroenteritis.   Review of Systems Negative for fever,  Severe fatigue.  Hard to even sit up due to fatigue    Past Medical History  Diagnosis Date  . Asthma   . Bipolar affective disorder     onogoing Dr Nicholaus Corolla treatment for the past 10 years  . GERD (gastroesophageal reflux disease)   . Allergy   . Migraines     teenage onset with periods  . History of recurrent UTIs     cystoscopy with Dr Barnie Del [facility]  . History of kidney stones     highschool    History   Social History  . Marital Status: Married    Spouse Name: N/A    Number of Children: N/A  . Years of Education: N/A   Occupational History  . preschool teacher and nanny    Social History Main Topics  . Smoking status: Never Smoker   . Smokeless tobacco: Never Used  . Alcohol Use: No  . Drug Use: No  . Sexual Activity: Yes    Birth Control/ Protection: Pill   Other Topics Concern  . Not on file   Social History Narrative   Grew up in [place]   Married 5 yrs   Occupation - prev occupation.  Preschool teacher and nanny   Never smoked.  No etoh no drugs    Past Surgical History  Procedure Laterality Date  . Adenoidectomy      1990  . Knee surgery      left knee torn  cartilage removed 1994  . Hemangioma excision      2009 removal from right ar    Family History  Problem Relation Age of Onset  . Alcohol abuse Paternal Grandmother   . Alcohol abuse Paternal Grandfather   . Lung cancer Paternal Grandfather   . Alcohol abuse Maternal Grandfather   . Prostate cancer Maternal Grandfather   . Osteoarthritis Mother   . Lymphoma Mother     Non-Hodgkin's lymphoma  . Hyperlipidemia Mother   . Hypertension Mother   . Diabetes Mother     type 2  . Osteoarthritis Father   . Other Father     Depression.anxiety  . Lung cancer Maternal Grandmother   . Diabetes Maternal Grandmother   . Diabetes Maternal Aunt     Allergies  Allergen Reactions  . Zonisamide Nausea And Vomiting    Current Outpatient Prescriptions on File Prior to Visit  Medication Sig Dispense Refill  . Cholecalciferol (VITAMIN D3) 2000 UNITS TABS Take 1 tablet by mouth daily.      . Fluvoxamine Maleate (LUVOX CR) 100 MG CP24 Take 100 mg by mouth 2 (two) times daily.   30 each    . gabapentin (NEURONTIN) 600 MG tablet Take 1 tablet (600  mg total) by mouth 3 (three) times daily.  90 tablet  3  . hydrOXYzine (ATARAX/VISTARIL) 25 MG tablet 25 mg every 8 (eight) hours as needed. 50 mg at Bedtime      . naproxen sodium (EQ NAPROXEN SODIUM) 220 MG tablet Take 1 tablet (220 mg total) by mouth daily as needed.      Lenard Forth Triphasic (TRI-SPRINTEC) 0.18/0.215/0.25 MG-35 MCG TABS Take by mouth daily.        . Omega-3 Fatty Acids (FISH OIL) 1000 MG CAPS 3 caps twice daily    0  . omeprazole (PRILOSEC) 40 MG capsule TAKE ONE CAPSULE BY MOUTH ONE TIME DAILY   90 capsule  1  . tiZANidine (ZANAFLEX) 2 MG tablet Take 0.5 tablets (1 mg total) by mouth every 8 (eight) hours as needed.  45 tablet  2  . traZODone (DESYREL) 100 MG tablet Take 1 tablet (100 mg total) by mouth at bedtime.  30 tablet  2   No current facility-administered medications on file prior to visit.    BP 108/70  Pulse  76  Temp(Src) 98.6 F (37 C) (Oral)  Ht 5\' 4"  (1.626 m)  Wt 185 lb (83.915 kg)  BMI 31.74 kg/m2    Objective:   Physical Exam  Constitutional: She is oriented to person, place, and time. She appears well-developed and well-nourished. No distress.  Cardiovascular: Normal rate, regular rhythm and normal heart sounds.   No murmur heard. Pulmonary/Chest: Effort normal and breath sounds normal. She has no wheezes.  Musculoskeletal:  Several trigger point tenderness left paraspinal muscles (mid thoracic level up to left trapezius)  Neurological: She is alert and oriented to person, place, and time.          Assessment & Plan:

## 2013-08-31 NOTE — Progress Notes (Signed)
Pre visit review using our clinic review tool, if applicable. No additional management support is needed unless otherwise documented below in the visit note. 

## 2013-08-31 NOTE — Assessment & Plan Note (Signed)
Patient experiencing postexertional malaise after visiting her family in [place]. Her symptoms also exacerbated by recent episode of viral gastroenteritis. She has significant painful trigger points near left thoracic paraspinal musculature up to left shoulder. She has responded well to trigger point injections in the past. 3 trigger points injected with 1 ML of Marcaine using aseptic technique.  Rest and pacing advised. She was also referred to Dr. Vira Blanco - pain mgt to consider other treatment possibilities.   Refilled hydrocodone/APAP.

## 2013-09-02 ENCOUNTER — Other Ambulatory Visit: Payer: Self-pay | Admitting: Internal Medicine

## 2013-09-02 DIAGNOSIS — D239 Other benign neoplasm of skin, unspecified: Secondary | ICD-10-CM

## 2013-09-12 ENCOUNTER — Ambulatory Visit: Payer: Managed Care, Other (non HMO) | Admitting: Internal Medicine

## 2013-09-21 ENCOUNTER — Encounter: Payer: Self-pay | Admitting: Internal Medicine

## 2013-09-28 ENCOUNTER — Other Ambulatory Visit: Payer: Self-pay | Admitting: Internal Medicine

## 2013-10-07 ENCOUNTER — Encounter: Payer: Self-pay | Admitting: Internal Medicine

## 2013-10-07 MED ORDER — GABAPENTIN 600 MG PO TABS: ORAL_TABLET | ORAL | Status: DC

## 2013-10-07 MED ORDER — TIZANIDINE HCL 2 MG PO TABS: 1.0000 mg | ORAL_TABLET | Freq: Three times a day (TID) | ORAL | Status: DC | PRN

## 2013-10-07 MED ORDER — TRAZODONE HCL 100 MG PO TABS: 100.0000 mg | ORAL_TABLET | Freq: Every day | ORAL | Status: DC

## 2013-10-07 MED ORDER — OMEPRAZOLE 40 MG PO CPDR: DELAYED_RELEASE_CAPSULE | ORAL | Status: DC

## 2013-10-31 ENCOUNTER — Encounter: Payer: Self-pay | Admitting: Internal Medicine

## 2013-10-31 ENCOUNTER — Ambulatory Visit (INDEPENDENT_AMBULATORY_CARE_PROVIDER_SITE_OTHER): Payer: Managed Care, Other (non HMO) | Admitting: Internal Medicine

## 2013-10-31 VITALS — BP 116/74 | HR 76 | Temp 98.4°F | Ht 64.0 in | Wt 186.0 lb

## 2013-10-31 DIAGNOSIS — IMO0001 Reserved for inherently not codable concepts without codable children: Secondary | ICD-10-CM

## 2013-10-31 DIAGNOSIS — M797 Fibromyalgia: Secondary | ICD-10-CM

## 2013-10-31 DIAGNOSIS — K219 Gastro-esophageal reflux disease without esophagitis: Secondary | ICD-10-CM

## 2013-10-31 MED ORDER — RANITIDINE HCL 150 MG PO TABS: 150.0000 mg | ORAL_TABLET | Freq: Two times a day (BID) | ORAL | Status: DC

## 2013-10-31 MED ORDER — HYDROCODONE-ACETAMINOPHEN 5-325 MG PO TABS: 1.0000 | ORAL_TABLET | Freq: Four times a day (QID) | ORAL | Status: DC | PRN

## 2013-10-31 MED ORDER — GABAPENTIN 600 MG PO TABS: ORAL_TABLET | ORAL | Status: DC

## 2013-10-31 NOTE — Progress Notes (Signed)
Subjective:    Patient ID: Katie [name], female    DOB: December 20, 1980, 33 y.o.   MRN: [id number]  HPI  33 year old white female with history of chronic her myalgia pain and bipolar disorder for followup. Interval history-patient seen by acupuncturist. Patient reports several sessions and it is helping with her musculoskeletal pain. She is using less Naprosyn and hydrocodone.  Patient reports intermittent episodes of nausea vomiting and diarrhea. No specific trigger. She is not currently having any symptoms.  Patient accompanied by supportive husband. They're considering trying to have a baby within the next 6-12 months.  Review of Systems Weight is stable    Past Medical History  Diagnosis Date  . Asthma   . Bipolar affective disorder     onogoing Dr Nicholaus Corolla treatment for the past 10 years  . GERD (gastroesophageal reflux disease)   . Allergy   . Migraines     teenage onset with periods  . History of recurrent UTIs     cystoscopy with Dr Barnie Del [facility]  . History of kidney stones     highschool    History   Social History  . Marital Status: Married    Spouse Name: N/A    Number of Children: N/A  . Years of Education: N/A   Occupational History  . preschool teacher and nanny    Social History Main Topics  . Smoking status: Never Smoker   . Smokeless tobacco: Never Used  . Alcohol Use: No  . Drug Use: No  . Sexual Activity: Yes    Birth Control/ Protection: Pill   Other Topics Concern  . Not on file   Social History Narrative   Grew up in [place]   Married 5 yrs   Occupation - prev occupation.  Preschool teacher and nanny   Never smoked.  No etoh no drugs    Past Surgical History  Procedure Laterality Date  . Adenoidectomy      1990  . Knee surgery      left knee torn cartilage removed 1994  . Hemangioma excision      2009 removal from right ar    Family History  Problem Relation Age of Onset  . Alcohol abuse Paternal Grandmother   .  Alcohol abuse Paternal Grandfather   . Lung cancer Paternal Grandfather   . Alcohol abuse Maternal Grandfather   . Prostate cancer Maternal Grandfather   . Osteoarthritis Mother   . Lymphoma Mother     Non-Hodgkin's lymphoma  . Hyperlipidemia Mother   . Hypertension Mother   . Diabetes Mother     type 2  . Osteoarthritis Father   . Other Father     Depression.anxiety  . Lung cancer Maternal Grandmother   . Diabetes Maternal Grandmother   . Diabetes Maternal Aunt     Allergies  Allergen Reactions  . Zonisamide Nausea And Vomiting    Current Outpatient Prescriptions on File Prior to Visit  Medication Sig Dispense Refill  . Cholecalciferol (VITAMIN D3) 2000 UNITS TABS Take 1 tablet by mouth daily.      . Fluvoxamine Maleate (LUVOX CR) 100 MG CP24 Take 100 mg by mouth 2 (two) times daily.   30 each    . hydrOXYzine (ATARAX/VISTARIL) 25 MG tablet 25 mg every 8 (eight) hours as needed. 50 mg at Bedtime      . naproxen sodium (EQ NAPROXEN SODIUM) 220 MG tablet Take 1 tablet (220 mg total) by mouth daily as needed.      Marland Kitchen  Norgestim-Eth Estrad Triphasic (TRI-SPRINTEC) 0.18/0.215/0.25 MG-35 MCG TABS Take by mouth daily.        . Omega-3 Fatty Acids (FISH OIL) 1000 MG CAPS 3 caps twice daily    0  . tiZANidine (ZANAFLEX) 2 MG tablet Take 0.5 tablets (1 mg total) by mouth every 8 (eight) hours as needed.  45 tablet  2  . traZODone (DESYREL) 100 MG tablet Take 1 tablet (100 mg total) by mouth at bedtime.  90 tablet  0   No current facility-administered medications on file prior to visit.    BP 116/74  Pulse 76  Temp(Src) 98.4 F (36.9 C) (Oral)  Ht 5\' 4"  (1.626 m)  Wt 186 lb (84.369 kg)  BMI 31.91 kg/m2    Objective:   Physical Exam  Constitutional: She is oriented to person, place, and time. She appears well-developed and well-nourished.  Cardiovascular: Normal rate, regular rhythm and normal heart sounds.   Pulmonary/Chest: Effort normal and breath sounds normal. She has no  wheezes.  Musculoskeletal: She exhibits no edema.  Neurological: She is alert and oriented to person, place, and time. No cranial nerve deficit.  Psychiatric: She has a normal mood and affect. Her behavior is normal.          Assessment & Plan:

## 2013-10-31 NOTE — Progress Notes (Signed)
Pre visit review using our clinic review tool, if applicable. No additional management support is needed unless otherwise documented below in the visit note. 

## 2013-10-31 NOTE — Assessment & Plan Note (Signed)
She is currently using omeprazole 40 mg QOD.  She reports infrequent symptoms.  Transition to Ranitidine 150 mg twice daily.

## 2013-10-31 NOTE — Assessment & Plan Note (Addendum)
Good response to acupuncture.  Patient also to consider using chinese herbs.  We discussed potential for herb - drug interactions.  Stop NSAIDs considering unexplained nausea, vomiting and diarrhea.  Use hydrocodone / apap sparingly.

## 2013-10-31 NOTE — Patient Instructions (Addendum)
Discuss tapering off your psychiatric medications with your psychiatrist Start taking prenatal vitamins daily

## 2013-11-20 ENCOUNTER — Encounter: Payer: Self-pay | Admitting: Internal Medicine

## 2013-11-21 MED ORDER — OMEPRAZOLE 40 MG PO CPDR: 40.0000 mg | DELAYED_RELEASE_CAPSULE | Freq: Every day | ORAL | Status: DC

## 2013-12-26 ENCOUNTER — Encounter: Payer: Self-pay | Admitting: Internal Medicine

## 2013-12-26 ENCOUNTER — Other Ambulatory Visit: Payer: Self-pay | Admitting: Internal Medicine

## 2013-12-26 DIAGNOSIS — K8020 Calculus of gallbladder without cholecystitis without obstruction: Secondary | ICD-10-CM

## 2013-12-26 MED ORDER — HYDROCODONE-ACETAMINOPHEN 5-325 MG PO TABS: 1.0000 | ORAL_TABLET | Freq: Four times a day (QID) | ORAL | Status: DC | PRN

## 2014-01-16 ENCOUNTER — Encounter: Payer: Self-pay | Admitting: Family Medicine

## 2014-01-16 ENCOUNTER — Ambulatory Visit (INDEPENDENT_AMBULATORY_CARE_PROVIDER_SITE_OTHER): Payer: Managed Care, Other (non HMO) | Admitting: Family Medicine

## 2014-01-16 VITALS — BP 124/80 | HR 100 | Temp 99.0°F | Wt 189.0 lb

## 2014-01-16 DIAGNOSIS — R197 Diarrhea, unspecified: Secondary | ICD-10-CM

## 2014-01-16 DIAGNOSIS — R112 Nausea with vomiting, unspecified: Secondary | ICD-10-CM

## 2014-01-16 DIAGNOSIS — R1033 Periumbilical pain: Secondary | ICD-10-CM

## 2014-01-16 MED ORDER — ONDANSETRON 8 MG PO TBDP: 8.0000 mg | ORAL_TABLET | Freq: Three times a day (TID) | ORAL | Status: DC | PRN

## 2014-01-16 NOTE — Progress Notes (Signed)
Subjective:    Patient ID: Katie [name], female    DOB: May 12, 1981, 33 y.o.   MRN: [id number]  Abdominal Pain Associated symptoms include constipation, diarrhea, nausea and vomiting. Pertinent negatives include no dysuria or fever.   Patient seen with acute nausea, vomiting, diarrhea. Symptoms came on fairly acutely around 12 noon today. She's had multiple similar episodes in the past. She states around age 33 she had about 2 months of intermittent transient nausea, vomiting, and diarrhea. She had similar episode in her early 84s and then starting around January of this year has intermittent episodes as above. These seem to come on unpredictably. No clear triggers. No relation to eating. She's never had any associated fevers or chills. No hematemesis. No relation to specific foods. No appetite or weight changes. previous H Pylori testing negative.  She has history of bipolar disorder and possible fibromyalgia.  on chronic pain medications. Frequently has constipation issues at baseline with the exception of acute episodes above. Her acute episodes of nausea, vomiting, diarrhea usually last a couple of hours. She has some associated periumbilical pain just above the umbilicus. No epigastric pain.  Past Medical History  Diagnosis Date  . Asthma   . Bipolar affective disorder     onogoing Dr Nicholaus Corolla treatment for the past 10 years  . GERD (gastroesophageal reflux disease)   . Allergy   . Migraines     teenage onset with periods  . History of recurrent UTIs     cystoscopy with Dr Barnie Del [facility]  . History of kidney stones     highschool   Past Surgical History  Procedure Laterality Date  . Adenoidectomy      1990  . Knee surgery      left knee torn cartilage removed 1994  . Hemangioma excision      2009 removal from right ar    reports that she has never smoked. She has never used smokeless tobacco. She reports that she does not drink alcohol or use illicit  drugs. family history includes Alcohol abuse in her maternal grandfather, paternal grandfather, and paternal grandmother; Diabetes in her maternal aunt, maternal grandmother, and mother; Hyperlipidemia in her mother; Hypertension in her mother; Lung cancer in her maternal grandmother and paternal grandfather; Lymphoma in her mother; Osteoarthritis in her father and mother; Other in her father; Prostate cancer in her maternal grandfather. Allergies  Allergen Reactions  . Zonisamide Nausea And Vomiting       Review of Systems  Constitutional: Negative for fever, chills, appetite change and unexpected weight change.  Respiratory: Negative for shortness of breath.   Cardiovascular: Negative for chest pain.  Gastrointestinal: Positive for nausea, vomiting, abdominal pain, diarrhea and constipation. Negative for blood in stool.  Endocrine: Negative for polydipsia and polyuria.  Genitourinary: Negative for dysuria.  Neurological: Negative for dizziness.       Objective:   Physical Exam  Constitutional: She appears well-developed and well-nourished. No distress.  HENT:  Mouth/Throat: Oropharynx is clear and moist.  Cardiovascular: Normal rate and regular rhythm.   Pulmonary/Chest: Effort normal and breath sounds normal. No respiratory distress. She has no wheezes. She has no rales.  Abdominal: Soft. Bowel sounds are normal. She exhibits no distension and no mass. There is tenderness. There is no rebound and no guarding.  Mild periumbilical tenderness but no guarding or rebound. No hepatomegaly or splenomegaly.  Skin: No rash noted.          Assessment & Plan:  Patient  presents with intermittent acute episodes of nausea, vomiting, and diarrhea which are very transient- usually lasting about 2 hours. She's had these several times over many years. She does have some associated periumbilical pains today which have eased up somewhat since onset. Zofran 8 mg every 8 hours as needed. Check  labs with CBC, hepatic panel, basic metabolic panel, lipase. Question IBS related. She has not had any specific relation to foods to suggest likely gluten intolerance or lactose intolerance.

## 2014-01-16 NOTE — Progress Notes (Signed)
Pre visit review using our clinic review tool, if applicable. No additional management support is needed unless otherwise documented below in the visit note. 

## 2014-01-16 NOTE — Patient Instructions (Signed)

## 2014-01-17 ENCOUNTER — Telehealth: Payer: Self-pay

## 2014-01-17 LAB — BASIC METABOLIC PANEL
BUN: 16 mg/dL (ref 6–23)
CO2: 24 mEq/L (ref 19–32)
Calcium: 9.8 mg/dL (ref 8.4–10.5)
Chloride: 107 mEq/L (ref 96–112)
Creatinine, Ser: 0.8 mg/dL (ref 0.4–1.2)
GFR: 90.39 mL/min (ref >60.00)
Glucose, Bld: 105 mg/dL — ABNORMAL HIGH (ref 70–99)
POTASSIUM: 4.7 meq/L (ref 3.5–5.1)
SODIUM: 139 meq/L (ref 135–145)

## 2014-01-17 LAB — CBC WITH DIFFERENTIAL/PLATELET
Basophils Absolute: 0.3 10*3/uL — ABNORMAL HIGH (ref 0.0–0.1)
Basophils Relative: 1.5 % (ref 0.0–3.0)
EOS PCT: 0.1 % (ref 0.0–5.0)
Eosinophils Absolute: 0 10*3/uL (ref 0.0–0.7)
HEMATOCRIT: 40.5 % (ref 36.0–46.0)
HEMOGLOBIN: 13.7 g/dL (ref 12.0–15.0)
LYMPHS ABS: 0.2 10*3/uL — AB (ref 0.7–4.0)
Lymphocytes Relative: 1.1 % — ABNORMAL LOW (ref 12.0–46.0)
MCHC: 33.8 g/dL (ref 30.0–36.0)
MCV: 84.5 fl (ref 78.0–100.0)
MONOS PCT: 0.1 % — AB (ref 3.0–12.0)
Monocytes Absolute: 0 10*3/uL — ABNORMAL LOW (ref 0.1–1.0)
NEUTROS ABS: 20.2 10*3/uL — AB (ref 1.4–7.7)
Neutrophils Relative %: 97.2 % — ABNORMAL HIGH (ref 43.0–77.0)
Platelets: 398 10*3/uL (ref 150.0–400.0)
RBC: 4.79 Mil/uL (ref 3.87–5.11)
RDW: 14.7 % (ref 11.5–15.5)

## 2014-01-17 LAB — HEPATIC FUNCTION PANEL
ALBUMIN: 4 g/dL (ref 3.5–5.2)
ALT: 21 U/L (ref 0–35)
AST: 37 U/L (ref 0–37)
Alkaline Phosphatase: 80 U/L (ref 39–117)
BILIRUBIN TOTAL: 0.5 mg/dL (ref 0.2–1.2)
Bilirubin, Direct: 0 mg/dL (ref 0.0–0.3)
Total Protein: 7.8 g/dL (ref 6.0–8.3)

## 2014-01-17 LAB — LIPASE: Lipase: 28 U/L (ref 11.0–59.0)

## 2014-01-17 NOTE — Telephone Encounter (Signed)
Per [facility] WBC is 20.8. Labs will be faxed.

## 2014-01-17 NOTE — Telephone Encounter (Signed)
Called and left message for pt to  Call back.

## 2014-01-27 ENCOUNTER — Ambulatory Visit (INDEPENDENT_AMBULATORY_CARE_PROVIDER_SITE_OTHER): Payer: Managed Care, Other (non HMO) | Admitting: Family Medicine

## 2014-01-27 ENCOUNTER — Encounter: Payer: Self-pay | Admitting: Family Medicine

## 2014-01-27 ENCOUNTER — Telehealth: Payer: Self-pay | Admitting: Internal Medicine

## 2014-01-27 ENCOUNTER — Encounter: Payer: Self-pay | Admitting: Gastroenterology

## 2014-01-27 ENCOUNTER — Telehealth: Payer: Self-pay | Admitting: Family Medicine

## 2014-01-27 ENCOUNTER — Other Ambulatory Visit (HOSPITAL_COMMUNITY)
Admission: RE | Admit: 2014-01-27 | Discharge: 2014-01-27 | Disposition: A | Discharge status: Home / Self Care | Payer: Managed Care, Other (non HMO) | Source: Ambulatory Visit | Attending: Family Medicine | Admitting: Family Medicine

## 2014-01-27 VITALS — BP 98/64 | HR 84 | Temp 98.5°F | Ht 64.0 in | Wt 189.0 lb

## 2014-01-27 DIAGNOSIS — Z113 Encounter for screening for infections with a predominantly sexual mode of transmission: Secondary | ICD-10-CM | POA: Insufficient documentation

## 2014-01-27 DIAGNOSIS — N760 Acute vaginitis: Secondary | ICD-10-CM | POA: Insufficient documentation

## 2014-01-27 DIAGNOSIS — R109 Unspecified abdominal pain: Secondary | ICD-10-CM

## 2014-01-27 DIAGNOSIS — R1115 Cyclical vomiting syndrome unrelated to migraine: Secondary | ICD-10-CM

## 2014-01-27 DIAGNOSIS — R197 Diarrhea, unspecified: Secondary | ICD-10-CM

## 2014-01-27 LAB — CBC WITH DIFFERENTIAL/PLATELET
Basophils Absolute: 0 10*3/uL (ref 0.0–0.1)
Basophils Relative: 0 % (ref 0.0–3.0)
EOS PCT: 1.1 % (ref 0.0–5.0)
Eosinophils Absolute: 0.2 10*3/uL (ref 0.0–0.7)
HCT: 38.3 % (ref 36.0–46.0)
Hemoglobin: 12.8 g/dL (ref 12.0–15.0)
LYMPHS PCT: 8.3 % — AB (ref 12.0–46.0)
Lymphs Abs: 1.3 10*3/uL (ref 0.7–4.0)
MCHC: 33.5 g/dL (ref 30.0–36.0)
MCV: 84.9 fl (ref 78.0–100.0)
MONO ABS: 0.9 10*3/uL (ref 0.1–1.0)
MONOS PCT: 5.5 % (ref 3.0–12.0)
NEUTROS PCT: 85.1 % — AB (ref 43.0–77.0)
Neutro Abs: 13.5 10*3/uL — ABNORMAL HIGH (ref 1.4–7.7)
Platelets: 352 10*3/uL (ref 150.0–400.0)
RBC: 4.51 Mil/uL (ref 3.87–5.11)
RDW: 14.4 % (ref 11.5–15.5)
WBC: 15.8 10*3/uL — AB (ref 4.0–10.5)

## 2014-01-27 LAB — POCT URINALYSIS DIPSTICK
GLUCOSE UA: NEGATIVE
Ketones, UA: NEGATIVE
Nitrite, UA: NEGATIVE
Spec Grav, UA: 1.025
Urobilinogen, UA: 0.2
pH, UA: 6

## 2014-01-27 LAB — COMPREHENSIVE METABOLIC PANEL
ALBUMIN: 3.7 g/dL (ref 3.5–5.2)
ALK PHOS: 92 U/L (ref 39–117)
ALT: 37 U/L — ABNORMAL HIGH (ref 0–35)
AST: 42 U/L — ABNORMAL HIGH (ref 0–37)
BUN: 14 mg/dL (ref 6–23)
CO2: 24 mEq/L (ref 19–32)
Calcium: 9.3 mg/dL (ref 8.4–10.5)
Chloride: 106 mEq/L (ref 96–112)
Creatinine, Ser: 0.7 mg/dL (ref 0.4–1.2)
GFR: 100.73 mL/min (ref >60.00)
GLUCOSE: 98 mg/dL (ref 70–99)
POTASSIUM: 4.3 meq/L (ref 3.5–5.1)
Sodium: 138 mEq/L (ref 135–145)
Total Bilirubin: 0.4 mg/dL (ref 0.2–1.2)
Total Protein: 7 g/dL (ref 6.0–8.3)

## 2014-01-27 LAB — LIPASE: Lipase: 29 U/L (ref 11.0–59.0)

## 2014-01-27 LAB — H. PYLORI ANTIBODY, IGG: H PYLORI IGG: NEGATIVE

## 2014-01-27 NOTE — Patient Instructions (Addendum)
-  We have ordered labs or studies at this visit. It can take up to 1-2 weeks for results and processing. We will contact you with instructions IF your results are abnormal. Normal results will be released to your [facility]. If you have not heard from [place] or can not find your results in [facility] in 2 weeks please contact our office.   --We placed a referral for you as discussed to the gastroenterologist. It usually takes about 1-2 weeks to process and schedule this referral. If you have not heard from [place] regarding this appointment in 2 weeks please contact our office.  -seek care immediately if worsening or new concerns  -follow up with PCP as scheduled

## 2014-01-27 NOTE — Progress Notes (Signed)
Pre visit review using our clinic review tool, if applicable. No additional management support is needed unless otherwise documented below in the visit note. 

## 2014-01-27 NOTE — Telephone Encounter (Signed)
Discussed labs with pt. Query resolving enteritis but discussed other potential and serious etiologies. She is feeling much better today with no abd pain, no nausea or vomiting or diarrhea or fever. I advised imaging if any further symptoms and offered regardless - she prefers to hold off and see gi. I did advised if sypmtoms recur needs evaluation and emergency eval over the weekend if recur.

## 2014-01-27 NOTE — Telephone Encounter (Signed)
Noted  

## 2014-01-27 NOTE — Telephone Encounter (Signed)
Patient Information:  Caller Name: Amiel  Phone: [phone]  Patient: Katie [name]  Gender: Female  DOB: 07-Jun-1981  Age: 33 Years  PCP: Shawna Orleans [facility] [place]) (Adults only)  Pregnant: No  Office Follow Up:  Does the office need to follow up with this patient?: No  Instructions For The Office: N/A   Symptoms  Reason For Call & Symptoms: Pt reports she was seen in the office last week with abd pain, vomiting and diarrhea, afebrile.  Pt reports diagnosed with viral illness.  Pt reports she now has the same symptoms today.  Reviewed Health History In EMR: Yes  Reviewed Medications In EMR: Yes  Reviewed Allergies In EMR: Yes  Reviewed Surgeries / Procedures: Yes  Date of Onset of Symptoms: 01/26/2014 OB / GYN:  LMP: 01/18/2014  Guideline(s) Used:  Abdominal Pain - Female  Abdominal Pain - Upper  Disposition Per Guideline:   Go to ED Now (or to Office with PCP Approval)  Reason For Disposition Reached:   Constant abdominal pain lasting > 2 hours  Advice Given:  Call Back If:  You become worse.  Patient Will Follow Care Advice:  YES  Appointment Scheduled:  01/27/2014 09:00:00 Appointment Scheduled Provider:  Maudie Mercury (TEXT 1st, after 20 mins can call), [facility])

## 2014-01-27 NOTE — Progress Notes (Signed)
No chief complaint on file.   HPI:  Katie [name], is a 33 yo F patient of Dr. Shawna Orleans, with a PMH of bipolar disorder, GERD, chronic pain - on chronic pain meds, migraines and recurrent UTIs here for an acute visit for:  Abd Pain: -started: at age 41 with intermittent episodes of abd pain, nausea, vomiting, diarrhea and constipation - reports comes every few weeks -reports more frequent this year and more severe over the last 6 months -had evaluation with gastroenterologist at age 89 but has not seen GI since -neg h. Pylori in past -saw Dr. Elease Hashimoto 10 days ago for this and CBC showed elevated WBC, normal lipase and normal LFTs but at the time symptoms were improving and resolved completely for 1 week- but recurred this morning -current symptoms: today 4 episodes of watery diarrhea, nausea, 2 episodes of emesis, LUQ abd pain this morning - now resolved -denies: weight loss, fevers, melena, hematochezia, hematemesis, bilious emesis, dysphagia, severe pain, flank pain, vaginal discharge, inability to tolerate fluids -sexually active, monogamous for 13 years  FDLMP: July 7th, normal and regular, just stopped birth control  FH: mother has stomach issues - diagnosed as IBS Chronic fatigue and malaise, chronic skin issues - told nerve related   ROS: See pertinent positives and negatives per HPI.  Past Medical History  Diagnosis Date  . Asthma   . Bipolar affective disorder     onogoing Dr Nicholaus Corolla treatment for the past 10 years  . GERD (gastroesophageal reflux disease)   . Allergy   . Migraines     teenage onset with periods  . History of recurrent UTIs     cystoscopy with Dr Barnie Del [facility]  . History of kidney stones     highschool    Past Surgical History  Procedure Laterality Date  . Adenoidectomy      1990  . Knee surgery      left knee torn cartilage removed 1994  . Hemangioma excision      2009 removal from right ar    Family History  Problem Relation  Age of Onset  . Alcohol abuse Paternal Grandmother   . Alcohol abuse Paternal Grandfather   . Lung cancer Paternal Grandfather   . Alcohol abuse Maternal Grandfather   . Prostate cancer Maternal Grandfather   . Osteoarthritis Mother   . Lymphoma Mother     Non-Hodgkin's lymphoma  . Hyperlipidemia Mother   . Hypertension Mother   . Diabetes Mother     type 2  . Osteoarthritis Father   . Other Father     Depression.anxiety  . Lung cancer Maternal Grandmother   . Diabetes Maternal Grandmother   . Diabetes Maternal Aunt     History   Social History  . Marital Status: Married    Spouse Name: N/A    Number of Children: N/A  . Years of Education: N/A   Occupational History  . preschool teacher and nanny    Social History Main Topics  . Smoking status: Never Smoker   . Smokeless tobacco: Never Used  . Alcohol Use: No  . Drug Use: No  . Sexual Activity: Yes    Birth Control/ Protection: Pill   Other Topics Concern  . None   Social History Narrative   Grew up in [place]   Married 5 yrs   Occupation - prev occupation.  Preschool teacher and nanny   Never smoked.  No etoh no drugs    Current outpatient prescriptions:Cholecalciferol (  VITAMIN D3) 2000 UNITS TABS, Take 1 tablet by mouth daily., Disp: , Rfl: ;  Fluvoxamine Maleate (LUVOX CR) 100 MG CP24, Take 100 mg by mouth 2 (two) times daily. , Disp: 30 each, Rfl: ;  gabapentin (NEURONTIN) 600 MG tablet, TAKE ONE TABLET BY MOUTH THREE TIMES DAILY, Disp: 270 tablet, Rfl: 1 HYDROcodone-acetaminophen (NORCO/VICODIN) 5-325 MG per tablet, Take 1 tablet by mouth every 6 (six) hours as needed for moderate pain., Disp: 90 tablet, Rfl: 0;  omeprazole (PRILOSEC) 40 MG capsule, Take 1 capsule (40 mg total) by mouth daily., Disp: 90 capsule, Rfl: 1;  ondansetron (ZOFRAN ODT) 8 MG disintegrating tablet, Take 1 tablet (8 mg total) by mouth every 8 (eight) hours as needed for nausea or vomiting., Disp: 20 tablet, Rfl: 0 tiZANidine (ZANAFLEX) 2  MG tablet, Take 0.5 tablets (1 mg total) by mouth every 8 (eight) hours as needed., Disp: 45 tablet, Rfl: 2  EXAM:  Filed Vitals:   01/27/14 0911  BP: 98/64  Pulse: 84  Temp: 98.5 F (36.9 C)    Body mass index is 32.43 kg/(m^2).  GENERAL: vitals reviewed and listed above, alert, oriented, appears well hydrated and in no acute distress  HEENT: atraumatic, conjunttiva clear, no obvious abnormalities on inspection of external nose and ears  NECK: no obvious masses on inspection  LUNGS: clear to auscultation bilaterally, no wheezes, rales or rhonchi, good air movement  CV: HRRR, no peripheral edema  ABD: BS+, soft, some difuse mid, epigastric, LLQ mild tenderness on deep palptation, no rebound or guarding, no RLQ pain and neg murphys, no tenderness at deep palptation of mcburney's point,  neg psoas sign, neg obturator sign  SKIN: multiple papules and hyperpigmented scars throughout  MS: moves all extremities without noticeable abnormality  PSYCH: pleasant and cooperative, no obvious depression or anxiety  ASSESSMENT AND PLAN:  Discussed the following assessment and plan:  Abdominal pain, unspecified site - Plan: POC Urinalysis Dipstick, Cervicovaginal ancillary only, CULTURE, URINE COMPREHENSIVE, H. pylori Antibody, IgG, Tissue transglutaminase, IgA, Ambulatory referral to Gastroenterology, CMP, CBC with Differential, Lipase, CANCELED: CMP, CANCELED: CBC with Differential, CANCELED: Lipase  Non-intractable cyclical vomiting with nausea - Plan: H. pylori Antibody, IgG, Tissue transglutaminase, IgA, Ambulatory referral to Gastroenterology, CMP, CBC with Differential, Lipase, CANCELED: CMP, CANCELED: CBC with Differential, CANCELED: Lipase  Diarrhea - Plan: H. pylori Antibody, IgG, Tissue transglutaminase, IgA, Ambulatory referral to Gastroenterology, CMP, CBC with Differential, Lipase, CANCELED: CMP, CANCELED: CBC with Differential, CANCELED: Lipase  -chronic, worsening,  intermittent abd pain, nausea, vomiting, diarrhea, constipation - had episode this morning now resolved with benign exam, had white count with last episode -we discussed possible serious and likely etiologies, workup and treatment, treatment risks and return precautions -after this discussion, Katie [name] opted for STAT repeat basic labs, TTGA, repeat Hpylori given LUQ pain, also did pelvic with full wet prep and gc/chlam though unlikely PID, Ucx pending - dip ok, gi referral, discussed US/CT opted to get labs first then decide since feeling better now -with her skin findings and chronic fatigue celiacs is a possibility. IBD also a consideration -IBS, functional possible - do not explain white count last visit -appendicitis unlikely given exam and LUQ as primary site of pain -of course, we advised Katie [name]  to return or notify a doctor immediately if symptoms worsen or persist or new concerns arise.   -Patient advised to return or notify a doctor/seek emergency care immediately if symptoms worsen or persist or new concerns arise.  Patient Instructions  -We have  ordered labs or studies at this visit. It can take up to 1-2 weeks for results and processing. We will contact you with instructions IF your results are abnormal. Normal results will be released to your Katie [name]. If you have not heard from [place] or can not find your results in [name] in 2 weeks please contact our office.   --We placed a referral for you as discussed to the gastroenterologist. It usually takes about 1-2 weeks to process and schedule this referral. If you have not heard from [place] regarding this appointment in 2 weeks please contact our office.  -seek care immediately if worsening or new concerns          Lynzy Rawles R.

## 2014-01-29 LAB — CULTURE, URINE COMPREHENSIVE
Colony Count: NO GROWTH
ORGANISM ID, BACTERIA: NO GROWTH

## 2014-01-30 LAB — TISSUE TRANSGLUTAMINASE, IGA: Tissue Transglutaminase Ab, IgA: 5.5 U/mL (ref <20)

## 2014-02-01 ENCOUNTER — Ambulatory Visit: Payer: Managed Care, Other (non HMO) | Admitting: Internal Medicine

## 2014-02-06 ENCOUNTER — Ambulatory Visit (INDEPENDENT_AMBULATORY_CARE_PROVIDER_SITE_OTHER): Payer: Managed Care, Other (non HMO) | Admitting: Internal Medicine

## 2014-02-06 ENCOUNTER — Encounter: Payer: Self-pay | Admitting: Internal Medicine

## 2014-02-06 VITALS — BP 124/82 | Temp 98.8°F | Ht 64.0 in | Wt 187.0 lb

## 2014-02-06 DIAGNOSIS — IMO0001 Reserved for inherently not codable concepts without codable children: Secondary | ICD-10-CM

## 2014-02-06 DIAGNOSIS — M797 Fibromyalgia: Secondary | ICD-10-CM

## 2014-02-06 DIAGNOSIS — G9332 Myalgic encephalomyelitis/chronic fatigue syndrome: Secondary | ICD-10-CM

## 2014-02-06 DIAGNOSIS — F319 Bipolar disorder, unspecified: Secondary | ICD-10-CM

## 2014-02-06 DIAGNOSIS — R5382 Chronic fatigue, unspecified: Secondary | ICD-10-CM

## 2014-02-06 DIAGNOSIS — R1013 Epigastric pain: Secondary | ICD-10-CM

## 2014-02-06 DIAGNOSIS — F3170 Bipolar disorder, currently in remission, most recent episode unspecified: Secondary | ICD-10-CM

## 2014-02-06 MED ORDER — FLUVOXAMINE MALEATE ER 100 MG PO CP24: 100.0000 mg | ORAL_CAPSULE | Freq: Two times a day (BID) | ORAL | Status: DC

## 2014-02-06 MED ORDER — HYDROCODONE-ACETAMINOPHEN 5-325 MG PO TABS: 1.0000 | ORAL_TABLET | Freq: Four times a day (QID) | ORAL | Status: DC | PRN

## 2014-02-06 NOTE — Assessment & Plan Note (Signed)
33 year old white female with unexplained nausea vomiting and episodic severe midabdominal pain. When she was seen by Dr. Elease Hashimoto she had significant neutrophilia with left shift.  On exam she has midabdominal tenderness with questionable firm area near mid abdomen. Obtain abdominal ultrasound. Repeat CBC with differential. Obtain LFTs and serum lipase. She has appointment with gastroenterologist-Dr. Fuller Plan.  Defer additional imaging to Dr. Fuller Plan.  Question gallbladder disease, Sphincter of ODI dysfunction, vs other.

## 2014-02-06 NOTE — Progress Notes (Signed)
Pre visit review using our clinic review tool, if applicable. No additional management support is needed unless otherwise documented below in the visit note. 

## 2014-02-06 NOTE — Assessment & Plan Note (Signed)
Improved.  She recently started chinese herbal medication.  She had abdominal symptoms before starting herbs.  Monitor BMET, CBCD, and LFTs periodically.

## 2014-02-06 NOTE — Assessment & Plan Note (Signed)
She has not been able to followup with her psychiatrist. Refilled Luvox.

## 2014-02-06 NOTE — Assessment & Plan Note (Signed)
Improved.  Continue acupuncture therapy.

## 2014-02-06 NOTE — Progress Notes (Signed)
Subjective:    Patient ID: Katie [name], female    DOB: [birth year], 33 y.o.   MRN: [id number]  HPI  33 year old white female with history of chronic fibromyalgia, bipolar disorder and chronic fatigue syndrome for followup. Interval medical history patient seen by Dr. Elease Hashimoto and Dr. Maudie Mercury for episode of severe abdominal pain associated with nausea and vomiting. Patient reports she had similar issues on and off when she was around age 33. She reports workup for unexplained intermittent vomiting. She was relatively asymptomatic during her early adulthood, then approximately 10 years ago she again experienced unexplained episodes of nausea and vomiting. Her most recent episodes different in that she experienced intense epigastric discomfort. She also describes a diffuse burning sensation in her abdomen. However there was also a focal area near her mid abdomen the size of a quarter that was focal point for severe pain.  Her symptoms lasted 3-4 hrs.  She has not had recurrence of her abdominal pain.   Review of Systems  Associated diarrhea.  She denies hematemesis or melena. Patient discontinued using Aleve several months ago. She has been taking her proton pump inhibitor.      Past Medical History  Diagnosis Date  . Asthma   . Bipolar affective disorder     onogoing Dr Nicholaus Corolla treatment for the past 10 years  . GERD (gastroesophageal reflux disease)   . Allergy   . Migraines     teenage onset with periods  . History of recurrent UTIs     cystoscopy with Dr Barnie Del [facility]  . History of kidney stones     highschool    History   Social History  . Marital Status: Married    Spouse Name: N/A    Number of Children: N/A  . Years of Education: N/A   Occupational History  . preschool teacher and nanny    Social History Main Topics  . Smoking status: Never Smoker   . Smokeless tobacco: Never Used  . Alcohol Use: No  . Drug Use: No  . Sexual Activity: Yes    Birth  Control/ Protection: Pill   Other Topics Concern  . Not on file   Social History Narrative   Grew up in [place]   Married 5 yrs   Occupation - prev occupation.  Preschool teacher and nanny   Never smoked.  No etoh no drugs    Past Surgical History  Procedure Laterality Date  . Adenoidectomy      1990  . Knee surgery      left knee torn cartilage removed 1994  . Hemangioma excision      2009 removal from right ar    Family History  Problem Relation Age of Onset  . Alcohol abuse Paternal Grandmother   . Alcohol abuse Paternal Grandfather   . Lung cancer Paternal Grandfather   . Alcohol abuse Maternal Grandfather   . Prostate cancer Maternal Grandfather   . Osteoarthritis Mother   . Lymphoma Mother     Non-Hodgkin's lymphoma  . Hyperlipidemia Mother   . Hypertension Mother   . Diabetes Mother     type 2  . Osteoarthritis Father   . Other Father     Depression.anxiety  . Lung cancer Maternal Grandmother   . Diabetes Maternal Grandmother   . Diabetes Maternal Aunt     Allergies  Allergen Reactions  . Zonisamide Nausea And Vomiting    Current Outpatient Prescriptions on File Prior to Visit  Medication Sig  Dispense Refill  . Cholecalciferol (VITAMIN D3) 2000 UNITS TABS Take 1 tablet by mouth daily.      . Fluvoxamine Maleate (LUVOX CR) 100 MG CP24 Take 100 mg by mouth 2 (two) times daily.   30 each    . gabapentin (NEURONTIN) 600 MG tablet TAKE ONE TABLET BY MOUTH THREE TIMES DAILY  270 tablet  1  . HYDROcodone-acetaminophen (NORCO/VICODIN) 5-325 MG per tablet Take 1 tablet by mouth every 6 (six) hours as needed for moderate pain.  90 tablet  0  . omeprazole (PRILOSEC) 40 MG capsule Take 1 capsule (40 mg total) by mouth daily.  90 capsule  1  . ondansetron (ZOFRAN ODT) 8 MG disintegrating tablet Take 1 tablet (8 mg total) by mouth every 8 (eight) hours as needed for nausea or vomiting.  20 tablet  0  . tiZANidine (ZANAFLEX) 2 MG tablet Take 0.5 tablets (1 mg total) by  mouth every 8 (eight) hours as needed.  45 tablet  2   No current facility-administered medications on file prior to visit.    BP 124/82  Temp(Src) 98.8 F (37.1 C) (Oral)  Ht 5\' 4"  (1.626 m)  Wt 187 lb (84.823 kg)  BMI 32.08 kg/m2    Objective:   Physical Exam  Constitutional: She is oriented to person, place, and time. She appears well-developed and well-nourished. No distress.  HENT:  Head: Normocephalic and atraumatic.  Mouth/Throat: Oropharynx is clear and moist.  Eyes: Conjunctivae and EOM are normal. Pupils are equal, round, and reactive to light.  Neck: Neck supple.  Cardiovascular: Normal rate, regular rhythm and normal heart sounds.   No murmur heard. Pulmonary/Chest: Effort normal and breath sounds normal.  Abdominal: Soft. Bowel sounds are normal.  Epigastric tenderness, slight firmness to mid abdominal area.  Mild RUQ tenderness  Musculoskeletal: She exhibits no edema.  Lymphadenopathy:    She has no cervical adenopathy.  Neurological: She is alert and oriented to person, place, and time. No cranial nerve deficit.  Skin: Skin is warm and dry.  Psychiatric: She has a normal mood and affect. Her behavior is normal.          Assessment & Plan:

## 2014-02-07 LAB — HEPATIC FUNCTION PANEL
ALBUMIN: 4.1 g/dL (ref 3.5–5.2)
ALT: 40 U/L — ABNORMAL HIGH (ref 0–35)
AST: 46 U/L — AB (ref 0–37)
Alkaline Phosphatase: 95 U/L (ref 39–117)
Bilirubin, Direct: 0 mg/dL (ref 0.0–0.3)
TOTAL PROTEIN: 7.5 g/dL (ref 6.0–8.3)
Total Bilirubin: 0.4 mg/dL (ref 0.2–1.2)

## 2014-02-07 LAB — BASIC METABOLIC PANEL
BUN: 13 mg/dL (ref 6–23)
CO2: 29 meq/L (ref 19–32)
Calcium: 10.1 mg/dL (ref 8.4–10.5)
Chloride: 105 mEq/L (ref 96–112)
Creatinine, Ser: 0.8 mg/dL (ref 0.4–1.2)
GFR: 87.76 mL/min (ref >60.00)
Glucose, Bld: 106 mg/dL — ABNORMAL HIGH (ref 70–99)
POTASSIUM: 4.5 meq/L (ref 3.5–5.1)
SODIUM: 139 meq/L (ref 135–145)

## 2014-02-07 LAB — CBC WITH DIFFERENTIAL/PLATELET
BASOS PCT: 0.2 % (ref 0.0–3.0)
Basophils Absolute: 0 10*3/uL (ref 0.0–0.1)
Eosinophils Absolute: 0.1 10*3/uL (ref 0.0–0.7)
Eosinophils Relative: 1.5 % (ref 0.0–5.0)
HEMATOCRIT: 39 % (ref 36.0–46.0)
HEMOGLOBIN: 13.1 g/dL (ref 12.0–15.0)
LYMPHS ABS: 1.9 10*3/uL (ref 0.7–4.0)
Lymphocytes Relative: 24.5 % (ref 12.0–46.0)
MCHC: 33.8 g/dL (ref 30.0–36.0)
MCV: 84 fl (ref 78.0–100.0)
Monocytes Absolute: 0.4 10*3/uL (ref 0.1–1.0)
Monocytes Relative: 4.8 % (ref 3.0–12.0)
NEUTROS ABS: 5.4 10*3/uL (ref 1.4–7.7)
Neutrophils Relative %: 69 % (ref 43.0–77.0)
Platelets: 340 10*3/uL (ref 150.0–400.0)
RBC: 4.64 Mil/uL (ref 3.87–5.11)
RDW: 13.8 % (ref 11.5–15.5)
WBC: 7.8 10*3/uL (ref 4.0–10.5)

## 2014-02-07 LAB — LIPASE: LIPASE: 11 U/L (ref 11.0–59.0)

## 2014-02-10 ENCOUNTER — Ambulatory Visit
Admission: RE | Admit: 2014-02-10 | Discharge: 2014-02-10 | Disposition: A | Discharge status: Home / Self Care | Payer: Managed Care, Other (non HMO) | Source: Ambulatory Visit | Attending: Internal Medicine | Admitting: Internal Medicine

## 2014-02-10 DIAGNOSIS — R1013 Epigastric pain: Secondary | ICD-10-CM

## 2014-03-06 ENCOUNTER — Encounter (INDEPENDENT_AMBULATORY_CARE_PROVIDER_SITE_OTHER): Payer: Self-pay | Admitting: Surgery

## 2014-03-06 ENCOUNTER — Ambulatory Visit (INDEPENDENT_AMBULATORY_CARE_PROVIDER_SITE_OTHER): Payer: Managed Care, Other (non HMO) | Admitting: Surgery

## 2014-03-06 VITALS — BP 140/84 | HR 98 | Temp 99.3°F | Ht 64.0 in | Wt 185.2 lb

## 2014-03-06 DIAGNOSIS — K8010 Calculus of gallbladder with chronic cholecystitis without obstruction: Secondary | ICD-10-CM

## 2014-03-06 NOTE — Progress Notes (Signed)
Patient ID: Katie [name], female   DOB: 04/13/1981, 33 y.o.   MRN: [id number]  Chief Complaint  Patient presents with  . Abdominal Pain    HPI Katie [name] is a 33 y.o. female.  Referred by Dr. Shawna Orleans for evaluation of gallbladder disease  Abdominal Pain Associated symptoms: diarrhea, nausea and vomiting   Associated symptoms: no chest pain, no chills, no constipation, no cough, no fever, no hematuria, no sore throat and no vaginal bleeding    This is a 34 year old female with multiple medical and psychiatric issues who presents with several months of intermittent epigastric abdominal pain with radiation through to her back. This is associated with nausea, vomiting, diarrhea, and abdominal bloating. She has undergone workup including lab work and ultrasound. Ultrasound showed multiple gallstones. Liver function tests were normal. Her symptoms seem to be exacerbated by eating greasy foods. She presents now to discuss cholecystectomy.   Past Medical History  Diagnosis Date  . Asthma   . Bipolar affective disorder     onogoing Dr Nicholaus Corolla treatment for the past 10 years  . GERD (gastroesophageal reflux disease)   . Allergy   . Migraines     teenage onset with periods  . History of recurrent UTIs     cystoscopy with Dr Barnie Del [facility]  . History of kidney stones     highschool    Past Surgical History  Procedure Laterality Date  . Adenoidectomy      1990  . Knee surgery      left knee torn cartilage removed 1994  . Hemangioma excision      2009 removal from right ar    Family History  Problem Relation Age of Onset  . Alcohol abuse Paternal Grandmother   . Alcohol abuse Paternal Grandfather   . Lung cancer Paternal Grandfather   . Alcohol abuse Maternal Grandfather   . Prostate cancer Maternal Grandfather   . Osteoarthritis Mother   . Lymphoma Mother     Non-Hodgkin's lymphoma  . Hyperlipidemia Mother   . Hypertension Mother   . Diabetes Mother     type 2   . Osteoarthritis Father   . Other Father     Depression.anxiety  . Lung cancer Maternal Grandmother   . Diabetes Maternal Grandmother   . Diabetes Maternal Aunt     Social History History  Substance Use Topics  . Smoking status: Never Smoker   . Smokeless tobacco: Never Used  . Alcohol Use: 2.4 oz/week    4 Cans of beer per week    Allergies  Allergen Reactions  . Zonisamide Nausea And Vomiting    Current Outpatient Prescriptions  Medication Sig Dispense Refill  . Prenatal Multivit-Min-Fe-FA (PRENATAL VITAMINS PO) Take by mouth.      . Cholecalciferol (VITAMIN D3) 2000 UNITS TABS Take 1 tablet by mouth daily.      . Fluvoxamine Maleate (LUVOX CR) 100 MG CP24 Take 1 capsule (100 mg total) by mouth 2 (two) times daily.  60 each  3  . gabapentin (NEURONTIN) 600 MG tablet TAKE ONE TABLET BY MOUTH THREE TIMES DAILY  270 tablet  1  . HYDROcodone-acetaminophen (NORCO/VICODIN) 5-325 MG per tablet Take 1 tablet by mouth every 6 (six) hours as needed for moderate pain.  90 tablet  0  . omeprazole (PRILOSEC) 40 MG capsule Take 1 capsule (40 mg total) by mouth daily.  90 capsule  1  . ondansetron (ZOFRAN ODT) 8 MG disintegrating tablet Take 1 tablet (8  mg total) by mouth every 8 (eight) hours as needed for nausea or vomiting.  20 tablet  0  . tiZANidine (ZANAFLEX) 2 MG tablet Take 0.5 tablets (1 mg total) by mouth every 8 (eight) hours as needed.  45 tablet  2   No current facility-administered medications for this visit.    Review of Systems Review of Systems  Constitutional: Negative for fever, chills and unexpected weight change.  HENT: Negative for congestion, hearing loss, sore throat, trouble swallowing and voice change.   Eyes: Negative for visual disturbance.  Respiratory: Negative for cough and wheezing.   Cardiovascular: Negative for chest pain, palpitations and leg swelling.  Gastrointestinal: Positive for nausea, vomiting, abdominal pain, diarrhea and abdominal  distention. Negative for constipation, blood in stool and anal bleeding.  Genitourinary: Negative for hematuria, vaginal bleeding and difficulty urinating.  Musculoskeletal: Negative for arthralgias.  Skin: Negative for rash and wound.  Neurological: Negative for seizures, syncope and headaches.  Hematological: Negative for adenopathy. Does not bruise/bleed easily.  Psychiatric/Behavioral: Negative for confusion.    Blood pressure 140/84, pulse 98, temperature 99.3 F (37.4 C), temperature source Oral, height 5\' 4"  (1.626 m), weight 185 lb 4 oz (84.029 kg).  Physical Exam Physical Exam WDWN in NAD HEENT:  EOMI, sclera anicteric Neck:  No masses, no thyromegaly Lungs:  CTA bilaterally; normal respiratory effort CV:  Regular rate and rhythm; no murmurs Abd:  +bowel sounds, soft, mild epigastric tenderness; no masses Ext:  Well-perfused; no edema Skin:  Warm, dry; no sign of jaundice  Data Reviewed US Abdomen Complete  02/10/2014   CLINICAL DATA:  Intermittent abdominal planning. Epigastric pain. Nausea and vomiting.  EXAM: ULTRASOUND ABDOMEN COMPLETE  COMPARISON:  None.  FINDINGS: Gallbladder:  Multiple gallstones are identified. Largest stone measures 5 mm. No pericholecystic fluid, sonographic Murphy sign or wall thickening.  Common bile duct:  Diameter: Normal at 4 mm.  Liver:  Echogenic when compared to the adjacent RIGHT kidney. No mass or intrahepatic biliary ductal dilation.  IVC:  No abnormality visualized.  Pancreas:  Visualized portion unremarkable.  Spleen:  4.1 cm.  Normal echotexture.  Right Kidney:  Length: 12 cm. Echogenicity within normal limits. No mass or hydronephrosis visualized.  Left Kidney:  Length: 11.8 cm. Echogenicity within normal limits. No mass or hydronephrosis visualized.  Abdominal aorta:  No aneurysm visualized.  Other findings:  None.  IMPRESSION: 1. Cholelithiasis without cholecystitis. 2. Hyperechoic liver compatible with hepatosteatosis.   Electronically  Signed   By: Dereck Ligas M.D.   On: 02/10/2014 09:36    Lab Results  Component Value Date   WBC 7.8 02/06/2014   HGB 13.1 02/06/2014   HCT 39.0 02/06/2014   MCV 84.0 02/06/2014   PLT 340.0 02/06/2014   Lab Results  Component Value Date   ALT 40* 02/06/2014   AST 46* 02/06/2014   ALKPHOS 95 02/06/2014   BILITOT 0.4 02/06/2014   Lab Results  Component Value Date   CREATININE 0.8 02/06/2014   BUN 13 02/06/2014   NA 139 02/06/2014   K 4.5 02/06/2014   CL 105 02/06/2014   CO2 29 02/06/2014     Assessment    Chronic calculus cholecystitis     Plan    Laparoscopic cholecystectomy with intraoperative cholangiogram.  The surgical procedure has been discussed with the patient.  Potential risks, benefits, alternative treatments, and expected outcomes have been explained.  All of the patient's questions at this time have been answered.  The likelihood of reaching the  patient's treatment goal is good.  The patient understand the proposed surgical procedure and wishes to proceed.         Teaira Croft K. 03/06/2014, 2:11 PM

## 2014-03-21 ENCOUNTER — Other Ambulatory Visit: Payer: Self-pay | Admitting: Internal Medicine

## 2014-03-23 ENCOUNTER — Encounter: Payer: Self-pay | Admitting: Internal Medicine

## 2014-03-23 ENCOUNTER — Other Ambulatory Visit: Payer: Self-pay | Admitting: Internal Medicine

## 2014-03-24 ENCOUNTER — Telehealth: Payer: Self-pay | Admitting: Internal Medicine

## 2014-03-24 NOTE — Telephone Encounter (Signed)
Pt needs new rx hydrocodone °

## 2014-03-27 NOTE — Telephone Encounter (Signed)
Pt has 2 pills left.

## 2014-03-28 MED ORDER — HYDROCODONE-ACETAMINOPHEN 5-325 MG PO TABS: 1.0000 | ORAL_TABLET | Freq: Four times a day (QID) | ORAL | Status: DC | PRN

## 2014-03-28 NOTE — Telephone Encounter (Signed)
yes

## 2014-03-28 NOTE — Telephone Encounter (Signed)
Dr Shawna Orleans approved this, can you sign for it?

## 2014-03-29 ENCOUNTER — Other Ambulatory Visit (INDEPENDENT_AMBULATORY_CARE_PROVIDER_SITE_OTHER): Payer: Self-pay | Admitting: Surgery

## 2014-03-29 MED ORDER — HYDROCODONE-ACETAMINOPHEN 5-325 MG PO TABS: 1.0000 | ORAL_TABLET | Freq: Four times a day (QID) | ORAL | Status: DC | PRN

## 2014-03-29 NOTE — Telephone Encounter (Signed)
Left detailed message Rx ready for pickup. Rx printed and signed. 

## 2014-04-05 ENCOUNTER — Ambulatory Visit: Payer: Managed Care, Other (non HMO) | Admitting: Gastroenterology

## 2014-04-10 ENCOUNTER — Encounter: Payer: Self-pay | Admitting: Family Medicine

## 2014-04-10 ENCOUNTER — Ambulatory Visit (INDEPENDENT_AMBULATORY_CARE_PROVIDER_SITE_OTHER): Payer: Managed Care, Other (non HMO) | Admitting: Family Medicine

## 2014-04-10 ENCOUNTER — Ambulatory Visit: Payer: Managed Care, Other (non HMO) | Admitting: Internal Medicine

## 2014-04-10 VITALS — BP 128/72 | HR 89 | Temp 98.3°F | Wt 183.0 lb

## 2014-04-10 DIAGNOSIS — K219 Gastro-esophageal reflux disease without esophagitis: Secondary | ICD-10-CM

## 2014-04-10 DIAGNOSIS — M797 Fibromyalgia: Secondary | ICD-10-CM

## 2014-04-10 DIAGNOSIS — IMO0001 Reserved for inherently not codable concepts without codable children: Secondary | ICD-10-CM

## 2014-04-10 MED ORDER — GABAPENTIN 300 MG PO CAPS: 300.0000 mg | ORAL_CAPSULE | Freq: Three times a day (TID) | ORAL | Status: DC

## 2014-04-10 NOTE — Patient Instructions (Signed)
Consider tapering Prilosec back to 20 mg once daily.

## 2014-04-10 NOTE — Progress Notes (Signed)
Subjective:    Patient ID: Katie [name], female    DOB: [birth year], 33 y.o.   MRN: [id number]  HPI Pt here with husband to discuss possible tapering back on some of her meds with anticipated attempt to get pregnant within the next year.  Had recent surgery for chronic cholecystitis.  Other chronic problems are Bipolar (followed by psychiatry), fibromyalgia, GERD.  She  Has already discussed with psychiatrist who is reluctant to stop her Luvox. Her abdominal pain/nausea have resolved following surgery.  Other medications reviewed.  On Prilosec 40 mg daily for reflux and controlled.  Has not recently tried to scale back. She takes high dose Neurontin 600 mg TId and low dose Zanaflex at night.  Still has some myalgias and arthralgias with medications above.  She has not discussed medication issues with her gyn yet.  Past Medical History  Diagnosis Date  . Asthma   . Bipolar affective disorder     onogoing Dr Katie [name] treatment for the past 10 years  . GERD (gastroesophageal reflux disease)   . Allergy   . Migraines     teenage onset with periods  . History of recurrent UTIs     [facility]oscopy with [name]  . History of kidney stones     highschool   Past Surgical History  Procedure Laterality Date  . Adenoidectomy      1990  . Knee surgery      left knee torn cartilage removed 1994  . Hemangioma excision      2009 removal from right ar    reports that she has never smoked. She has never used smokeless tobacco. She reports that she drinks about 2.4 ounces of alcohol per week. She reports that she does not use illicit drugs. family history includes Alcohol abuse in her maternal grandfather, paternal grandfather, and paternal grandmother; Diabetes in her maternal aunt, maternal grandmother, and mother; Hyperlipidemia in her mother; Hypertension in her mother; Lung cancer in her maternal grandmother and paternal grandfather; Lymphoma in her mother; Osteoarthritis  in her father and mother; Other in her father; Prostate cancer in her maternal grandfather. Allergies  Allergen Reactions  . Zonisamide Nausea And Vomiting      Review of Systems  Constitutional: Negative for fever, chills and appetite change.  Respiratory: Negative for shortness of breath.   Cardiovascular: Negative for chest pain.  Gastrointestinal: Negative for abdominal pain.  Genitourinary: Negative for dysuria.  Musculoskeletal: Positive for back pain.  Neurological: Negative for dizziness and weakness.       Objective:   Physical Exam  Constitutional: She appears well-developed and well-nourished.  Cardiovascular: Normal rate and regular rhythm.   No murmur heard. Pulmonary/Chest: Effort normal and breath sounds normal. No respiratory distress. She has no wheezes. She has no rales.  Musculoskeletal: She exhibits no edema.  She has some typical muscle trigger points typical of fibromyalgia.            Assessment & Plan:  #1) Fibromyalgia. We discussed that essentially all of her chronic meds are pregnancy category C and that we would have to try to weight out risks and benefits for each of these.  She will try leaving off the Zanaflex and tapering back her Neurontin to 300 mg po TID-and discuss possible further tapering in 2-3 weeks at follow up.  We have also advised that she have discussion with her OB regarding her medications. 2)  GERD controlled.  She will try to reduce her Prilosec  to 20 mg daily.  Reassess in 2 weeks.

## 2014-04-10 NOTE — Progress Notes (Signed)
Pre visit review using our clinic review tool, if applicable. No additional management support is needed unless otherwise documented below in the visit note. 

## 2014-05-01 ENCOUNTER — Ambulatory Visit: Payer: Managed Care, Other (non HMO) | Admitting: Family Medicine

## 2014-05-18 ENCOUNTER — Other Ambulatory Visit: Payer: Self-pay | Admitting: Internal Medicine

## 2014-05-19 MED ORDER — HYDROCODONE-ACETAMINOPHEN 5-325 MG PO TABS: 1.0000 | ORAL_TABLET | Freq: Four times a day (QID) | ORAL | Status: DC | PRN

## 2014-05-25 ENCOUNTER — Ambulatory Visit (INDEPENDENT_AMBULATORY_CARE_PROVIDER_SITE_OTHER): Payer: Managed Care, Other (non HMO) | Admitting: Internal Medicine

## 2014-05-25 ENCOUNTER — Encounter: Payer: Self-pay | Admitting: Internal Medicine

## 2014-05-25 VITALS — BP 122/80 | HR 108 | Temp 98.0°F | Ht 64.0 in | Wt 178.0 lb

## 2014-05-25 DIAGNOSIS — F3170 Bipolar disorder, currently in remission, most recent episode unspecified: Secondary | ICD-10-CM

## 2014-05-25 DIAGNOSIS — K219 Gastro-esophageal reflux disease without esophagitis: Secondary | ICD-10-CM

## 2014-05-25 DIAGNOSIS — K8010 Calculus of gallbladder with chronic cholecystitis without obstruction: Secondary | ICD-10-CM

## 2014-05-25 MED ORDER — RANITIDINE HCL 150 MG PO TABS: 150.0000 mg | ORAL_TABLET | Freq: Two times a day (BID) | ORAL | Status: DC

## 2014-05-25 NOTE — Patient Instructions (Addendum)
Taper off omeprazole as directed. Follow antireflux measures

## 2014-05-25 NOTE — Assessment & Plan Note (Signed)
Improved after cholecystectomy.

## 2014-05-25 NOTE — Assessment & Plan Note (Signed)
Patient trying to get pregnant. I suggest she transition from omeprazole to ranitidine. Handout on antireflux measures provided. Weight loss encouraged.

## 2014-05-25 NOTE — Progress Notes (Signed)
Pre visit review using our clinic review tool, if applicable. No additional management support is needed unless otherwise documented below in the visit note. 

## 2014-05-25 NOTE — Assessment & Plan Note (Signed)
Followed by her psychiatrist. Her obstetrician recommends continuing same dose of Luvox.

## 2014-05-25 NOTE — Progress Notes (Signed)
Subjective:    Patient ID: Katie [name], female    DOB: Sep 03, 1980, 33 y.o.   MRN: [id number]  HPI  33 year old white female with history of chronic fibromyalgia, chronic fatigue syndrome and mood disorder for follow-up. Interval medical history-patient underwent cholecystectomy. Patient reports her abdominal symptoms significantly improved.  She is established with an obstetrician. Patient trying to get pregnant.  Patient worried that her fibromyalgia and chronic fatigue syndrome may worsen with pregnancy.  Mood disorder-improved with counseling. Her obstetrician would like for her to stay on same dose of Luvox. He also recommends minimizing use of Vicodin if possible.  She is on omeprazole for chronic GERD.  Review of Systems No abdominal pain    Past Medical History  Diagnosis Date  . Asthma   . Bipolar affective disorder     onogoing Dr Nicholaus Corolla treatment for the past 10 years  . GERD (gastroesophageal reflux disease)   . Allergy   . Migraines     teenage onset with periods  . History of recurrent UTIs     [facility]oscopy with [facility]  . History of kidney stones     highschool    History   Social History  . Marital Status: Married    Spouse Name: N/A    Number of Children: N/A  . Years of Education: N/A   Occupational History  . preschool teacher and nanny    Social History Main Topics  . Smoking status: Never Smoker   . Smokeless tobacco: Never Used  . Alcohol Use: 2.4 oz/week    4 Cans of beer per week  . Drug Use: No  . Sexual Activity: Yes    Birth Control/ Protection: Pill   Other Topics Concern  . Not on file   Social History Narrative   Grew up in [place]   Married 5 yrs   Occupation - prev occupation.  Preschool teacher and nanny   Never smoked.  No etoh no drugs    Past Surgical History  Procedure Laterality Date  . Adenoidectomy      1990  . Knee surgery      left knee torn cartilage removed 1994  . Hemangioma  excision      2009 removal from right ar    Family History  Problem Relation Age of Onset  . Alcohol abuse Paternal Grandmother   . Alcohol abuse Paternal Grandfather   . Lung cancer Paternal Grandfather   . Alcohol abuse Maternal Grandfather   . Prostate cancer Maternal Grandfather   . Osteoarthritis Mother   . Lymphoma Mother     Non-Hodgkin's lymphoma  . Hyperlipidemia Mother   . Hypertension Mother   . Diabetes Mother     type 2  . Osteoarthritis Father   . Other Father     Depression.anxiety  . Lung cancer Maternal Grandmother   . Diabetes Maternal Grandmother   . Diabetes Maternal Aunt     Allergies  Allergen Reactions  . Zonisamide Nausea And Vomiting    Current Outpatient Prescriptions on File Prior to Visit  Medication Sig Dispense Refill  . Fluvoxamine Maleate (LUVOX CR) 100 MG CP24 Take 1 capsule (100 mg total) by mouth 2 (two) times daily. 60 each 3  . HYDROcodone-acetaminophen (NORCO/VICODIN) 5-325 MG per tablet Take 1 tablet by mouth every 6 (six) hours as needed for moderate pain. 90 tablet 0   No current facility-administered medications on file prior to visit.    BP 122/80 mmHg  Pulse 108  Temp(Src) 98 F (36.7 C) (Oral)  Ht 5\' 4"  (1.626 m)  Wt 178 lb (80.74 kg)  BMI 30.54 kg/m2    Objective:   Physical Exam  Constitutional: She is oriented to person, place, and time. She appears well-developed and well-nourished.  Cardiovascular: Normal rate, regular rhythm and normal heart sounds.   No murmur heard. Pulmonary/Chest: Effort normal and breath sounds normal. She has no wheezes.  Musculoskeletal: She exhibits no edema.  Neurological: She is alert and oriented to person, place, and time. No cranial nerve deficit.  Psychiatric: She has a normal mood and affect. Her behavior is normal.          Assessment & Plan:

## 2014-05-26 ENCOUNTER — Ambulatory Visit: Payer: Managed Care, Other (non HMO) | Admitting: Internal Medicine

## 2014-05-30 ENCOUNTER — Ambulatory Visit: Payer: Self-pay | Admitting: Internal Medicine

## 2014-06-09 ENCOUNTER — Encounter: Payer: Self-pay | Admitting: Internal Medicine

## 2014-06-27 ENCOUNTER — Other Ambulatory Visit: Payer: Self-pay | Admitting: Internal Medicine

## 2014-07-02 ENCOUNTER — Encounter: Payer: Self-pay | Admitting: Internal Medicine

## 2014-07-02 ENCOUNTER — Telehealth: Payer: Self-pay | Admitting: Internal Medicine

## 2014-07-03 NOTE — Telephone Encounter (Signed)
Patient called to check status of the below re-fill request.

## 2014-07-04 MED ORDER — HYDROCODONE-ACETAMINOPHEN 5-325 MG PO TABS: 1.0000 | ORAL_TABLET | Freq: Four times a day (QID) | ORAL | Status: DC | PRN

## 2014-07-04 NOTE — Telephone Encounter (Signed)
Ria Comment, Dr Shawna Orleans is wanting Dr Tamala Julian to see pt for her back issues.  Can you let me know a good time that he can see her and I can call her.  Thank you!

## 2014-07-04 NOTE — Telephone Encounter (Signed)
Called pt. Ive got her scheduled for 07/18/14 @ 345pm.

## 2014-07-04 NOTE — Telephone Encounter (Signed)
Ok to RF x 2

## 2014-07-18 ENCOUNTER — Encounter: Payer: Self-pay | Admitting: Family Medicine

## 2014-07-18 ENCOUNTER — Ambulatory Visit (INDEPENDENT_AMBULATORY_CARE_PROVIDER_SITE_OTHER): Payer: Managed Care, Other (non HMO) | Admitting: Family Medicine

## 2014-07-18 VITALS — BP 122/78 | HR 107 | Ht 64.0 in | Wt 182.0 lb

## 2014-07-18 DIAGNOSIS — M9902 Segmental and somatic dysfunction of thoracic region: Secondary | ICD-10-CM

## 2014-07-18 DIAGNOSIS — G8929 Other chronic pain: Secondary | ICD-10-CM | POA: Insufficient documentation

## 2014-07-18 DIAGNOSIS — M545 Low back pain, unspecified: Secondary | ICD-10-CM

## 2014-07-18 DIAGNOSIS — M999 Biomechanical lesion, unspecified: Secondary | ICD-10-CM | POA: Insufficient documentation

## 2014-07-18 DIAGNOSIS — M9904 Segmental and somatic dysfunction of sacral region: Secondary | ICD-10-CM

## 2014-07-18 DIAGNOSIS — M9903 Segmental and somatic dysfunction of lumbar region: Secondary | ICD-10-CM

## 2014-07-18 NOTE — Progress Notes (Signed)
  [name] [address] [place], [place] [address] Phone: [phone] Subjective:    I'm seeing this patient by the request  of:  Drema Pry, DO   CC: Chronic low back pain  [id number] Katie [name] is a 34 y.o. female coming in with complaint of chronic low back pain. Patient has had this for years. Patient has been taking Vicodin on a regular basis but patient is pregnant recently. Patient is 5-[redacted] weeks pregnant. Patient states that she has more of a dull throbbing aching pain in her back with no significant radiation or weakness down to the lower extremity. Patient denies any bowel or bladder incontinence. Patient states though that can be so severe that stops her from activities. Patient is not working. Patient states though that sitting or standing for long amount of time can be worse. Patient states that the pain is worse in the morning. Patient is a severity of 8 out of 10. Patient states it does not seem to wake her up at night. Reviewing patient's previous imaging was 2012 of the lumbar spine. This was reviewed by me and this was unremarkable.    Past medical history, social, surgical and family history all reviewed in electronic medical record.   Review of Systems: No headache, visual changes, nausea, vomiting, diarrhea, constipation, dizziness, abdominal pain, skin rash, fevers, chills, night sweats, weight loss, swollen lymph nodes, body aches, joint swelling, muscle aches, chest pain, shortness of breath, mood changes.   Objective Blood pressure 122/78, pulse 107, height 5\' 4"  (1.626 m), weight 182 lb (82.555 kg), SpO2 96 %.  General: No apparent distress alert and oriented x3 mood and affect normal, dressed appropriately.  HEENT: Pupils equal, extraocular movements intact  Respiratory: Patient's speak in full sentences and does not appear short of breath  Cardiovascular: No lower extremity edema, non tender, no erythema  Skin: Warm dry  intact with no signs of infection or rash on extremities or on axial skeleton.  Abdomen: Soft nontender  Neuro: Cranial nerves II through XII are intact, neurovascularly intact in all extremities with 2+ DTRs and 2+ pulses.  Lymph: No lymphadenopathy of posterior or anterior cervical chain or axillae bilaterally.  Gait normal with good balance and coordination.  MSK:  Non tender with full range of motion and good stability and symmetric strength and tone of shoulders, elbows, wrist, hip, knee and ankles bilaterally.  Back Exam:  Inspection: Mild decrease in lordosis Motion: Flexion 45 deg, Extension 45 deg, Side Bending to 45 deg bilaterally,  Rotation to 45 deg bilaterally  SLR laying: Negative  XSLR laying: Negative  Palpable tenderness: Tenderness in the paraspinal musculature of the lumbar spine bilaterally (right FABER: negative. Sensory change: Gross sensation intact to all lumbar and sacral dermatomes.  Reflexes: 2+ at both patellar tendons, 2+ at achilles tendons, Babinski's downgoing.  Strength at foot  Plantar-flexion: 5/5 Dorsi-flexion: 5/5 Eversion: 5/5 Inversion: 5/5  Leg strength  Quad: 5/5 Hamstring: 5/5 Hip flexor: 5/5 Hip abductors: 4/5  Gait unremarkable.  Osteopathic findings Cervical C4 flexed rotated and side bent right  Thoracic T3 extended rotated and side bent right T7 extended rotated and side bent left  Lumbar L2 flexed rotated and side bent right  Sacrum Right on right    Impression and Recommendations:     This case required medical decision making of moderate complexity.

## 2014-07-18 NOTE — Assessment & Plan Note (Signed)
Discussed with patient at great length. Patient having other comorbidities including chronic fatigue fibromyalgia syndrome as well as bipolar disorder these could be also contribute in. Patient does have poor core strength as well. Patient did respond fairly well to osteopathic manipulation we discussed with patient being pregnant the concerns of any direct type procedures. Patient did respond well to counterstrain and soft tissue. Patient verbalized understanding and gave agreement to allow for osteopathic manipulation today. Patient did have decrease in pain immediately. We discussed sleeping position, postural exercises, and given a home exercise program. We discussed over-the-counter medicine only been Tylenol that is helpful and discussed decreasing patient's Vicodin. Patient will try to make these changes and come back and see me again in 3-4 weeks for further evaluation and treatment.

## 2014-07-18 NOTE — Patient Instructions (Signed)
Nice to meet you and congrats!!!! Ice 20 minutes 2 times daily. Usually after activity and before bed. Exercises 3 times a week.  Tylenol up to 3 times daily 325mg  would be safe during pregnancy.  Try to cut back on vicodin.   Water 10-12 cups daily Tennis ball between shoulder blades with sitting On wall heels, butt shoulder and head touching goal of 5 minutes daily See me again in 3-4 weeks.

## 2014-07-18 NOTE — Assessment & Plan Note (Signed)
Decision today to treat with OMT was based on Physical Exam  After verbal consent patient was treated with Soft tissue and counterstrain  techniques in cervical, thoracic and lumbar and sacral areas  Patient tolerated the procedure well with improvement in symptoms  Patient given exercises, stretches and lifestyle modifications  See medications in patient instructions if given  Patient will follow up in 3 weeks

## 2014-07-19 ENCOUNTER — Encounter: Payer: Self-pay | Admitting: Internal Medicine

## 2014-07-20 ENCOUNTER — Telehealth: Payer: Self-pay | Admitting: Internal Medicine

## 2014-07-20 NOTE — Telephone Encounter (Signed)
Patient Name: Katie [name]  DOB: [birth year]    Nurse Assessment  Nurse: Vallery Sa, RN, Cathy Date/Time (Eastern Time): 07/20/2014 8:39:39 AM  Confirm and document reason for call. If symptomatic, describe symptoms. ---Caller states she developed back and chest pain about 2 days ago that is coming and going. No severe breathing difficulty. No injury in the past 3 days. No fever. No vaginal bleeding/drainage or signs of labor.  Has the patient traveled out of the country within the last 30 days? ---No  Does the patient require triage? ---Yes  Related visit to physician within the last 2 weeks? ---No  Does the PT have any chronic conditions? (i.e. diabetes, asthma, etc.) ---Yes  List chronic conditions. ---Gallbladder removed  Did the patient indicate they were pregnant? ---Yes  How many weeks gestation? ---9  Have you felt decreased fetal movement? ---Early Pregnancy - No Fetal Movement Felt Yet     Guidelines    Guideline Title Affirmed Question Affirmed Notes  Chest Pain [1] Intermittent chest pain or "angina" AND [2] increasing in severity or frequency (Exception: pains lasting a few seconds)    Final Disposition User   Go to ED Now Vallery Sa, RN, [place]    Comments  Katie [name] declined the Go to ER disposition. Reinforced the Go to ER disposition. She plans to wait and see a MD next Monday. Called the office backline and Kenney Houseman asks that we fax the report to the office and she will have MD or Office Nurse call her back. Casady updated.

## 2014-07-20 NOTE — Telephone Encounter (Signed)
Dr. Hunter please advise. 

## 2014-07-20 NOTE — Telephone Encounter (Signed)
Offer 4:15 appointment. Complete EKG as soon as she arrives if she takes this slot.

## 2014-07-20 NOTE — Telephone Encounter (Signed)
Pt is scheduled for 9:45 mon. Ok per Dr. Yong Channel

## 2014-07-24 ENCOUNTER — Encounter: Payer: Self-pay | Admitting: Family Medicine

## 2014-07-24 ENCOUNTER — Ambulatory Visit (INDEPENDENT_AMBULATORY_CARE_PROVIDER_SITE_OTHER): Payer: Managed Care, Other (non HMO) | Admitting: Family Medicine

## 2014-07-24 VITALS — BP 136/88 | HR 95 | Temp 98.8°F | Ht 64.0 in | Wt 184.0 lb

## 2014-07-24 DIAGNOSIS — M549 Dorsalgia, unspecified: Secondary | ICD-10-CM

## 2014-07-24 DIAGNOSIS — M546 Pain in thoracic spine: Secondary | ICD-10-CM

## 2014-07-24 DIAGNOSIS — R1013 Epigastric pain: Secondary | ICD-10-CM

## 2014-07-24 LAB — CBC WITH DIFFERENTIAL/PLATELET
BASOS ABS: 0 10*3/uL (ref 0.0–0.1)
BASOS PCT: 0.4 % (ref 0.0–3.0)
Eosinophils Absolute: 0.1 10*3/uL (ref 0.0–0.7)
Eosinophils Relative: 0.9 % (ref 0.0–5.0)
HEMATOCRIT: 36.6 % (ref 36.0–46.0)
Hemoglobin: 12.1 g/dL (ref 12.0–15.0)
Lymphocytes Relative: 26.1 % (ref 12.0–46.0)
Lymphs Abs: 2.4 10*3/uL (ref 0.7–4.0)
MCHC: 33.2 g/dL (ref 30.0–36.0)
MCV: 85.6 fl (ref 78.0–100.0)
MONO ABS: 0.7 10*3/uL (ref 0.1–1.0)
MONOS PCT: 7.3 % (ref 3.0–12.0)
NEUTROS PCT: 65.3 % (ref 43.0–77.0)
Neutro Abs: 6 10*3/uL (ref 1.4–7.7)
Platelets: 385 10*3/uL (ref 150.0–400.0)
RBC: 4.27 Mil/uL (ref 3.87–5.11)
RDW: 14.3 % (ref 11.5–15.5)
WBC: 9.2 10*3/uL (ref 4.0–10.5)

## 2014-07-24 LAB — COMPREHENSIVE METABOLIC PANEL
ALK PHOS: 104 U/L (ref 39–117)
ALT: 16 U/L (ref 0–35)
AST: 18 U/L (ref 0–37)
Albumin: 3.8 g/dL (ref 3.5–5.2)
BILIRUBIN TOTAL: 0.1 mg/dL — AB (ref 0.2–1.2)
BUN: 7 mg/dL (ref 6–23)
CO2: 25 meq/L (ref 19–32)
Calcium: 10.5 mg/dL (ref 8.4–10.5)
Chloride: 104 mEq/L (ref 96–112)
Creatinine, Ser: 0.6 mg/dL (ref 0.4–1.2)
GFR: 126.84 mL/min (ref >60.00)
Glucose, Bld: 74 mg/dL (ref 70–99)
POTASSIUM: 4 meq/L (ref 3.5–5.1)
Sodium: 136 mEq/L (ref 135–145)
Total Protein: 7.4 g/dL (ref 6.0–8.3)

## 2014-07-24 LAB — OB RESULTS CONSOLE RPR: RPR: NONREACTIVE

## 2014-07-24 LAB — OB RESULTS CONSOLE RUBELLA ANTIBODY, IGM: RUBELLA: IMMUNE

## 2014-07-24 LAB — OB RESULTS CONSOLE GC/CHLAMYDIA
Chlamydia: NEGATIVE
Gonorrhea: NEGATIVE

## 2014-07-24 LAB — OB RESULTS CONSOLE HIV ANTIBODY (ROUTINE TESTING): HIV: NONREACTIVE

## 2014-07-24 LAB — OB RESULTS CONSOLE HEPATITIS B SURFACE ANTIGEN: HEP B S AG: NEGATIVE

## 2014-07-24 LAB — OB RESULTS CONSOLE ANTIBODY SCREEN: Antibody Screen: NEGATIVE

## 2014-07-24 LAB — OB RESULTS CONSOLE ABO/RH: RH TYPE: POSITIVE

## 2014-07-24 LAB — LIPASE: Lipase: 30 U/L (ref 11.0–59.0)

## 2014-07-24 NOTE — Progress Notes (Signed)
Garret Reddish, MD Phone: [phone]  Subjective:   Katie STRENG is a 34 y.o. year old very pleasant female patient who presents with the following:  Back pain radiating to chest/epigastric area Episodes of back pain radiating to chest. Gallbladder out in [place] is similar to when she still had gallbladder. About once a month since gallbladder surgery. Last week 2 on tuesday, 2 on Wednesday, 1 on Sunday, and 1 in middle of night. Episodes last 5-10 minutes. Burning pressure is how patient describes it. Has not been produced by exertion or relieved by rest. 8/10 pain.   Feels like if she can straighten her back it may feel better but it does not. Tried ice pack. Neither helps. Tried tums during episode with no relief. Similar duration of pain whether she takes Tums or not. Can happen any time of the day. Not clearly associated with food intake. She used to be on omeprazole but this was later changed to ranitidine when she was desiring to get pregnant. Unclear why this was stopped.   Worried could have gallstone in a bile duct. It would be reassuring to her to know cause of pain as she wants to make sure it is not something that will hurt her or her child. Vicodin for ongoing chronic pain-2 a day max, PNV, luvox for depression. Under care of ob/gyn and will be seen later today.   ROS-no abdominal pain, contractions, vaginal bleeding. No fever chills. Does have history of "charlie horse that does not go away" in calves but no recent changes in this or increased frequency and present for at least a year. No swelling in legs. Mild shortness of breath with episodes-primarily has hard time taking deep breath due to pain so not clear if true shortness. Mild nausea when pain most intense. No vomiting.   Past Medical History-chronic pain/fibromyalgia on vicodin, history of epigastric abdominal pain which improved after cholecystectomy, bipolar/depression on luvox  Medications- reviewed and  updated Current Outpatient Prescriptions  Medication Sig Dispense Refill  . Fluvoxamine Maleate (LUVOX CR) 100 MG CP24 Take 1 capsule (100 mg total) by mouth 2 (two) times daily. 60 each 3  . HYDROcodone-acetaminophen (NORCO/VICODIN) 5-325 MG per tablet Take 1 tablet by mouth every 6 (six) hours as needed for moderate pain. 90 tablet 0  . Prenat w/o [id number] (CITRANATAL ASSURE) 35-1 & 300 MG tablet Take 1 tablet by mouth 2 (two) times daily.     No current facility-administered medications for this visit.    Objective: BP 136/88 mmHg  Pulse 95  Temp(Src) 98.8 F (37.1 C) (Oral)  Ht 5\' 4"  (1.626 m)  Wt 184 lb (83.462 kg)  BMI 31.57 kg/m2  SpO2 98% Gen: NAD, resting comfortably on table CV: RRR no murmurs rubs or gallops Lungs: CTAB no crackles, wheeze, rhonchi MSK: palpated upper back and also chest without reproduction of pain. Normal ROM at neck.  Abdomen: soft/tender in epigastric area and RUQ/nondistended/normal bowel sounds. No rebound or guarding.  Ext: no edema, no calf tenderness, no unilateral swelling Skin: warm, dry, no rash, scars noted on abdomen from prior cholecystectomy.   Results for orders placed or performed in visit on 07/24/14 (from the past 24 hour(s))  CBC with Differential     Status: None   Collection Time: 07/24/14 10:45 AM  Result Value Ref Range   WBC 9.2 4.0 - 10.5 K/uL   RBC 4.27 3.87 - 5.11 Mil/uL   Hemoglobin 12.1 12.0 - 15.0 g/dL   HCT 36.6 36.0 -  46.0 %   MCV 85.6 78.0 - 100.0 fl   MCHC 33.2 30.0 - 36.0 g/dL   RDW 14.3 11.5 - 15.5 %   Platelets 385.0 150.0 - 400.0 K/uL   Neutrophils Relative % 65.3 43.0 - 77.0 %   Lymphocytes Relative 26.1 12.0 - 46.0 %   Monocytes Relative 7.3 3.0 - 12.0 %   Eosinophils Relative 0.9 0.0 - 5.0 %   Basophils Relative 0.4 0.0 - 3.0 %   Neutro Abs 6.0 1.4 - 7.7 K/uL   Lymphs Abs 2.4 0.7 - 4.0 K/uL   Monocytes Absolute 0.7 0.1 - 1.0 K/uL   Eosinophils Absolute 0.1 0.0 - 0.7 K/uL   Basophils  Absolute 0.0 0.0 - 0.1 K/uL  Comprehensive metabolic panel     Status: Abnormal   Collection Time: 07/24/14 10:45 AM  Result Value Ref Range   Sodium 136 135 - 145 mEq/L   Potassium 4.0 3.5 - 5.1 mEq/L   Chloride 104 96 - 112 mEq/L   CO2 25 19 - 32 mEq/L   Glucose, Bld 74 70 - 99 mg/dL   BUN 7 6 - 23 mg/dL   Creatinine, Ser 0.6 0.4 - 1.2 mg/dL   Total Bilirubin 0.1 (L) 0.2 - 1.2 mg/dL   Alkaline Phosphatase 104 39 - 117 U/L   AST 18 0 - 37 U/L   ALT 16 0 - 35 U/L   Total Protein 7.4 6.0 - 8.3 g/dL   Albumin 3.8 3.5 - 5.2 g/dL   Calcium 10.5 8.4 - 10.5 mg/dL   GFR 126.84 >60.00 mL/min  Lipase     Status: None   Collection Time: 07/24/14 10:45 AM  Result Value Ref Range   Lipase 30.0 11.0 - 59.0 U/L    Assessment/Plan:  Back pain radiating to epigastric or low chest area Some anxiety related to current pain and increased frequency. Would be reassuring for patient to know that there are no remaining gallstones in bile duct so have ordered abdominal ultrasound. Very low probability this is cardiac. Also doubt DVT/PE. Possibly MSK but would be odd to have intense spasm for under 10 minutes then no lingering pain. Does not have typical reflux features but this is high in differential especially as I reviewed chart after visit and discovered as late as November was on ranitidine and it is possible its d/c corresponds to worsening pain. Pain was not relieved by Tums though. Lab workup with CBC, CMET, lipase unremarkable. If ultrasound reassuring, would likely recommend restarting ranitidine. No pain medication recommended as pain usually resolves within 10 minutes and already on vicodin for chronic pain.    Return precautions advised including increasing frequency or intensity, fever, vomiting. Recommended patient see Dr. Shawna Orleans Friday of this week-Wednesday of next week or can follow up with me.   Orders Placed This Encounter  Procedures  . US Abdomen Complete    180LBS/NO  NEEDS/INS/CIGNA/HB/PT W/EPIC    Standing Status: Future     Number of Occurrences:      Standing Expiration Date: 09/22/2015    Order Specific Question:  Reason for Exam (SYMPTOM  OR DIAGNOSIS REQUIRED)    Answer:  focus RUQ/epigastric area of pain. history cholecystectomy    Order Specific Question:  Preferred imaging location?    Answer:  [facility]  . CBC with Differential  . Comprehensive metabolic panel    [place]  . Lipase

## 2014-07-24 NOTE — Progress Notes (Signed)
Pre visit review using our clinic review tool, if applicable. No additional management support is needed unless otherwise documented below in the visit note. 

## 2014-07-24 NOTE — Patient Instructions (Addendum)
Back and abdominal pain. I do not think this is related to your heart given no pain with exertion and not relieved by rest.   Get ultrasound related to your concern of remaining gallstone though I think this is unlikely.   Check basic labs.   Etiology is unclear at this time but doubt serious etiology such as heart, blood clot, lung.   See your OB today. Initial blood pressure was up a hair and they will monitor this regularly.   Can use Tums though no improvement with this in the past and does not sound classic for reflux

## 2014-07-28 ENCOUNTER — Ambulatory Visit
Admission: RE | Admit: 2014-07-28 | Discharge: 2014-07-28 | Disposition: A | Discharge status: Home / Self Care | Payer: Managed Care, Other (non HMO) | Source: Ambulatory Visit | Attending: Family Medicine | Admitting: Family Medicine

## 2014-07-28 DIAGNOSIS — R1013 Epigastric pain: Secondary | ICD-10-CM

## 2014-08-04 ENCOUNTER — Ambulatory Visit: Payer: Self-pay | Admitting: Internal Medicine

## 2014-08-08 ENCOUNTER — Ambulatory Visit (INDEPENDENT_AMBULATORY_CARE_PROVIDER_SITE_OTHER): Payer: Managed Care, Other (non HMO) | Admitting: Family Medicine

## 2014-08-08 ENCOUNTER — Encounter: Payer: Self-pay | Admitting: Family Medicine

## 2014-08-08 VITALS — BP 114/76 | HR 90 | Ht 64.0 in | Wt 180.0 lb

## 2014-08-08 DIAGNOSIS — M9902 Segmental and somatic dysfunction of thoracic region: Secondary | ICD-10-CM

## 2014-08-08 DIAGNOSIS — M9903 Segmental and somatic dysfunction of lumbar region: Secondary | ICD-10-CM

## 2014-08-08 DIAGNOSIS — M999 Biomechanical lesion, unspecified: Secondary | ICD-10-CM

## 2014-08-08 DIAGNOSIS — M9904 Segmental and somatic dysfunction of sacral region: Secondary | ICD-10-CM

## 2014-08-08 DIAGNOSIS — M545 Low back pain, unspecified: Secondary | ICD-10-CM

## 2014-08-08 DIAGNOSIS — G8929 Other chronic pain: Secondary | ICD-10-CM

## 2014-08-08 NOTE — Patient Instructions (Signed)
Good to se eyou Consider standing on wall with heells, butt shoulder and head touching for goal of 5 minutes a day.  COntinue the other exercises Tylenol when you need it.  Lets keep with 3-4 week intervals.

## 2014-08-08 NOTE — Progress Notes (Signed)
  [facility] [address] [place], [place] [address] Phone: [phone] Subjective:     CC: Chronic low back pain follow up  BJY:[id number] Katie [name] is a 34 y.o. female coming in with complaint of chronic low back pain.  Patient was seen previously and did have more of a chronic low back pain. Patient is pregnant now approximately [redacted] weeks gestation. Patient states that overall her back does feel better. Patient been trying to do the exercises fairly regularly. Has not needed as much Vicodin and she was taking previously. Overall with states that she is a proximally 60% better. No new symptoms. Reviewing patient's chart she did have a workup for morbid thoracic back pain including an abdominal ultrasound that was fairly unremarkable. Patient states that this pain has improved.   Reviewing patient's previous imaging was 2012 of the lumbar spine. This was reviewed by me and this was unremarkable.    Past medical history, social, surgical and family history all reviewed in electronic medical record.   Review of Systems: No headache, visual changes, nausea, vomiting, diarrhea, constipation, dizziness, abdominal pain, skin rash, fevers, chills, night sweats, weight loss, swollen lymph nodes, body aches, joint swelling, muscle aches, chest pain, shortness of breath, mood changes.   Objective Blood pressure 114/76, pulse 90, height 5\' 4"  (1.626 m), weight 180 lb (81.647 kg), SpO2 97 %.  General: No apparent distress alert and oriented x3 mood and affect normal, dressed appropriately.  HEENT: Pupils equal, extraocular movements intact  Respiratory: Patient's speak in full sentences and does not appear short of breath  Cardiovascular: No lower extremity edema, non tender, no erythema  Skin: Warm dry intact with no signs of infection or rash on extremities or on axial skeleton.  Abdomen: Soft nontender  Neuro: Cranial nerves II through XII are intact,  neurovascularly intact in all extremities with 2+ DTRs and 2+ pulses.  Lymph: No lymphadenopathy of posterior or anterior cervical chain or axillae bilaterally.  Gait normal with good balance and coordination.  MSK:  Non tender with full range of motion and good stability and symmetric strength and tone of shoulders, elbows, wrist, hip, knee and ankles bilaterally.  Back Exam:  Inspection: Mild decrease in lordosis Motion: Flexion 45 deg, Extension 45 deg, Side Bending to 45 deg bilaterally,  Rotation to 45 deg bilaterally  SLR laying: Negative  XSLR laying: Negative  Palpable tenderness: Continued mild paraspinal musculature tenderness of the lumbar spine bilaterally FABER: negative. Sensory change: Gross sensation intact to all lumbar and sacral dermatomes.  Reflexes: 2+ at both patellar tendons, 2+ at achilles tendons, Babinski's downgoing.  Strength at foot  Plantar-flexion: 5/5 Dorsi-flexion: 5/5 Eversion: 5/5 Inversion: 5/5  Leg strength  Quad: 5/5 Hamstring: 5/5 Hip flexor: 5/5 Hip abductors: 4/5  Gait unremarkable. Minimal change from previous exam  Osteopathic findings Cervical C4 flexed rotated and side bent right  Thoracic T3 extended rotated and side bent right T6 extended rotated and side bent left  Lumbar L2 flexed rotated and side bent right  Sacrum Right on right    Impression and Recommendations:     This case required medical decision making of moderate complexity.

## 2014-08-08 NOTE — Assessment & Plan Note (Signed)
Patient does have chronic low back pain as well as I think her other underlying problems of bipolar disorder as well as a chronic fatigue fibromyalgia syndrome are likely contributing. Patient will likely having continuing increasing pain secondary to her pregnancy. We'll have patient come back and see me again in 3 weeks for further evaluation and treatment. We did discuss new postural exercises and changes that may be beneficial.

## 2014-08-08 NOTE — Assessment & Plan Note (Signed)
Decision today to treat with OMT was based on Physical Exam  After verbal consent patient was treated with Soft tissue and counterstrain  techniques in cervical, thoracic and lumbar and sacral areas  Patient tolerated the procedure well with improvement in symptoms  Patient given exercises, stretches and lifestyle modifications  See medications in patient instructions if given  Patient will follow up in 3 weeks

## 2014-08-08 NOTE — Progress Notes (Signed)
Pre visit review using our clinic review tool, if applicable. No additional management support is needed unless otherwise documented below in the visit note. 

## 2014-08-29 ENCOUNTER — Encounter: Payer: Self-pay | Admitting: Family Medicine

## 2014-08-29 ENCOUNTER — Ambulatory Visit (INDEPENDENT_AMBULATORY_CARE_PROVIDER_SITE_OTHER): Payer: Managed Care, Other (non HMO) | Admitting: Family Medicine

## 2014-08-29 VITALS — BP 116/82 | HR 107 | Ht 64.0 in | Wt 181.0 lb

## 2014-08-29 DIAGNOSIS — M9902 Segmental and somatic dysfunction of thoracic region: Secondary | ICD-10-CM

## 2014-08-29 DIAGNOSIS — M999 Biomechanical lesion, unspecified: Secondary | ICD-10-CM

## 2014-08-29 DIAGNOSIS — M9903 Segmental and somatic dysfunction of lumbar region: Secondary | ICD-10-CM

## 2014-08-29 DIAGNOSIS — M545 Low back pain, unspecified: Secondary | ICD-10-CM

## 2014-08-29 DIAGNOSIS — G8929 Other chronic pain: Secondary | ICD-10-CM

## 2014-08-29 DIAGNOSIS — M9904 Segmental and somatic dysfunction of sacral region: Secondary | ICD-10-CM

## 2014-08-29 NOTE — Progress Notes (Signed)
  [facility] [address] [place], [place] [address] Phone: [phone] Subjective:     CC: Chronic low back pain follow up  [id number] Katie [name] is a 34 y.o. female coming in with complaint of chronic low back pain.  Patient was seen previously and did have more of a chronic low back pain. Patient is pregnant now approximately 15.[redacted] weeks gestation. . Patient continues to have some mild low back pain. This that is very manageable. Patient sits of the mid back pain may be somewhat improved.  Reviewing patient's previous imaging was 2012 of the lumbar spine. This was reviewed by me and this was unremarkable.    Past medical history, social, surgical and family history all reviewed in electronic medical record.   Review of Systems: No headache, visual changes, nausea, vomiting, diarrhea, constipation, dizziness, abdominal pain, skin rash, fevers, chills, night sweats, weight loss, swollen lymph nodes, body aches, joint swelling, muscle aches, chest pain, shortness of breath, mood changes.   Objective Blood pressure 116/82, pulse 107, height 5\' 4"  (1.626 m), weight 181 lb (82.101 kg), SpO2 96 %.  General: No apparent distress alert and oriented x3 mood and affect normal, dressed appropriately.  HEENT: Pupils equal, extraocular movements intact  Respiratory: Patient's speak in full sentences and does not appear short of breath  Cardiovascular: No lower extremity edema, non tender, no erythema  Skin: Warm dry intact with no signs of infection or rash on extremities or on axial skeleton.  Abdomen: Soft nontender  Neuro: Cranial nerves II through XII are intact, neurovascularly intact in all extremities with 2+ DTRs and 2+ pulses.  Lymph: No lymphadenopathy of posterior or anterior cervical chain or axillae bilaterally.  Gait normal with good balance and coordination.  MSK:  Non tender with full range of motion and good stability and symmetric strength and  tone of shoulders, elbows, wrist, hip, knee and ankles bilaterally.  Back Exam:  Inspection: Mild decrease in lordosis Motion: Flexion 45 deg, Extension 45 deg, Side Bending to 45 deg bilaterally,  Rotation to 45 deg bilaterally  SLR laying: Negative  XSLR laying: Negative  Palpable tenderness: Continued mild paraspinal musculature tenderness of the lumbar spine bilaterally FABER: negative. Sensory change: Gross sensation intact to all lumbar and sacral dermatomes.  Reflexes: 2+ at both patellar tendons, 2+ at achilles tendons, Babinski's downgoing.  Strength at foot  Plantar-flexion: 5/5 Dorsi-flexion: 5/5 Eversion: 5/5 Inversion: 5/5  Leg strength  Quad: 5/5 Hamstring: 5/5 Hip flexor: 5/5 Hip abductors: 4/5  Gait unremarkable. Minimal change from previous exam  Osteopathic findings Cervical C4 flexed rotated and side bent right  Thoracic T3 extended rotated and side bent right T6 extended rotated and side bent left  Lumbar L2 flexed rotated and side bent right  Sacrum Right on right    Impression and Recommendations:     This case required medical decision making of moderate complexity.

## 2014-08-29 NOTE — Progress Notes (Signed)
Pre visit review using our clinic review tool, if applicable. No additional management support is needed unless otherwise documented below in the visit note. 

## 2014-08-29 NOTE — Assessment & Plan Note (Signed)
Patient is responding fairly well to the osteopathic manipulation. We will continue with the indirect techniques. We discussed the icing regimen and continue to avoid certain medications. We discussed B6 supplementation be the safest thing for any type of nausea. Patient encouraged to do the exercises regularly and we discussed proper lifting mechanics. Patient will come back again in 2-3 weeks for further evaluation and treatment.

## 2014-08-29 NOTE — Patient Instructions (Addendum)
Good to see you You are doing great!!! Conitnue with the posture as much as you can.  Ice is your friend B6 200mg  daily can help with the nausea.  See me again 2-3 weeks

## 2014-09-15 ENCOUNTER — Ambulatory Visit (INDEPENDENT_AMBULATORY_CARE_PROVIDER_SITE_OTHER): Payer: Managed Care, Other (non HMO) | Admitting: Family Medicine

## 2014-09-15 ENCOUNTER — Encounter: Payer: Self-pay | Admitting: Family Medicine

## 2014-09-15 VITALS — BP 118/74 | HR 96 | Ht 64.0 in | Wt 182.0 lb

## 2014-09-15 DIAGNOSIS — M9902 Segmental and somatic dysfunction of thoracic region: Secondary | ICD-10-CM

## 2014-09-15 DIAGNOSIS — M999 Biomechanical lesion, unspecified: Secondary | ICD-10-CM

## 2014-09-15 DIAGNOSIS — M9904 Segmental and somatic dysfunction of sacral region: Secondary | ICD-10-CM

## 2014-09-15 DIAGNOSIS — M545 Low back pain, unspecified: Secondary | ICD-10-CM

## 2014-09-15 DIAGNOSIS — M9903 Segmental and somatic dysfunction of lumbar region: Secondary | ICD-10-CM

## 2014-09-15 DIAGNOSIS — G8929 Other chronic pain: Secondary | ICD-10-CM

## 2014-09-15 NOTE — Progress Notes (Signed)
  [facility] [address] [place], [place] [address] Phone: [phone] Subjective:     CC: Chronic low back pain follow up  IWL:NLGXQJJHER FLOIS MCTAGUE is a 34 y.o. female coming in with complaint of chronic low back pain.  Patient was seen previously and did have more of a chronic low back pain. Patient is pregnant now approximately 17.[redacted] weeks gestation. . Patient continues to have some mild low back pain. States the patient seems to be somewhat worse recently. Denies any dysuria or polyuria. Patient states that it gives some mild discomfort. No radiation down the legs or numbness.  Reviewing patient's previous imaging was 2012 of the lumbar spine. This was reviewed by me and this was unremarkable.    Past medical history, social, surgical and family history all reviewed in electronic medical record.   Review of Systems: No headache, visual changes, nausea, vomiting, diarrhea, constipation, dizziness, abdominal pain, skin rash, fevers, chills, night sweats, weight loss, swollen lymph nodes, body aches, joint swelling, muscle aches, chest pain, shortness of breath, mood changes.   Objective Blood pressure 118/74, pulse 96, height 5\' 4"  (1.626 m), weight 182 lb (82.555 kg), SpO2 96 %.  General: No apparent distress alert and oriented x3 mood and affect normal, dressed appropriately.  HEENT: Pupils equal, extraocular movements intact  Respiratory: Patient's speak in full sentences and does not appear short of breath  Cardiovascular: No lower extremity edema, non tender, no erythema  Skin: Warm dry intact with no signs of infection or rash on extremities or on axial skeleton.  Abdomen: Soft nontender  Neuro: Cranial nerves II through XII are intact, neurovascularly intact in all extremities with 2+ DTRs and 2+ pulses.  Lymph: No lymphadenopathy of posterior or anterior cervical chain or axillae bilaterally.  Gait normal with good balance and coordination.    MSK:  Non tender with full range of motion and good stability and symmetric strength and tone of shoulders, elbows, wrist, hip, knee and ankles bilaterally.  Back Exam:  Inspection: Mild increase in lordosis Motion: Flexion 45 deg, Extension 45 deg, Side Bending to 45 deg bilaterally,  Rotation to 45 deg bilaterally  SLR laying: Negative  XSLR laying: Negative  Palpable tenderness: Improved tenderness of the thoracic spine but worsening of the lumbar spine. FABER: negative. Sensory change: Gross sensation intact to all lumbar and sacral dermatomes.  Reflexes: 2+ at both patellar tendons, 2+ at achilles tendons, Babinski's downgoing.  Strength at foot  Plantar-flexion: 5/5 Dorsi-flexion: 5/5 Eversion: 5/5 Inversion: 5/5  Leg strength  Quad: 5/5 Hamstring: 5/5 Hip flexor: 5/5 Hip abductors: 4/5  Gait unremarkable. Minimal change from previous exam  Osteopathic findings Cervical C4 flexed rotated and side bent right  Thoracic T3 extended rotated and side bent right T6 extended rotated and side bent left  Lumbar L2 flexed rotated and side bent right  Sacrum Right on right    Impression and Recommendations:     This case required medical decision making of moderate complexity.

## 2014-09-15 NOTE — Patient Instructions (Signed)
Keep going You are doing great Try a body pillow, tell him cheaper then a mattress.  Continue the exercises when you can See me again in 3 weeks.

## 2014-09-15 NOTE — Progress Notes (Signed)
Pre visit review using our clinic review tool, if applicable. No additional management support is needed unless otherwise documented below in the visit note. 

## 2014-09-15 NOTE — Assessment & Plan Note (Signed)
Decision today to treat with OMT was based on Physical Exam  After verbal consent patient was treated with Soft tissue and counterstrain  techniques in cervical, thoracic and lumbar and sacral areas  Patient tolerated the procedure well with improvement in symptoms  Patient given exercises, stretches and lifestyle modifications  See medications in patient instructions if given  Patient will follow up in 3 weeks

## 2014-09-15 NOTE — Assessment & Plan Note (Signed)
Patient's chronic pain is going to likely increase during her second trimester. We discussed icing regimen and home exercises. Discussed icing regimen and continuing the natural supplementation. Patient is going to continue with the home exercises and continue see me on a regular basis and will come back again in 3 weeks for further evaluation and treatment.

## 2014-10-05 ENCOUNTER — Ambulatory Visit (INDEPENDENT_AMBULATORY_CARE_PROVIDER_SITE_OTHER): Payer: Managed Care, Other (non HMO) | Admitting: Family Medicine

## 2014-10-05 ENCOUNTER — Encounter: Payer: Self-pay | Admitting: Family Medicine

## 2014-10-05 VITALS — HR 92 | Ht 64.0 in | Wt 184.0 lb

## 2014-10-05 DIAGNOSIS — M9904 Segmental and somatic dysfunction of sacral region: Secondary | ICD-10-CM | POA: Diagnosis not present

## 2014-10-05 DIAGNOSIS — M545 Low back pain: Secondary | ICD-10-CM

## 2014-10-05 DIAGNOSIS — M999 Biomechanical lesion, unspecified: Secondary | ICD-10-CM

## 2014-10-05 DIAGNOSIS — M9902 Segmental and somatic dysfunction of thoracic region: Secondary | ICD-10-CM

## 2014-10-05 DIAGNOSIS — M9903 Segmental and somatic dysfunction of lumbar region: Secondary | ICD-10-CM | POA: Diagnosis not present

## 2014-10-05 DIAGNOSIS — G8929 Other chronic pain: Secondary | ICD-10-CM

## 2014-10-05 NOTE — Progress Notes (Signed)
  Katie [name] [address] [place], [place] [address] Phone: [phone] Subjective:     CC: Chronic low back pain follow up  [id number] Katie [name] is a 34 y.o. female coming in with complaint of chronic low back pain.  Patient was seen previously and did have more of a chronic low back pain. Patient is pregnant now approximately [redacted] weeks gestation. .  Patient states low back is better sometimes and later can be worse.  Depending on the day. Denies any radiation down the legs, any numbness or tingling. Patient did find out that it it is a boy [name]   Reviewing patient's previous imaging was 2012 of the lumbar spine. This was reviewed by me and this was unremarkable.    Past medical history, social, surgical and family history all reviewed in electronic medical record.   Review of Systems: No headache, visual changes, nausea, vomiting, diarrhea, constipation, dizziness, abdominal pain, skin rash, fevers, chills, night sweats, weight loss, swollen lymph nodes, body aches, joint swelling, muscle aches, chest pain, shortness of breath, mood changes.   Objective Pulse 92, height 5\' 4"  (1.626 m), weight 184 lb (83.462 kg), SpO2 98 %.  General: No apparent distress alert and oriented x3 mood and affect normal, dressed appropriately.  HEENT: Pupils equal, extraocular movements intact  Respiratory: Patient's speak in full sentences and does not appear short of breath  Cardiovascular: No lower extremity edema, non tender, no erythema  Skin: Warm dry intact with no signs of infection or rash on extremities or on axial skeleton.  Abdomen: Soft nontender  Neuro: Cranial nerves II through XII are intact, neurovascularly intact in all extremities with 2+ DTRs and 2+ pulses.  Lymph: No lymphadenopathy of posterior or anterior cervical chain or axillae bilaterally.  Gait normal with good balance and coordination.  MSK:  Non tender with full range of motion and  good stability and symmetric strength and tone of shoulders, elbows, wrist, hip, knee and ankles bilaterally.  Back Exam:  Inspection: Mild increase in lordosis Motion: Flexion 45 deg, Extension 45 deg, Side Bending to 45 deg bilaterally,  Rotation to 45 deg bilaterally  SLR laying: Negative  XSLR laying: Negative  Palpable tenderness: Improved tenderness of the thoracic spine but worsening of the lumbar spine. FABER: negative. Sensory change: Gross sensation intact to all lumbar and sacral dermatomes.  Reflexes: 2+ at both patellar tendons, 2+ at achilles tendons, Babinski's downgoing.  Strength at foot  Plantar-flexion: 5/5 Dorsi-flexion: 5/5 Eversion: 5/5 Inversion: 5/5  Leg strength  Quad: 5/5 Hamstring: 5/5 Hip flexor: 5/5 Hip abductors: 4/5  Gait unremarkable. No change from previous exam.   Osteopathic findings Cervical C4 flexed rotated and side bent right  Thoracic T3 extended rotated and side bent right T6 extended rotated and side bent left  Lumbar L2 flexed rotated and side bent right  Sacrum Right on right    Impression and Recommendations:     This case required medical decision making of moderate complexity.

## 2014-10-05 NOTE — Progress Notes (Signed)
Pre visit review using our clinic review tool, if applicable. No additional management support is needed unless otherwise documented below in the visit note. 

## 2014-10-05 NOTE — Assessment & Plan Note (Signed)
Patient's chronic back pain and fibromyalgia are likely contributing. Patient will also be having increasing progesterone which will cause some more laxity of her joints. We discussed different ergonomic changes when sitting long periods of time as well as the home exercises. Patient has pain medications from other providers. We discussed the possible side effects to this to her as well as her fetus. Patient's will continue with the conservative therapy and will come back and see me again in 3 weeks for further evaluation and treatment.

## 2014-10-05 NOTE — Patient Instructions (Signed)
Great to see you You are looking like you are doing well. Keep it up The exercises are huge doing them 3-5 times a week will be helpful.  When sitting always have back support.  Wear good shoes as much as you can.  See me again in 3 weeks.

## 2014-10-05 NOTE — Assessment & Plan Note (Signed)
Decision today to treat with OMT was based on Physical Exam  After verbal consent patient was treated with Soft tissue and counterstrain  techniques in cervical, thoracic and lumbar and sacral areas  Patient tolerated the procedure well with improvement in symptoms  Patient given exercises, stretches and lifestyle modifications  See medications in patient instructions if given  Patient will follow up in 3 weeks

## 2014-10-16 ENCOUNTER — Other Ambulatory Visit: Payer: Self-pay | Admitting: Internal Medicine

## 2014-10-18 ENCOUNTER — Encounter: Payer: Self-pay | Admitting: Internal Medicine

## 2014-10-19 MED ORDER — HYDROCODONE-ACETAMINOPHEN 5-325 MG PO TABS: 1.0000 | ORAL_TABLET | Freq: Four times a day (QID) | ORAL | Status: DC | PRN

## 2014-10-20 NOTE — Telephone Encounter (Signed)
rx ready for pick up and patient is aware  

## 2014-10-25 ENCOUNTER — Ambulatory Visit: Payer: Managed Care, Other (non HMO) | Admitting: Family Medicine

## 2014-11-08 ENCOUNTER — Ambulatory Visit: Payer: Managed Care, Other (non HMO) | Admitting: Internal Medicine

## 2014-11-09 ENCOUNTER — Other Ambulatory Visit: Payer: Self-pay | Admitting: Internal Medicine

## 2014-11-29 ENCOUNTER — Ambulatory Visit: Payer: Managed Care, Other (non HMO) | Admitting: Internal Medicine

## 2014-12-13 ENCOUNTER — Encounter: Payer: Self-pay | Admitting: Internal Medicine

## 2014-12-13 ENCOUNTER — Ambulatory Visit: Payer: Self-pay | Admitting: Internal Medicine

## 2014-12-13 ENCOUNTER — Ambulatory Visit (INDEPENDENT_AMBULATORY_CARE_PROVIDER_SITE_OTHER): Payer: Managed Care, Other (non HMO) | Admitting: Internal Medicine

## 2014-12-13 VITALS — BP 120/80 | HR 106 | Temp 99.3°F | Resp 20 | Ht 64.0 in | Wt 191.0 lb

## 2014-12-13 DIAGNOSIS — R1013 Epigastric pain: Secondary | ICD-10-CM

## 2014-12-13 DIAGNOSIS — M797 Fibromyalgia: Secondary | ICD-10-CM

## 2014-12-13 DIAGNOSIS — G8929 Other chronic pain: Secondary | ICD-10-CM | POA: Diagnosis not present

## 2014-12-13 DIAGNOSIS — M545 Low back pain: Secondary | ICD-10-CM

## 2014-12-13 NOTE — Patient Instructions (Signed)
Report any new or worsening symptoms 

## 2014-12-13 NOTE — Progress Notes (Signed)
Pre visit review using our clinic review tool, if applicable. No additional management support is needed unless otherwise documented below in the visit note. 

## 2014-12-13 NOTE — Progress Notes (Signed)
Subjective:    Patient ID: Katie [name], female    DOB: 1981-04-04, 34 y.o.   MRN: [id number]  HPI  34 year old patient who is seen with a chief complaint of mid back pain.  She is status post cholecystectomy in September of last year.  In January she had a follow-up abdominal ultrasound due to a similar pain to rule out retained stones.  This was normal.  She continues to have paroxysm of pain.  This started in mid back area and radiate to the epigastric and mid chest area.  Pain is of abrupt onset severe intensity, lasts approximately 5 minutes and terminates fairly quickly.  No nausea or other GI complaints.  She is pregnant with a delivery date in August  Past Medical History  Diagnosis Date  . Asthma   . Bipolar affective disorder     onogoing Dr Nicholaus Corolla treatment for the past 10 years  . GERD (gastroesophageal reflux disease)   . Allergy   . Migraines     teenage onset with periods  . History of recurrent UTIs     cystoscopy with Dr Barnie Del [facility]  . History of kidney stones     highschool    History   Social History  . Marital Status: Married    Spouse Name: N/A  . Number of Children: N/A  . Years of Education: N/A   Occupational History  . preschool teacher and nanny    Social History Main Topics  . Smoking status: Never Smoker   . Smokeless tobacco: Never Used  . Alcohol Use: 2.4 oz/week    4 Cans of beer per week  . Drug Use: No  . Sexual Activity: Yes    Birth Control/ Protection: Pill   Other Topics Concern  . Not on file   Social History Narrative   Grew up in [place]   Married 5 yrs   Occupation - prev occupation.  Preschool teacher and nanny   Never smoked.  No etoh no drugs    Past Surgical History  Procedure Laterality Date  . Adenoidectomy      1990  . Knee surgery      left knee torn cartilage removed 1994  . Hemangioma excision      2009 removal from right ar    Family History  Problem Relation Age of Onset  .  Alcohol abuse Paternal Grandmother   . Alcohol abuse Paternal Grandfather   . Lung cancer Paternal Grandfather   . Alcohol abuse Maternal Grandfather   . Prostate cancer Maternal Grandfather   . Osteoarthritis Mother   . Lymphoma Mother     Non-Hodgkin's lymphoma  . Hyperlipidemia Mother   . Hypertension Mother   . Diabetes Mother     type 2  . Osteoarthritis Father   . Other Father     Depression.anxiety  . Lung cancer Maternal Grandmother   . Diabetes Maternal Grandmother   . Diabetes Maternal Aunt     Allergies  Allergen Reactions  . Zonisamide Nausea And Vomiting    Current Outpatient Prescriptions on File Prior to Visit  Medication Sig Dispense Refill  . Fluvoxamine Maleate (LUVOX CR) 100 MG CP24 Take 1 capsule (100 mg total) by mouth 2 (two) times daily. 60 each 3  . HYDROcodone-acetaminophen (NORCO/VICODIN) 5-325 MG per tablet Take 1 tablet by mouth every 6 (six) hours as needed for moderate pain. 90 tablet 0  . Prenat w/o A-[id number] (CITRANATAL ASSURE) 35-1 & 300 MG tablet  Take 1 tablet by mouth 2 (two) times daily.    . ranitidine (ZANTAC) 150 MG tablet TAKE ONE TABLET BY MOUTH TWICE DAILY 60 tablet 3   No current facility-administered medications on file prior to visit.    BP 120/80 mmHg  Pulse 106  Temp(Src) 99.3 F (37.4 C) (Oral)  Resp 20  Ht 5\' 4"  (1.626 m)  Wt 191 lb (86.637 kg)  BMI 32.77 kg/m2  SpO2 98%  LMP 05/16/2014 (Approximate)       Review of Systems  Constitutional: Negative.   HENT: Negative for congestion, dental problem, hearing loss, rhinorrhea, sinus pressure, sore throat and tinnitus.   Eyes: Negative for pain, discharge and visual disturbance.  Respiratory: Negative for cough and shortness of breath.   Cardiovascular: Negative for chest pain, palpitations and leg swelling.  Gastrointestinal: Negative for nausea, vomiting, abdominal pain, diarrhea, constipation, blood in stool and abdominal distention.  Genitourinary:  Negative for dysuria, urgency, frequency, hematuria, flank pain, vaginal bleeding, vaginal discharge, difficulty urinating, vaginal pain and pelvic pain.  Musculoskeletal: Positive for back pain. Negative for joint swelling, arthralgias and gait problem.  Skin: Negative for rash.  Neurological: Negative for dizziness, syncope, speech difficulty, weakness, numbness and headaches.  Hematological: Negative for adenopathy.  Psychiatric/Behavioral: Negative for behavioral problems, dysphoric mood and agitation. The patient is not nervous/anxious.        Objective:   Physical Exam  Constitutional: She is oriented to person, place, and time. She appears well-developed and well-nourished.  HENT:  Head: Normocephalic.  Right Ear: External ear normal.  Left Ear: External ear normal.  Mouth/Throat: Oropharynx is clear and moist.  Eyes: Conjunctivae and EOM are normal. Pupils are equal, round, and reactive to light.  Neck: Normal range of motion. Neck supple. No thyromegaly present.  Cardiovascular: Normal rate, regular rhythm, normal heart sounds and intact distal pulses.   Mild resting tachycardia  Pulmonary/Chest: Effort normal and breath sounds normal.  Abdominal: Soft. Bowel sounds are normal. She exhibits no mass. There is no tenderness.  Gravid No epigastric tenderness  Musculoskeletal: Normal range of motion.  Musculature in the thoracic and lumbar area, slightly tight and tense  Lymphadenopathy:    She has no cervical adenopathy.  Neurological: She is alert and oriented to person, place, and time.  Skin: Skin is warm and dry. No rash noted.  Psychiatric: She has a normal mood and affect. Her behavior is normal.          Assessment & Plan:   Mid back pain with radiation to the mid chest area.  Sounds more musculoligamentous.  Will try some gentle stretching.  Heat  therapy and massage therapy if symptoms worsen History of chronic low back pain History of chronic fatigue,  fibromyalgia syndrome

## 2014-12-17 ENCOUNTER — Inpatient Hospital Stay (HOSPITAL_COMMUNITY): Payer: Managed Care, Other (non HMO)

## 2014-12-17 ENCOUNTER — Encounter (HOSPITAL_COMMUNITY): Payer: Self-pay | Admitting: *Deleted

## 2014-12-17 ENCOUNTER — Inpatient Hospital Stay (HOSPITAL_COMMUNITY)
Admission: AD | Admit: 2014-12-17 | Discharge: 2014-12-17 | Disposition: A | Discharge status: Home / Self Care | Payer: Managed Care, Other (non HMO) | Source: Ambulatory Visit | Attending: Obstetrics and Gynecology | Admitting: Obstetrics and Gynecology

## 2014-12-17 DIAGNOSIS — B373 Candidiasis of vulva and vagina: Secondary | ICD-10-CM | POA: Diagnosis not present

## 2014-12-17 DIAGNOSIS — F319 Bipolar disorder, unspecified: Secondary | ICD-10-CM | POA: Diagnosis not present

## 2014-12-17 DIAGNOSIS — O99343 Other mental disorders complicating pregnancy, third trimester: Secondary | ICD-10-CM | POA: Diagnosis not present

## 2014-12-17 DIAGNOSIS — G8929 Other chronic pain: Secondary | ICD-10-CM | POA: Diagnosis not present

## 2014-12-17 DIAGNOSIS — B3731 Acute candidiasis of vulva and vagina: Secondary | ICD-10-CM

## 2014-12-17 DIAGNOSIS — O9989 Other specified diseases and conditions complicating pregnancy, childbirth and the puerperium: Secondary | ICD-10-CM | POA: Diagnosis present

## 2014-12-17 DIAGNOSIS — O98813 Other maternal infectious and parasitic diseases complicating pregnancy, third trimester: Secondary | ICD-10-CM | POA: Diagnosis not present

## 2014-12-17 DIAGNOSIS — Z3A30 30 weeks gestation of pregnancy: Secondary | ICD-10-CM | POA: Diagnosis not present

## 2014-12-17 DIAGNOSIS — O4403 Placenta previa specified as without hemorrhage, third trimester: Secondary | ICD-10-CM | POA: Insufficient documentation

## 2014-12-17 DIAGNOSIS — Z3689 Encounter for other specified antenatal screening: Secondary | ICD-10-CM

## 2014-12-17 LAB — URINE MICROSCOPIC-ADD ON

## 2014-12-17 LAB — URINALYSIS, ROUTINE W REFLEX MICROSCOPIC
Bilirubin Urine: NEGATIVE
Glucose, UA: NEGATIVE mg/dL
KETONES UR: NEGATIVE mg/dL
Nitrite: NEGATIVE
PH: 6 (ref 5.0–8.0)
PROTEIN: NEGATIVE mg/dL
Specific Gravity, Urine: 1.015 (ref 1.005–1.030)
Urobilinogen, UA: 0.2 mg/dL (ref 0.0–1.0)

## 2014-12-17 LAB — WET PREP, GENITAL
CLUE CELLS WET PREP: NONE SEEN
Trich, Wet Prep: NONE SEEN

## 2014-12-17 LAB — AMNISURE RUPTURE OF MEMBRANE (ROM) NOT AT ARMC: Amnisure ROM: NEGATIVE

## 2014-12-17 MED ORDER — FLUCONAZOLE 150 MG PO TABS: 150.0000 mg | ORAL_TABLET | Freq: Once | ORAL | Status: DC

## 2014-12-17 NOTE — Progress Notes (Signed)
Pt noted "mucous plug" discharge last night. 1130 am she noted thin clear discharge. She spoke to [facility] and was told to come in and have it evaluated. Denies bleeding. Reports normal FM.

## 2014-12-17 NOTE — Progress Notes (Signed)
Pt has multiple dime sized circular areas on both legs. When asked, she states she "picks scabs" due to her OCD.

## 2014-12-17 NOTE — Discharge Instructions (Signed)
Fetal Movement Counts °Patient Name: __________________________________________________ Patient Due Date: ____________________ °Performing a fetal movement count is highly recommended in high-risk pregnancies, but it is good for every pregnant woman to do. Your health care provider may ask you to start counting fetal movements at 28 weeks of the pregnancy. Fetal movements often increase: °· After eating a full meal. °· After physical activity. °· After eating or drinking something sweet or cold. °· At rest. °Pay attention to when you feel the baby is most active. This will help you notice a pattern of your baby's sleep and wake cycles and what factors contribute to an increase in fetal movement. It is important to perform a fetal movement count at the same time each day when your baby is normally most active.  °HOW TO COUNT FETAL MOVEMENTS °1. Find a quiet and comfortable area to sit or lie down on your left side. Lying on your left side provides the best blood and oxygen circulation to your baby. °2. Write down the day and time on a sheet of paper or in a journal. °3. Start counting kicks, flutters, swishes, rolls, or jabs in a 2-hour period. You should feel at least 10 movements within 2 hours. °4. If you do not feel 10 movements in 2 hours, wait 2-3 hours and count again. Look for a change in the pattern or not enough counts in 2 hours. °SEEK MEDICAL CARE IF: °· You feel less than 10 counts in 2 hours, tried twice. °· There is no movement in over an hour. °· The pattern is changing or taking longer each day to reach 10 counts in 2 hours. °· You feel the baby is not moving as he or she usually does. °Date: ____________ Movements: ____________ Start time: ____________ Finish time: ____________  °Date: ____________ Movements: ____________ Start time: ____________ Finish time: ____________ °Date: ____________ Movements: ____________ Start time: ____________ Finish time: ____________ °Date: ____________ Movements:  ____________ Start time: ____________ Finish time: ____________ °Date: ____________ Movements: ____________ Start time: ____________ Finish time: ____________ °Date: ____________ Movements: ____________ Start time: ____________ Finish time: ____________ °Date: ____________ Movements: ____________ Start time: ____________ Finish time: ____________ °Date: ____________ Movements: ____________ Start time: ____________ Finish time: ____________  °Date: ____________ Movements: ____________ Start time: ____________ Finish time: ____________ °Date: ____________ Movements: ____________ Start time: ____________ Finish time: ____________ °Date: ____________ Movements: ____________ Start time: ____________ Finish time: ____________ °Date: ____________ Movements: ____________ Start time: ____________ Finish time: ____________ °Date: ____________ Movements: ____________ Start time: ____________ Finish time: ____________ °Date: ____________ Movements: ____________ Start time: ____________ Finish time: ____________ °Date: ____________ Movements: ____________ Start time: ____________ Finish time: ____________  °Date: ____________ Movements: ____________ Start time: ____________ Finish time: ____________ °Date: ____________ Movements: ____________ Start time: ____________ Finish time: ____________ °Date: ____________ Movements: ____________ Start time: ____________ Finish time: ____________ °Date: ____________ Movements: ____________ Start time: ____________ Finish time: ____________ °Date: ____________ Movements: ____________ Start time: ____________ Finish time: ____________ °Date: ____________ Movements: ____________ Start time: ____________ Finish time: ____________ °Date: ____________ Movements: ____________ Start time: ____________ Finish time: ____________  °Date: ____________ Movements: ____________ Start time: ____________ Finish time: ____________ °Date: ____________ Movements: ____________ Start time: ____________ Finish  time: ____________ °Date: ____________ Movements: ____________ Start time: ____________ Finish time: ____________ °Date: ____________ Movements: ____________ Start time: ____________ Finish time: ____________ °Date: ____________ Movements: ____________ Start time: ____________ Finish time: ____________ °Date: ____________ Movements: ____________ Start time: ____________ Finish time: ____________ °Date: ____________ Movements: ____________ Start time: ____________ Finish time: ____________  °Date: ____________ Movements: ____________ Start time: ____________ Finish   time: ____________ °Date: ____________ Movements: ____________ Start time: ____________ Finish time: ____________ °Date: ____________ Movements: ____________ Start time: ____________ Finish time: ____________ °Date: ____________ Movements: ____________ Start time: ____________ Finish time: ____________ °Date: ____________ Movements: ____________ Start time: ____________ Finish time: ____________ °Date: ____________ Movements: ____________ Start time: ____________ Finish time: ____________ °Date: ____________ Movements: ____________ Start time: ____________ Finish time: ____________  °Date: ____________ Movements: ____________ Start time: ____________ Finish time: ____________ °Date: ____________ Movements: ____________ Start time: ____________ Finish time: ____________ °Date: ____________ Movements: ____________ Start time: ____________ Finish time: ____________ °Date: ____________ Movements: ____________ Start time: ____________ Finish time: ____________ °Date: ____________ Movements: ____________ Start time: ____________ Finish time: ____________ °Date: ____________ Movements: ____________ Start time: ____________ Finish time: ____________ °Date: ____________ Movements: ____________ Start time: ____________ Finish time: ____________  °Date: ____________ Movements: ____________ Start time: ____________ Finish time: ____________ °Date: ____________  Movements: ____________ Start time: ____________ Finish time: ____________ °Date: ____________ Movements: ____________ Start time: ____________ Finish time: ____________ °Date: ____________ Movements: ____________ Start time: ____________ Finish time: ____________ °Date: ____________ Movements: ____________ Start time: ____________ Finish time: ____________ °Date: ____________ Movements: ____________ Start time: ____________ Finish time: ____________ °Date: ____________ Movements: ____________ Start time: ____________ Finish time: ____________  °Date: ____________ Movements: ____________ Start time: ____________ Finish time: ____________ °Date: ____________ Movements: ____________ Start time: ____________ Finish time: ____________ °Date: ____________ Movements: ____________ Start time: ____________ Finish time: ____________ °Date: ____________ Movements: ____________ Start time: ____________ Finish time: ____________ °Date: ____________ Movements: ____________ Start time: ____________ Finish time: ____________ °Date: ____________ Movements: ____________ Start time: ____________ Finish time: ____________ °Document Released: 07/30/2006 Document Revised: 11/14/2013 Document Reviewed: 04/26/2012 °[employer]® Patient Information ©2015 [employer]. This information is not intended to replace advice given to you by your health care provider. Make sure you discuss any questions you have with your health care provider. °Braxton Hicks Contractions °Contractions of the uterus can occur throughout pregnancy. Contractions are not always a sign that you are in labor.  °WHAT ARE BRAXTON HICKS CONTRACTIONS?  °Contractions that occur before labor are called Braxton Hicks contractions, or false labor. Toward the end of pregnancy (32-34 weeks), these contractions can develop more often and may become more forceful. This is not true labor because these contractions do not result in opening (dilatation) and thinning of the cervix. They  are sometimes difficult to tell apart from true labor because these contractions can be forceful and people have different pain tolerances. You should not feel embarrassed if you go to the hospital with false labor. Sometimes, the only way to tell if you are in true labor is for your health care provider to look for changes in the cervix. °If there are no prenatal problems or other health problems associated with the pregnancy, it is completely safe to be sent home with false labor and await the onset of true labor. °HOW CAN YOU TELL THE DIFFERENCE BETWEEN TRUE AND FALSE LABOR? °False Labor °· The contractions of false labor are usually shorter and not as hard as those of true labor.   °· The contractions are usually irregular.   °· The contractions are often felt in the front of the lower abdomen and in the groin.   °· The contractions may go away when you walk around or change positions while lying down.   °· The contractions get weaker and are shorter lasting as time goes on.   °· The contractions do not usually become progressively stronger, regular, and closer together as with true labor.   °True Labor °5. Contractions in true labor last 30-70 seconds, become   very regular, usually become more intense, and increase in frequency.  6. The contractions do not go away with walking.  7. The discomfort is usually felt in the top of the uterus and spreads to the lower abdomen and low back.  8. True labor can be determined by your health care provider with an exam. This will show that the cervix is dilating and getting thinner.  WHAT TO REMEMBER  Keep up with your usual exercises and follow other instructions given by your health care provider.   Take medicines as directed by your health care provider.   Keep your regular prenatal appointments.   Eat and drink lightly if you think you are going into labor.   If Braxton Hicks contractions are making you uncomfortable:   Change your position from  lying down or resting to walking, or from walking to resting.   Sit and rest in a tub of warm water.   Drink 2-3 glasses of water. Dehydration may cause these contractions.   Do slow and deep breathing several times an hour.  WHEN SHOULD I SEEK IMMEDIATE MEDICAL CARE? Seek immediate medical care if:  Your contractions become stronger, more regular, and closer together.   You have fluid leaking or gushing from your vagina.   You have a fever.   You pass blood-tinged mucus.   You have vaginal bleeding.   You have continuous abdominal pain.   You have low back pain that you never had before.   You feel your baby's head pushing down and causing pelvic pressure.   Your baby is not moving as much as it used to.  Document Released: 06/30/2005 Document Revised: 07/05/2013 Document Reviewed: 04/11/2013 [facility] Patient Information 2015 [place], [place]. This information is not intended to replace advice given to you by your health care provider. Make sure you discuss any questions you have with your health care provider. Monilial Vaginitis Vaginitis in a soreness, swelling and redness (inflammation) of the vagina and vulva. Monilial vaginitis is not a sexually transmitted infection. CAUSES  Yeast vaginitis is caused by yeast (candida) that is normally found in your vagina. With a yeast infection, the candida has overgrown in number to a point that upsets the chemical balance. SYMPTOMS   White, thick vaginal discharge.  Swelling, itching, redness and irritation of the vagina and possibly the lips of the vagina (vulva).  Burning or painful urination.  Painful intercourse. DIAGNOSIS  Things that may contribute to monilial vaginitis are: 9. Postmenopausal and virginal states. 10. Pregnancy. 11. Infections. 67. Being tired, sick or stressed, especially if you had monilial vaginitis in the past. 13. Diabetes. Good control will help lower the chance. 15. Birth control  pills. 15. Tight fitting garments. 16. Using bubble bath, feminine sprays, douches or deodorant tampons. 15. Taking certain medications that kill germs (antibiotics). 18. Sporadic recurrence can occur if you become ill. TREATMENT  Your caregiver will give you medication.  There are several kinds of anti monilial vaginal creams and suppositories specific for monilial vaginitis. For recurrent yeast infections, use a suppository or cream in the vagina 2 times a week, or as directed.  Anti-monilial or steroid cream for the itching or irritation of the vulva may also be used. Get your caregiver's permission.  Painting the vagina with methylene blue solution may help if the monilial cream does not work.  Eating yogurt may help prevent monilial vaginitis. HOME CARE INSTRUCTIONS   Finish all medication as prescribed.  Do not have sex until treatment is  completed or after your caregiver tells you it is okay.  Take warm sitz baths.  Do not douche.  Do not use tampons, especially scented ones.  Wear cotton underwear.  Avoid tight pants and panty hose.  Tell your sexual partner that you have a yeast infection. They should go to their caregiver if they have symptoms such as mild rash or itching.  Your sexual partner should be treated as well if your infection is difficult to eliminate.  Practice safer sex. Use condoms.  Some vaginal medications cause latex condoms to fail. Vaginal medications that harm condoms are:  Cleocin cream.  Butoconazole (Femstat).  Terconazole (Terazol) vaginal suppository.  Miconazole (Monistat) (may be purchased over the counter). SEEK MEDICAL CARE IF:   You have a temperature by mouth above 102 F (38.9 C).  The infection is getting worse after 2 days of treatment.  The infection is not getting better after 3 days of treatment.  You develop blisters in or around your vagina.  You develop vaginal bleeding, and it is not your menstrual  period.  You have pain when you urinate.  You develop intestinal problems.  You have pain with sexual intercourse. Document Released: 04/09/2005 Document Revised: 09/22/2011 Document Reviewed: 12/22/2008 [facility] Patient Information 2015 [place], [place]. This information is not intended to replace advice given to you by your health care provider. Make sure you discuss any questions you have with your health care provider.

## 2014-12-17 NOTE — MAU Provider Note (Signed)
History     CSN: [id number]  Arrival date and time: 12/17/14 1218   None     No chief complaint on file.  HPI Comments: G1 @30 .4 wks c/o LOF this am. Describes as clear and odorless. Passed mucous yesterday. Good FM. No ctx or cramping. No VB. Pregnancy complicated by previa then low-lying placenta on sono 2 wks ago, Bipolar dz stable on Luvox, chronic pain taking Norco.   OB History    Gravida Para Term Preterm AB TAB SAB Ectopic Multiple Living   1               Past Medical History  Diagnosis Date  . Asthma   . Bipolar affective disorder     onogoing Dr Katie [name] treatment for the past 10 years  . GERD (gastroesophageal reflux disease)   . Allergy   . Migraines     teenage onset with periods  . History of recurrent UTIs     cystoscopy with Dr Katie [name] [facility]  . History of kidney stones     highschool    Past Surgical History  Procedure Laterality Date  . Adenoidectomy      1990  . Knee surgery      left knee torn cartilage removed 1994  . Hemangioma excision      2009 removal from right ar    Family History  Problem Relation Age of Onset  . Alcohol abuse Paternal Grandmother   . Alcohol abuse Paternal Grandfather   . Lung cancer Paternal Grandfather   . Alcohol abuse Maternal Grandfather   . Prostate cancer Maternal Grandfather   . Osteoarthritis Mother   . Lymphoma Mother     Non-Hodgkin's lymphoma  . Hyperlipidemia Mother   . Hypertension Mother   . Diabetes Mother     type 2  . Osteoarthritis Father   . Other Father     Depression.anxiety  . Lung cancer Maternal Grandmother   . Diabetes Maternal Grandmother   . Diabetes Maternal Aunt     History  Substance Use Topics  . Smoking status: Never Smoker   . Smokeless tobacco: Never Used  . Alcohol Use: 2.4 oz/week    4 Cans of beer per week    Allergies:  Allergies  Allergen Reactions  . Zonisamide Nausea And Vomiting    Prescriptions prior to admission  Medication Sig  Dispense Refill Last Dose  . fluvoxaMINE (LUVOX) 50 MG tablet Take 50 mg by mouth daily.    Taking  . Fluvoxamine Maleate (LUVOX CR) 100 MG CP24 Take 1 capsule (100 mg total) by mouth 2 (two) times daily. 60 each 3 Taking  . HYDROcodone-acetaminophen (NORCO/VICODIN) 5-325 MG per tablet Take 1 tablet by mouth every 6 (six) hours as needed for moderate pain. 90 tablet 0 Taking  . hydrOXYzine (ATARAX/VISTARIL) 25 MG tablet Take 25 mg by mouth daily as needed. TAKE ONE TABLET BY MOUTH THREE TIMES DAILY AS NEEDED for anxiety and take 2 tablets at bedtime   Taking  . Prenat w/o A-[id number] (CITRANATAL ASSURE) 35-1 & 300 MG tablet Take 1 tablet by mouth 2 (two) times daily.   Taking  . ranitidine (ZANTAC) 150 MG tablet TAKE ONE TABLET BY MOUTH TWICE DAILY 60 tablet 3 Taking    Review of Systems  Constitutional: Negative.   HENT: Negative.   Eyes: Negative.   Respiratory: Negative.   Cardiovascular: Negative.   Gastrointestinal: Negative.   Genitourinary: Negative.   Musculoskeletal: Negative.   Skin: Negative.  Neurological: Negative.   Endo/Heme/Allergies: Negative.   Psychiatric/Behavioral: Negative.    Physical Exam   Blood pressure 118/64, pulse 108, temperature 98.7 F (37.1 C), temperature source Oral, resp. rate 18, height 5\' 4"  (1.626 m), weight 86.637 kg (191 lb).  Physical Exam  Constitutional: She is oriented to person, place, and time. She appears well-developed and well-nourished.  HENT:  Head: Normocephalic and atraumatic.  Neck: Normal range of motion. Neck supple.  Cardiovascular: Normal rate and regular rhythm.   Respiratory: Effort normal and breath sounds normal.  GI: Soft. Bowel sounds are normal. She exhibits no distension. There is no tenderness.  gravid  Genitourinary:  Speculum: copious thick and thin yellow discharge, cervix visually closed Wet prep and amnisure collected Neg pool Neg fern  Musculoskeletal: Normal range of motion.  Neurological:  She is alert and oriented to person, place, and time.  Skin: Skin is warm and dry.  Psychiatric: She has a normal mood and affect.  EFM: 155 b,p, mod variability, +accels, no decels Toco: x1, mild  Results for Katie, [name] (MRN [id number]) as of 12/17/2014 14:12  Ref. Range 12/17/2014 12:25 12/17/2014 13:10 12/17/2014 13:27  Yeast Wet Prep HPF POC Latest Ref Range: NONE SEEN   MODERATE (A)   Trich, Wet Prep Latest Ref Range: NONE SEEN   NONE SEEN   Clue Cells Wet Prep HPF POC Latest Ref Range: NONE SEEN   NONE SEEN   WBC, Wet Prep HPF POC Latest Ref Range: NONE SEEN   MODERATE (A)   Appearance Latest Ref Range: CLEAR  CLEAR    Bacteria, UA Latest Ref Range: RARE  MANY (A)    Bilirubin Urine Latest Ref Range: NEGATIVE  NEGATIVE    Color, Urine Latest Ref Range: YELLOW  YELLOW    Crystals Latest Ref Range: NEGATIVE  CA OXALATE CRYSTALS (A)    Glucose Latest Ref Range: NEGATIVE mg/dL NEGATIVE    Hgb urine dipstick Latest Ref Range: NEGATIVE  TRACE (A)    Ketones, ur Latest Ref Range: NEGATIVE mg/dL NEGATIVE    Leukocytes, UA Latest Ref Range: NEGATIVE  MODERATE (A)    Nitrite Latest Ref Range: NEGATIVE  NEGATIVE    pH Latest Ref Range: 5.0-8.0  6.0    Protein Latest Ref Range: NEGATIVE mg/dL NEGATIVE    RBC / HPF Latest Ref Range: <3 RBC/hpf 0-2    Specific Gravity, Urine Latest Ref Range: 1.005-1.030  1.015    Squamous Epithelial / LPF Latest Ref Range: RARE  FEW (A)    Urobilinogen, UA Latest Ref Range: 0.0-1.0 mg/dL 0.2    WBC, UA Latest Ref Range: <3 WBC/hpf 3-6    Amnisure ROM Unknown   NEGATIVE  WET PREP, GENITAL Unknown  Rpt (A)    Sono: complete breech, AFI 20.3 cm, CL 4.5 cm, placenta 2.9 cm from os  MAU Course  Procedures NST Amnisure-neg Wet prep-+yeast, +WBC UA Sono  Assessment and Plan  30.[redacted] weeks gestation Yeast vaginitis Leukorrhea Reactive NST  Discharge home Diflucan 150 mg po #1, no refill PTL precautions FMCs Follow-up as scheduled on 12/26/14 at  [facility]  Dr. Ronita Hipps notified of A/P, agrees.  [name], N 12/17/2014, 1:12 PM

## 2015-01-17 LAB — OB RESULTS CONSOLE GBS: GBS: NEGATIVE

## 2015-02-09 ENCOUNTER — Encounter (HOSPITAL_COMMUNITY): Payer: Self-pay | Admitting: *Deleted

## 2015-02-09 ENCOUNTER — Inpatient Hospital Stay (HOSPITAL_COMMUNITY)
Admission: AD | Admit: 2015-02-09 | Discharge: 2015-02-09 | Disposition: A | Discharge status: Home / Self Care | Payer: Managed Care, Other (non HMO) | Source: Ambulatory Visit | Attending: Obstetrics & Gynecology | Admitting: Obstetrics & Gynecology

## 2015-02-09 DIAGNOSIS — Z3493 Encounter for supervision of normal pregnancy, unspecified, third trimester: Secondary | ICD-10-CM | POA: Insufficient documentation

## 2015-02-09 HISTORY — DX: Anxiety disorder, unspecified: F41.9

## 2015-02-09 HISTORY — DX: Myalgic encephalomyelitis/chronic fatigue syndrome: G93.32

## 2015-02-09 HISTORY — DX: Chronic fatigue, unspecified: R53.82

## 2015-02-09 HISTORY — DX: Depression, unspecified: F32.A

## 2015-02-09 HISTORY — DX: Major depressive disorder, single episode, unspecified: F32.9

## 2015-02-09 LAB — POCT FERN TEST

## 2015-02-09 LAB — AMNISURE RUPTURE OF MEMBRANE (ROM) NOT AT ARMC: AMNISURE: NEGATIVE

## 2015-02-09 NOTE — MAU Note (Signed)
C/o ? SROM @ 1400 today; has noticed some mild ucs and vaginal pressure today;

## 2015-02-09 NOTE — MAU Note (Signed)
Started leaking clear fluid around 1400. No bleeding.  Few braxton hicks today.  Hx of previa, has resolved

## 2015-02-09 NOTE — Discharge Instructions (Signed)
KEEP appointments in office  Third Trimester of Pregnancy The third trimester is from week 29 through week 42, months 7 through 9. This trimester is when your unborn baby (fetus) is growing very fast. At the end of the ninth month, the unborn baby is about 20 inches in length. It weighs about 6-10 pounds.  HOME CARE   Avoid all smoking, herbs, and alcohol. Avoid drugs not approved by your doctor.  Only take medicine as told by your doctor. Some medicines are safe and some are not during pregnancy.  Exercise only as told by your doctor. Stop exercising if you start having cramps.  Eat regular, healthy meals.  Wear a good support bra if your breasts are tender.  Do not use hot tubs, steam rooms, or saunas.  Wear your seat belt when driving.  Avoid raw meat, uncooked cheese, and liter boxes and soil used by cats.  Take your prenatal vitamins.  Try taking medicine that helps you poop (stool softener) as needed, and if your doctor approves. Eat more fiber by eating fresh fruit, vegetables, and whole grains. Drink enough fluids to keep your pee (urine) clear or pale yellow.  Take warm water baths (sitz baths) to soothe pain or discomfort caused by hemorrhoids. Use hemorrhoid cream if your doctor approves.  If you have puffy, bulging veins (varicose veins), wear support hose. Raise (elevate) your feet for 15 minutes, 3-4 times a day. Limit salt in your diet.  Avoid heavy lifting, wear low heels, and sit up straight.  Rest with your legs raised if you have leg cramps or low back pain.  Visit your dentist if you have not gone during your pregnancy. Use a soft toothbrush to brush your teeth. Be gentle when you floss.  You can have sex (intercourse) unless your doctor tells you not to.  Do not travel far distances unless you must. Only do so with your doctor's approval.  Take prenatal classes.  Practice driving to the hospital.  Pack your hospital bag.  Prepare the baby's  room.  Go to your doctor visits. GET HELP IF:  You are not sure if you are in labor or if your water has broken.  You are dizzy.  You have mild cramps or pressure in your lower belly (abdominal).  You have a nagging pain in your belly area.  You continue to feel sick to your stomach (nauseous), throw up (vomit), or have watery poop (diarrhea).  You have bad smelling fluid coming from your vagina.  You have pain with peeing (urination). GET HELP RIGHT AWAY IF:   You have a fever.  You are leaking fluid from your vagina.  You are spotting or bleeding from your vagina.  You have severe belly cramping or pain.  You lose or gain weight rapidly.  You have trouble catching your breath and have chest pain.  You notice sudden or extreme puffiness (swelling) of your face, hands, ankles, feet, or legs.  You have not felt the baby move in over an hour.  You have severe headaches that do not go away with medicine.  You have vision changes. Document Released: 09/24/2009 Document Revised: 10/25/2012 Document Reviewed: 08/31/2012 [facility] Patient Information 2015 [place], [place]. This information is not intended to replace advice given to you by your health care provider. Make sure you discuss any questions you have with your health care provider. Fetal Movement Counts Patient Name: __________________________________________________ Patient Due Date: ____________________ Performing a fetal movement count is highly recommended in high-risk  pregnancies, but it is good for every pregnant woman to do. Your health care provider may ask you to start counting fetal movements at 28 weeks of the pregnancy. Fetal movements often increase:  After eating a full meal.  After physical activity.  After eating or drinking something sweet or cold.  At rest. Pay attention to when you feel the baby is most active. This will help you notice a pattern of your baby's sleep and wake cycles and what  factors contribute to an increase in fetal movement. It is important to perform a fetal movement count at the same time each day when your baby is normally most active.  HOW TO COUNT FETAL MOVEMENTS  Find a quiet and comfortable area to sit or lie down on your left side. Lying on your left side provides the best blood and oxygen circulation to your baby.  Write down the day and time on a sheet of paper or in a journal.  Start counting kicks, flutters, swishes, rolls, or jabs in a 2-hour period. You should feel at least 10 movements within 2 hours.  If you do not feel 10 movements in 2 hours, wait 2-3 hours and count again. Look for a change in the pattern or not enough counts in 2 hours. SEEK MEDICAL CARE IF:  You feel less than 10 counts in 2 hours, tried twice.  There is no movement in over an hour.  The pattern is changing or taking longer each day to reach 10 counts in 2 hours.  You feel the baby is not moving as he or she usually does. Date: ____________ Movements: ____________ Start time: ____________ Elizebeth Koller time: ____________  Date: ____________ Movements: ____________ Start time: ____________ Elizebeth Koller time: ____________ Date: ____________ Movements: ____________ Start time: ____________ Elizebeth Koller time: ____________ Date: ____________ Movements: ____________ Start time: ____________ Elizebeth Koller time: ____________ Date: ____________ Movements: ____________ Start time: ____________ Elizebeth Koller time: ____________ Date: ____________ Movements: ____________ Start time: ____________ Elizebeth Koller time: ____________ Date: ____________ Movements: ____________ Start time: ____________ Elizebeth Koller time: ____________ Date: ____________ Movements: ____________ Start time: ____________ Elizebeth Koller time: ____________  Date: ____________ Movements: ____________ Start time: ____________ Elizebeth Koller time: ____________ Date: ____________ Movements: ____________ Start time: ____________ Elizebeth Koller time: ____________ Date: ____________  Movements: ____________ Start time: ____________ Elizebeth Koller time: ____________ Date: ____________ Movements: ____________ Start time: ____________ Elizebeth Koller time: ____________ Date: ____________ Movements: ____________ Start time: ____________ Elizebeth Koller time: ____________ Date: ____________ Movements: ____________ Start time: ____________ Elizebeth Koller time: ____________ Date: ____________ Movements: ____________ Start time: ____________ Elizebeth Koller time: ____________  Date: ____________ Movements: ____________ Start time: ____________ Elizebeth Koller time: ____________ Date: ____________ Movements: ____________ Start time: ____________ Elizebeth Koller time: ____________ Date: ____________ Movements: ____________ Start time: ____________ Elizebeth Koller time: ____________ Date: ____________ Movements: ____________ Start time: ____________ Elizebeth Koller time: ____________ Date: ____________ Movements: ____________ Start time: ____________ Elizebeth Koller time: ____________ Date: ____________ Movements: ____________ Start time: ____________ Elizebeth Koller time: ____________ Date: ____________ Movements: ____________ Start time: ____________ Elizebeth Koller time: ____________  Date: ____________ Movements: ____________ Start time: ____________ Elizebeth Koller time: ____________ Date: ____________ Movements: ____________ Start time: ____________ Elizebeth Koller time: ____________ Date: ____________ Movements: ____________ Start time: ____________ Elizebeth Koller time: ____________ Date: ____________ Movements: ____________ Start time: ____________ Elizebeth Koller time: ____________ Date: ____________ Movements: ____________ Start time: ____________ Elizebeth Koller time: ____________ Date: ____________ Movements: ____________ Start time: ____________ Elizebeth Koller time: ____________ Date: ____________ Movements: ____________ Start time: ____________ Elizebeth Koller time: ____________  Date: ____________ Movements: ____________ Start time: ____________ Elizebeth Koller time: ____________ Date: ____________ Movements: ____________ Start time:  ____________ Elizebeth Koller time: ____________ Date: ____________ Movements: ____________ Start time: ____________  Finish time: ____________ Date: ____________ Movements: ____________ Start time: ____________ Elizebeth Koller time: ____________ Date: ____________ Movements: ____________ Start time: ____________ Elizebeth Koller time: ____________ Date: ____________ Movements: ____________ Start time: ____________ Elizebeth Koller time: ____________ Date: ____________ Movements: ____________ Start time: ____________ Elizebeth Koller time: ____________  Date: ____________ Movements: ____________ Start time: ____________ Elizebeth Koller time: ____________ Date: ____________ Movements: ____________ Start time: ____________ Elizebeth Koller time: ____________ Date: ____________ Movements: ____________ Start time: ____________ Elizebeth Koller time: ____________ Date: ____________ Movements: ____________ Start time: ____________ Elizebeth Koller time: ____________ Date: ____________ Movements: ____________ Start time: ____________ Elizebeth Koller time: ____________ Date: ____________ Movements: ____________ Start time: ____________ Elizebeth Koller time: ____________ Date: ____________ Movements: ____________ Start time: ____________ Elizebeth Koller time: ____________  Date: ____________ Movements: ____________ Start time: ____________ Elizebeth Koller time: ____________ Date: ____________ Movements: ____________ Start time: ____________ Elizebeth Koller time: ____________ Date: ____________ Movements: ____________ Start time: ____________ Elizebeth Koller time: ____________ Date: ____________ Movements: ____________ Start time: ____________ Elizebeth Koller time: ____________ Date: ____________ Movements: ____________ Start time: ____________ Elizebeth Koller time: ____________ Date: ____________ Movements: ____________ Start time: ____________ Elizebeth Koller time: ____________ Date: ____________ Movements: ____________ Start time: ____________ Elizebeth Koller time: ____________  Date: ____________ Movements: ____________ Start time: ____________ Elizebeth Koller time: ____________ Date:  ____________ Movements: ____________ Start time: ____________ Elizebeth Koller time: ____________ Date: ____________ Movements: ____________ Start time: ____________ Elizebeth Koller time: ____________ Date: ____________ Movements: ____________ Start time: ____________ Elizebeth Koller time: ____________ Date: ____________ Movements: ____________ Start time: ____________ Elizebeth Koller time: ____________ Date: ____________ Movements: ____________ Start time: ____________ Elizebeth Koller time: ____________ Document Released: 07/30/2006 Document Revised: 11/14/2013 Document Reviewed: 04/26/2012 [employer] Patient Information 2015 [place] [place]. This information is not intended to replace advice given to you by your health care provider. Make sure you discuss any questions you have with your health care provider. Braxton Hicks Contractions Contractions of the uterus can occur throughout pregnancy. Contractions are not always a sign that you are in labor.  WHAT ARE BRAXTON HICKS CONTRACTIONS?  Contractions that occur before labor are called Braxton Hicks contractions, or false labor. Toward the end of pregnancy (32-34 weeks), these contractions can develop more often and may become more forceful. This is not true labor because these contractions do not result in opening (dilatation) and thinning of the cervix. They are sometimes difficult to tell apart from true labor because these contractions can be forceful and people have different pain tolerances. You should not feel embarrassed if you go to the hospital with false labor. Sometimes, the only way to tell if you are in true labor is for your health care provider to look for changes in the cervix. If there are no prenatal problems or other health problems associated with the pregnancy, it is completely safe to be sent home with false labor and await the onset of true labor. HOW CAN YOU TELL THE DIFFERENCE BETWEEN TRUE AND FALSE LABOR? False Labor  The contractions of false labor are usually shorter  and not as hard as those of true labor.   The contractions are usually irregular.   The contractions are often felt in the front of the lower abdomen and in the groin.   The contractions may go away when you walk around or change positions while lying down.   The contractions get weaker and are shorter lasting as time goes on.   The contractions do not usually become progressively stronger, regular, and closer together as with true labor.  True Labor  Contractions in true labor last 30-70 seconds, become very regular, usually become more intense, and increase in frequency.   The contractions do not go away with  walking.   The discomfort is usually felt in the top of the uterus and spreads to the lower abdomen and low back.   True labor can be determined by your health care provider with an exam. This will show that the cervix is dilating and getting thinner.  WHAT TO REMEMBER  Keep up with your usual exercises and follow other instructions given by your health care provider.   Take medicines as directed by your health care provider.   Keep your regular prenatal appointments.   Eat and drink lightly if you think you are going into labor.   If Braxton Hicks contractions are making you uncomfortable:   Change your position from lying down or resting to walking, or from walking to resting.   Sit and rest in a tub of warm water.   Drink 2-3 glasses of water. Dehydration may cause these contractions.   Do slow and deep breathing several times an hour.  WHEN SHOULD I SEEK IMMEDIATE MEDICAL CARE? Seek immediate medical care if:  Your contractions become stronger, more regular, and closer together.   You have fluid leaking or gushing from your vagina.   You have a fever.   You pass blood-tinged mucus.   You have vaginal bleeding.   You have continuous abdominal pain.   You have low back pain that you never had before.   You feel your baby's  head pushing down and causing pelvic pressure.   Your baby is not moving as much as it used to.  Document Released: 06/30/2005 Document Revised: 07/05/2013 Document Reviewed: 04/11/2013 [facility] Patient Information 2015 [place], [place]. This information is not intended to replace advice given to you by your health care provider. Make sure you discuss any questions you have with your health care provider.

## 2015-02-19 ENCOUNTER — Telehealth (HOSPITAL_COMMUNITY): Payer: Self-pay | Admitting: *Deleted

## 2015-02-19 ENCOUNTER — Encounter (HOSPITAL_COMMUNITY): Payer: Self-pay | Admitting: *Deleted

## 2015-02-19 NOTE — Telephone Encounter (Signed)
Preadmission screen  

## 2015-02-21 ENCOUNTER — Inpatient Hospital Stay (HOSPITAL_COMMUNITY): Payer: Managed Care, Other (non HMO)

## 2015-02-21 ENCOUNTER — Other Ambulatory Visit: Payer: Self-pay | Admitting: Obstetrics and Gynecology

## 2015-02-21 NOTE — H&P (Signed)
Katie [name] is a 34 y.o. female presenting for IOL due to psychiatric indications.  Maternal Medical History:  Contractions: Onset was less than 1 hour ago.   Frequency: irregular.   Perceived severity is mild.    Fetal activity: Perceived fetal activity is normal.   Last perceived fetal movement was within the past hour.    Prenatal complications: no prenatal complications Prenatal Complications - Diabetes: none.    OB History    Gravida Para Term Preterm AB TAB SAB Ectopic Multiple Living   1              Past Medical History  Diagnosis Date  . Asthma   . Bipolar affective disorder     onogoing Dr Nicholaus Corolla treatment for the past 10 years  . GERD (gastroesophageal reflux disease)   . Allergy   . Migraines     teenage onset with periods  . History of recurrent UTIs     cystoscopy with Dr [facility]  . History of kidney stones     highschool  . Depression   . Chronic fatigue disorder   . Anxiety     currently on luvox, hydroxyzine   Past Surgical History  Procedure Laterality Date  . Adenoidectomy      1990  . Knee surgery      left knee torn cartilage removed 1994  . Hemangioma excision      2009 removal from right ar  . Cholecystectomy     Family History: family history includes Alcohol abuse in her maternal grandfather, paternal grandfather, and paternal grandmother; Cancer in her mother; Diabetes in her maternal aunt, maternal grandmother, and mother; Hyperlipidemia in her mother; Hypertension in her mother; Lung cancer in her maternal grandmother and paternal grandfather; Lymphoma in her mother; Osteoarthritis in her father and mother; Other in her father; Prostate cancer in her maternal grandfather. Social History:  reports that she has never smoked. She has never used smokeless tobacco. She reports that she drinks about 2.4 oz of alcohol per week. She reports that she does not use illicit drugs.   Prenatal Transfer Tool  Maternal  Diabetes: No Genetic Screening: Normal Maternal Ultrasounds/Referrals: Normal Fetal Ultrasounds or other Referrals:  None Maternal Substance Abuse:  No Significant Maternal Medications:  None Significant Maternal Lab Results:  None Other Comments:  None  Review of Systems  Constitutional: Negative.   All other systems reviewed and are negative.     Last menstrual period 05/16/2014. Maternal Exam:  Uterine Assessment: Contraction strength is mild.  Contraction frequency is rare.   Abdomen: Patient reports no abdominal tenderness. Fetal presentation: vertex  Introitus: Normal vulva. Normal vagina.  Ferning test: not done.  Nitrazine test: not done. Amniotic fluid character: not assessed.  Pelvis: adequate for delivery.   Cervix: Cervix evaluated by digital exam.     Physical Exam  Nursing note and vitals reviewed. Constitutional: She is oriented to person, place, and time. She appears well-developed and well-nourished.  HENT:  Head: Normocephalic and atraumatic.  Neck: Normal range of motion. Neck supple.  Cardiovascular: Normal rate and regular rhythm.   Respiratory: Effort normal and breath sounds normal.  GI: Soft. Bowel sounds are normal.  Genitourinary: Vagina normal and uterus normal.  Musculoskeletal: Normal range of motion.  Neurological: She is alert and oriented to person, place, and time. She has normal reflexes.  Skin: Skin is warm and dry.  Psychiatric: She has a normal mood and affect. Her behavior is normal.  Judgment and thought content normal.    Prenatal labs: ABO, Rh: A/Positive/-- (01/11 0000) Antibody: Negative (01/11 0000) Rubella: Immune (01/11 0000) RPR: Nonreactive (01/11 0000)  HBsAg: Negative (01/11 0000)  HIV: Non-reactive (01/11 0000)  GBS: Negative (07/06 0000)   Assessment/Plan: Severe OCD 40 week IUP Proceed with IOL   [name] J 02/21/2015, 5:36 PM

## 2015-02-22 ENCOUNTER — Inpatient Hospital Stay (HOSPITAL_COMMUNITY)
Admission: RE | Admit: 2015-02-22 | Discharge: 2015-02-24 | DRG: 766 | Disposition: A | Discharge status: Home / Self Care | Payer: Managed Care, Other (non HMO) | Source: Ambulatory Visit | Attending: Obstetrics and Gynecology | Admitting: Obstetrics and Gynecology

## 2015-02-22 ENCOUNTER — Inpatient Hospital Stay (HOSPITAL_COMMUNITY): Payer: Managed Care, Other (non HMO) | Admitting: Anesthesiology

## 2015-02-22 ENCOUNTER — Encounter (HOSPITAL_COMMUNITY)
Admission: RE | Disposition: A | Discharge status: Home / Self Care | Payer: Self-pay | Source: Ambulatory Visit | Attending: Obstetrics and Gynecology

## 2015-02-22 ENCOUNTER — Encounter (HOSPITAL_COMMUNITY): Payer: Self-pay

## 2015-02-22 DIAGNOSIS — Z8249 Family history of ischemic heart disease and other diseases of the circulatory system: Secondary | ICD-10-CM | POA: Diagnosis not present

## 2015-02-22 DIAGNOSIS — D509 Iron deficiency anemia, unspecified: Secondary | ICD-10-CM | POA: Diagnosis present

## 2015-02-22 DIAGNOSIS — F419 Anxiety disorder, unspecified: Secondary | ICD-10-CM | POA: Diagnosis present

## 2015-02-22 DIAGNOSIS — F429 Obsessive-compulsive disorder, unspecified: Secondary | ICD-10-CM | POA: Diagnosis present

## 2015-02-22 DIAGNOSIS — O99344 Other mental disorders complicating childbirth: Secondary | ICD-10-CM | POA: Diagnosis present

## 2015-02-22 DIAGNOSIS — K219 Gastro-esophageal reflux disease without esophagitis: Secondary | ICD-10-CM | POA: Diagnosis present

## 2015-02-22 DIAGNOSIS — Z6833 Body mass index (BMI) 33.0-33.9, adult: Secondary | ICD-10-CM

## 2015-02-22 DIAGNOSIS — Z833 Family history of diabetes mellitus: Secondary | ICD-10-CM

## 2015-02-22 DIAGNOSIS — O9962 Diseases of the digestive system complicating childbirth: Secondary | ICD-10-CM | POA: Diagnosis present

## 2015-02-22 DIAGNOSIS — O9081 Anemia of the puerperium: Secondary | ICD-10-CM | POA: Diagnosis present

## 2015-02-22 DIAGNOSIS — Z3A40 40 weeks gestation of pregnancy: Secondary | ICD-10-CM | POA: Diagnosis present

## 2015-02-22 DIAGNOSIS — O9989 Other specified diseases and conditions complicating pregnancy, childbirth and the puerperium: Secondary | ICD-10-CM | POA: Diagnosis present

## 2015-02-22 DIAGNOSIS — F42 Obsessive-compulsive disorder: Secondary | ICD-10-CM | POA: Diagnosis present

## 2015-02-22 HISTORY — DX: Iron deficiency anemia, unspecified: D50.9

## 2015-02-22 LAB — RPR: RPR Ser Ql: NONREACTIVE

## 2015-02-22 LAB — CBC
HCT: 31.6 % — ABNORMAL LOW (ref 36.0–46.0)
Hemoglobin: 10.5 g/dL — ABNORMAL LOW (ref 12.0–15.0)
MCH: 28.9 pg (ref 26.0–34.0)
MCHC: 33.2 g/dL (ref 30.0–36.0)
MCV: 87.1 fL (ref 78.0–100.0)
Platelets: 231 10*3/uL (ref 150–400)
RBC: 3.63 MIL/uL — ABNORMAL LOW (ref 3.87–5.11)
RDW: 15.6 % — AB (ref 11.5–15.5)
WBC: 12.8 10*3/uL — ABNORMAL HIGH (ref 4.0–10.5)

## 2015-02-22 SURGERY — Surgical Case
Anesthesia: Epidural

## 2015-02-22 MED ORDER — LIDOCAINE-EPINEPHRINE (PF) 2 %-1:200000 IJ SOLN
INTRAMUSCULAR | Status: AC
  Filled 2015-02-22: qty 20

## 2015-02-22 MED ORDER — ONDANSETRON HCL 4 MG/2ML IJ SOLN: 4.0000 mg | Freq: Three times a day (TID) | INTRAMUSCULAR | Status: DC | PRN

## 2015-02-22 MED ORDER — FLEET ENEMA 7-19 GM/118ML RE ENEM: 1.0000 | ENEMA | RECTAL | Status: DC | PRN

## 2015-02-22 MED ORDER — BUPIVACAINE HCL (PF) 0.25 % IJ SOLN
INTRAMUSCULAR | Status: AC
  Filled 2015-02-22: qty 20

## 2015-02-22 MED ORDER — FENTANYL 2.5 MCG/ML BUPIVACAINE 1/10 % EPIDURAL INFUSION (WH - ANES)
INTRAMUSCULAR | Status: AC
  Administered 2015-02-22: 14 mL/h via EPIDURAL
  Filled 2015-02-22: qty 125

## 2015-02-22 MED ORDER — OXYTOCIN 40 UNITS IN LACTATED RINGERS INFUSION - SIMPLE MED
1.0000 m[IU]/min | INTRAVENOUS | Status: DC
  Administered 2015-02-22: 2 m[IU]/min via INTRAVENOUS
  Filled 2015-02-22: qty 1000

## 2015-02-22 MED ORDER — ZOLPIDEM TARTRATE 5 MG PO TABS: 5.0000 mg | ORAL_TABLET | Freq: Every evening | ORAL | Status: DC | PRN

## 2015-02-22 MED ORDER — FLUVOXAMINE MALEATE 50 MG PO TABS
150.0000 mg | ORAL_TABLET | Freq: Every day | ORAL | Status: DC
  Filled 2015-02-22 (×2): qty 1

## 2015-02-22 MED ORDER — SODIUM BICARBONATE 8.4 % IV SOLN
INTRAVENOUS | Status: AC
  Filled 2015-02-22: qty 50

## 2015-02-22 MED ORDER — BUPIVACAINE HCL (PF) 0.25 % IJ SOLN
INTRAMUSCULAR | Status: AC
  Filled 2015-02-22: qty 30

## 2015-02-22 MED ORDER — OXYTOCIN 40 UNITS IN LACTATED RINGERS INFUSION - SIMPLE MED: 62.5000 mL/h | INTRAVENOUS | Status: AC

## 2015-02-22 MED ORDER — WITCH HAZEL-GLYCERIN EX PADS: 1.0000 "application " | MEDICATED_PAD | CUTANEOUS | Status: DC | PRN

## 2015-02-22 MED ORDER — OXYTOCIN 40 UNITS IN LACTATED RINGERS INFUSION - SIMPLE MED: 62.5000 mL/h | INTRAVENOUS | Status: DC

## 2015-02-22 MED ORDER — KETOROLAC TROMETHAMINE 30 MG/ML IJ SOLN: 30.0000 mg | Freq: Four times a day (QID) | INTRAMUSCULAR | Status: AC | PRN

## 2015-02-22 MED ORDER — ONDANSETRON HCL 4 MG/2ML IJ SOLN: 4.0000 mg | Freq: Four times a day (QID) | INTRAMUSCULAR | Status: DC | PRN

## 2015-02-22 MED ORDER — ACETAMINOPHEN 325 MG PO TABS: 650.0000 mg | ORAL_TABLET | ORAL | Status: DC | PRN

## 2015-02-22 MED ORDER — OXYTOCIN 10 UNIT/ML IJ SOLN
INTRAMUSCULAR | Status: AC
  Filled 2015-02-22: qty 4

## 2015-02-22 MED ORDER — SODIUM BICARBONATE 8.4 % IV SOLN
INTRAVENOUS | Status: DC | PRN
  Administered 2015-02-22 (×2): 5 mL via EPIDURAL

## 2015-02-22 MED ORDER — LACTATED RINGERS IV SOLN
INTRAVENOUS | Status: DC
  Administered 2015-02-22 (×5): via INTRAVENOUS

## 2015-02-22 MED ORDER — MORPHINE SULFATE 0.5 MG/ML IJ SOLN
INTRAMUSCULAR | Status: AC
  Filled 2015-02-22: qty 100

## 2015-02-22 MED ORDER — FENTANYL 2.5 MCG/ML BUPIVACAINE 1/10 % EPIDURAL INFUSION (WH - ANES): 14.0000 mL/h | INTRAMUSCULAR | Status: DC | PRN

## 2015-02-22 MED ORDER — KETOROLAC TROMETHAMINE 30 MG/ML IJ SOLN
30.0000 mg | Freq: Four times a day (QID) | INTRAMUSCULAR | Status: AC | PRN
  Administered 2015-02-22: 30 mg via INTRAMUSCULAR

## 2015-02-22 MED ORDER — BUPIVACAINE HCL (PF) 0.25 % IJ SOLN
INTRAMUSCULAR | Status: DC | PRN
Start: 2015-02-22 — End: 2015-02-22
  Administered 2015-02-22: 20 mL

## 2015-02-22 MED ORDER — PHENYLEPHRINE 40 MCG/ML (10ML) SYRINGE FOR IV PUSH (FOR BLOOD PRESSURE SUPPORT): 80.0000 ug | PREFILLED_SYRINGE | INTRAVENOUS | Status: DC | PRN

## 2015-02-22 MED ORDER — METHYLERGONOVINE MALEATE 0.2 MG/ML IJ SOLN: 0.2000 mg | INTRAMUSCULAR | Status: DC | PRN

## 2015-02-22 MED ORDER — NALOXONE HCL 0.4 MG/ML IJ SOLN: 0.4000 mg | INTRAMUSCULAR | Status: DC | PRN

## 2015-02-22 MED ORDER — OXYCODONE-ACETAMINOPHEN 5-325 MG PO TABS
1.0000 | ORAL_TABLET | ORAL | Status: DC | PRN
  Administered 2015-02-23 – 2015-02-24 (×2): 1 via ORAL
  Filled 2015-02-22 (×2): qty 1

## 2015-02-22 MED ORDER — TERBUTALINE SULFATE 1 MG/ML IJ SOLN: 0.2500 mg | Freq: Once | INTRAMUSCULAR | Status: DC | PRN

## 2015-02-22 MED ORDER — NALBUPHINE HCL 10 MG/ML IJ SOLN: 5.0000 mg | Freq: Once | INTRAMUSCULAR | Status: AC | PRN

## 2015-02-22 MED ORDER — DIPHENHYDRAMINE HCL 50 MG/ML IJ SOLN: 12.5000 mg | INTRAMUSCULAR | Status: DC | PRN

## 2015-02-22 MED ORDER — KETOROLAC TROMETHAMINE 30 MG/ML IJ SOLN
INTRAMUSCULAR | Status: AC
  Administered 2015-02-22: 30 mg via INTRAMUSCULAR
  Filled 2015-02-22: qty 1

## 2015-02-22 MED ORDER — FENTANYL CITRATE (PF) 100 MCG/2ML IJ SOLN
INTRAMUSCULAR | Status: AC
  Administered 2015-02-22: 25 ug via INTRAVENOUS
  Filled 2015-02-22: qty 2

## 2015-02-22 MED ORDER — CITRIC ACID-SODIUM CITRATE 334-500 MG/5ML PO SOLN
30.0000 mL | ORAL | Status: DC | PRN
  Administered 2015-02-22: 30 mL via ORAL
  Filled 2015-02-22: qty 15

## 2015-02-22 MED ORDER — LANOLIN HYDROUS EX OINT: 1.0000 "application " | TOPICAL_OINTMENT | CUTANEOUS | Status: DC | PRN

## 2015-02-22 MED ORDER — SIMETHICONE 80 MG PO CHEW: 80.0000 mg | CHEWABLE_TABLET | ORAL | Status: DC | PRN

## 2015-02-22 MED ORDER — CEFAZOLIN SODIUM-DEXTROSE 2-3 GM-% IV SOLR
INTRAVENOUS | Status: AC
  Filled 2015-02-22: qty 50

## 2015-02-22 MED ORDER — METHYLERGONOVINE MALEATE 0.2 MG PO TABS: 0.2000 mg | ORAL_TABLET | ORAL | Status: DC | PRN

## 2015-02-22 MED ORDER — SODIUM CHLORIDE 0.9 % IJ SOLN: 3.0000 mL | INTRAMUSCULAR | Status: DC | PRN

## 2015-02-22 MED ORDER — PHENYLEPHRINE 40 MCG/ML (10ML) SYRINGE FOR IV PUSH (FOR BLOOD PRESSURE SUPPORT)
PREFILLED_SYRINGE | INTRAVENOUS | Status: AC
  Filled 2015-02-22: qty 10

## 2015-02-22 MED ORDER — NALBUPHINE HCL 10 MG/ML IJ SOLN
5.0000 mg | Freq: Once | INTRAMUSCULAR | Status: AC | PRN
  Administered 2015-02-23: 5 mg via INTRAVENOUS

## 2015-02-22 MED ORDER — DIPHENHYDRAMINE HCL 25 MG PO CAPS
25.0000 mg | ORAL_CAPSULE | ORAL | Status: DC | PRN
Start: 2015-02-22 — End: 2015-02-24
  Administered 2015-02-23: 25 mg via ORAL

## 2015-02-22 MED ORDER — SODIUM CHLORIDE 0.9 % IJ SOLN
INTRAMUSCULAR | Status: AC
  Filled 2015-02-22: qty 20

## 2015-02-22 MED ORDER — MISOPROSTOL 25 MCG QUARTER TABLET
25.0000 ug | ORAL_TABLET | ORAL | Status: DC | PRN
  Administered 2015-02-22: 25 ug via VAGINAL
  Filled 2015-02-22: qty 0.25

## 2015-02-22 MED ORDER — LACTATED RINGERS IV SOLN
INTRAVENOUS | Status: DC | PRN
  Administered 2015-02-22 (×2): via INTRAVENOUS

## 2015-02-22 MED ORDER — OXYTOCIN BOLUS FROM INFUSION: 500.0000 mL | INTRAVENOUS | Status: DC

## 2015-02-22 MED ORDER — MENTHOL 3 MG MT LOZG: 1.0000 | LOZENGE | OROMUCOSAL | Status: DC | PRN

## 2015-02-22 MED ORDER — IBUPROFEN 600 MG PO TABS: 600.0000 mg | ORAL_TABLET | Freq: Four times a day (QID) | ORAL | Status: DC | PRN

## 2015-02-22 MED ORDER — OXYTOCIN 10 UNIT/ML IJ SOLN
40.0000 [IU] | INTRAVENOUS | Status: DC | PRN
  Administered 2015-02-22: 40 [IU] via INTRAVENOUS

## 2015-02-22 MED ORDER — MEPERIDINE HCL 25 MG/ML IJ SOLN: 6.2500 mg | INTRAMUSCULAR | Status: DC | PRN

## 2015-02-22 MED ORDER — LIDOCAINE HCL (PF) 1 % IJ SOLN: 30.0000 mL | INTRAMUSCULAR | Status: DC | PRN

## 2015-02-22 MED ORDER — SIMETHICONE 80 MG PO CHEW
80.0000 mg | CHEWABLE_TABLET | ORAL | Status: DC
  Administered 2015-02-22 – 2015-02-23 (×2): 80 mg via ORAL
  Filled 2015-02-22 (×2): qty 1

## 2015-02-22 MED ORDER — DIPHENHYDRAMINE HCL 25 MG PO CAPS
25.0000 mg | ORAL_CAPSULE | Freq: Four times a day (QID) | ORAL | Status: DC | PRN
  Administered 2015-02-23: 25 mg via ORAL
  Filled 2015-02-22 (×2): qty 1

## 2015-02-22 MED ORDER — ONDANSETRON HCL 4 MG/2ML IJ SOLN
INTRAMUSCULAR | Status: DC | PRN
  Administered 2015-02-22: 4 mg via INTRAVENOUS

## 2015-02-22 MED ORDER — LIDOCAINE HCL (PF) 1 % IJ SOLN
INTRAMUSCULAR | Status: DC | PRN
  Administered 2015-02-22 (×2): 4 mL via EPIDURAL

## 2015-02-22 MED ORDER — LACTATED RINGERS IV SOLN
INTRAVENOUS | Status: DC
  Administered 2015-02-22: 08:00:00 via INTRAUTERINE

## 2015-02-22 MED ORDER — IBUPROFEN 600 MG PO TABS
600.0000 mg | ORAL_TABLET | Freq: Four times a day (QID) | ORAL | Status: DC
  Administered 2015-02-22 – 2015-02-24 (×7): 600 mg via ORAL
  Filled 2015-02-22 (×7): qty 1

## 2015-02-22 MED ORDER — CEFAZOLIN SODIUM-DEXTROSE 2-3 GM-% IV SOLR
2.0000 g | INTRAVENOUS | Status: DC
  Filled 2015-02-22: qty 50

## 2015-02-22 MED ORDER — FENTANYL CITRATE (PF) 100 MCG/2ML IJ SOLN
25.0000 ug | INTRAMUSCULAR | Status: DC | PRN
  Administered 2015-02-22: 25 ug via INTRAVENOUS
  Administered 2015-02-22: 50 ug via INTRAVENOUS
  Administered 2015-02-22: 25 ug via INTRAVENOUS

## 2015-02-22 MED ORDER — FLUVOXAMINE MALEATE ER 100 MG PO CP24: 100.0000 mg | ORAL_CAPSULE | Freq: Two times a day (BID) | ORAL | Status: DC

## 2015-02-22 MED ORDER — FLUVOXAMINE MALEATE 50 MG PO TABS
150.0000 mg | ORAL_TABLET | Freq: Every day | ORAL | Status: DC
  Administered 2015-02-23 – 2015-02-24 (×2): 150 mg via ORAL
  Filled 2015-02-22 (×4): qty 1

## 2015-02-22 MED ORDER — FLUVOXAMINE MALEATE 100 MG PO TABS
100.0000 mg | ORAL_TABLET | Freq: Every day | ORAL | Status: DC
  Administered 2015-02-22 – 2015-02-23 (×2): 100 mg via ORAL
  Filled 2015-02-22 (×2): qty 1

## 2015-02-22 MED ORDER — SCOPOLAMINE 1 MG/3DAYS TD PT72
MEDICATED_PATCH | TRANSDERMAL | Status: DC | PRN
  Administered 2015-02-22: 1.5 mg via TRANSDERMAL

## 2015-02-22 MED ORDER — ONDANSETRON HCL 4 MG/2ML IJ SOLN
INTRAMUSCULAR | Status: AC
  Filled 2015-02-22: qty 2

## 2015-02-22 MED ORDER — FENTANYL 2.5 MCG/ML BUPIVACAINE 1/10 % EPIDURAL INFUSION (WH - ANES)
14.0000 mL/h | INTRAMUSCULAR | Status: DC | PRN
  Administered 2015-02-22 (×2): 14 mL/h via EPIDURAL

## 2015-02-22 MED ORDER — CEFAZOLIN SODIUM-DEXTROSE 2-3 GM-% IV SOLR
INTRAVENOUS | Status: DC | PRN
  Administered 2015-02-22: 2 g via INTRAVENOUS

## 2015-02-22 MED ORDER — BUPIVACAINE LIPOSOME 1.3 % IJ SUSP
20.0000 mL | Freq: Once | INTRAMUSCULAR | Status: AC
  Administered 2015-02-22: 20 mL
  Filled 2015-02-22: qty 20

## 2015-02-22 MED ORDER — PRENATAL MULTIVITAMIN CH
1.0000 | ORAL_TABLET | Freq: Every day | ORAL | Status: DC
  Administered 2015-02-23 – 2015-02-24 (×2): 1 via ORAL
  Filled 2015-02-22 (×2): qty 1

## 2015-02-22 MED ORDER — DEXTROSE 5 % IV SOLN
1.0000 ug/kg/h | INTRAVENOUS | Status: DC | PRN
  Filled 2015-02-22: qty 2

## 2015-02-22 MED ORDER — MORPHINE SULFATE (PF) 0.5 MG/ML IJ SOLN
INTRAMUSCULAR | Status: DC | PRN
  Administered 2015-02-22: 3 mg via EPIDURAL

## 2015-02-22 MED ORDER — SENNOSIDES-DOCUSATE SODIUM 8.6-50 MG PO TABS
2.0000 | ORAL_TABLET | ORAL | Status: DC
  Administered 2015-02-22 – 2015-02-23 (×2): 2 via ORAL
  Filled 2015-02-22 (×2): qty 2

## 2015-02-22 MED ORDER — TETANUS-DIPHTH-ACELL PERTUSSIS 5-2.5-18.5 LF-MCG/0.5 IM SUSP: 0.5000 mL | Freq: Once | INTRAMUSCULAR | Status: DC

## 2015-02-22 MED ORDER — NALBUPHINE HCL 10 MG/ML IJ SOLN
5.0000 mg | INTRAMUSCULAR | Status: DC | PRN
  Administered 2015-02-22: 5 mg via INTRAVENOUS
  Filled 2015-02-22: qty 1

## 2015-02-22 MED ORDER — OXYCODONE-ACETAMINOPHEN 5-325 MG PO TABS: 1.0000 | ORAL_TABLET | ORAL | Status: DC | PRN

## 2015-02-22 MED ORDER — LACTATED RINGERS IV SOLN
INTRAVENOUS | Status: DC
  Administered 2015-02-22: 23:00:00 via INTRAVENOUS

## 2015-02-22 MED ORDER — LACTATED RINGERS IV SOLN: 500.0000 mL | INTRAVENOUS | Status: DC | PRN

## 2015-02-22 MED ORDER — DIBUCAINE 1 % RE OINT: 1.0000 "application " | TOPICAL_OINTMENT | RECTAL | Status: DC | PRN

## 2015-02-22 MED ORDER — OXYCODONE-ACETAMINOPHEN 5-325 MG PO TABS: 2.0000 | ORAL_TABLET | ORAL | Status: DC | PRN

## 2015-02-22 MED ORDER — SIMETHICONE 80 MG PO CHEW
80.0000 mg | CHEWABLE_TABLET | Freq: Three times a day (TID) | ORAL | Status: DC
  Administered 2015-02-22 – 2015-02-24 (×5): 80 mg via ORAL
  Filled 2015-02-22 (×5): qty 1

## 2015-02-22 MED ORDER — SCOPOLAMINE 1 MG/3DAYS TD PT72
1.0000 | MEDICATED_PATCH | Freq: Once | TRANSDERMAL | Status: DC
  Filled 2015-02-22: qty 1

## 2015-02-22 MED ORDER — NALBUPHINE HCL 10 MG/ML IJ SOLN
5.0000 mg | INTRAMUSCULAR | Status: DC | PRN
  Filled 2015-02-22: qty 1

## 2015-02-22 MED ORDER — EPHEDRINE 5 MG/ML INJ: 10.0000 mg | INTRAVENOUS | Status: DC | PRN

## 2015-02-22 SURGICAL SUPPLY — 37 items
APL SKNCLS STERI-STRIP NONHPOA (GAUZE/BANDAGES/DRESSINGS) ×1
BENZOIN TINCTURE PRP APPL 2/3 (GAUZE/BANDAGES/DRESSINGS) ×2 IMPLANT
CLAMP CORD UMBIL (MISCELLANEOUS) IMPLANT
CLOSURE WOUND 1/2 X4 (GAUZE/BANDAGES/DRESSINGS) ×1
CLOTH BEACON ORANGE TIMEOUT ST (SAFETY) ×3 IMPLANT
CONTAINER PREFILL 10% NBF 15ML (MISCELLANEOUS) IMPLANT
DRAPE SHEET LG 3/4 BI-LAMINATE (DRAPES) IMPLANT
DRSG OPSITE POSTOP 4X10 (GAUZE/BANDAGES/DRESSINGS) ×3 IMPLANT
DURAPREP 26ML APPLICATOR (WOUND CARE) ×3 IMPLANT
ELECT REM PT RETURN 9FT ADLT (ELECTROSURGICAL) ×3
ELECTRODE REM PT RTRN 9FT ADLT (ELECTROSURGICAL) ×1 IMPLANT
EXTRACTOR VACUUM M CUP 4 TUBE (SUCTIONS) IMPLANT
EXTRACTOR VACUUM M CUP 4' TUBE (SUCTIONS)
GLOVE BIO SURGEON STRL SZ7.5 (GLOVE) ×3 IMPLANT
GOWN STRL REUS W/TWL LRG LVL3 (GOWN DISPOSABLE) ×6 IMPLANT
KIT ABG SYR 3ML LUER SLIP (SYRINGE) IMPLANT
NDL HYPO 25X5/8 SAFETYGLIDE (NEEDLE) IMPLANT
NDL SPNL 20GX3.5 QUINCKE YW (NEEDLE) IMPLANT
NEEDLE HYPO 22GX1.5 SAFETY (NEEDLE) ×3 IMPLANT
NEEDLE HYPO 25X5/8 SAFETYGLIDE (NEEDLE) IMPLANT
NEEDLE SPNL 20GX3.5 QUINCKE YW (NEEDLE) IMPLANT
NS IRRIG 1000ML POUR BTL (IV SOLUTION) ×3 IMPLANT
PACK C SECTION WH (CUSTOM PROCEDURE TRAY) ×3 IMPLANT
STRIP CLOSURE SKIN 1/2X4 (GAUZE/BANDAGES/DRESSINGS) ×1 IMPLANT
SUT MNCRL 0 VIOLET CTX 36 (SUTURE) ×2 IMPLANT
SUT MNCRL AB 3-0 PS2 27 (SUTURE) ×2 IMPLANT
SUT MON AB 2-0 CT1 27 (SUTURE) ×3 IMPLANT
SUT MON AB-0 CT1 36 (SUTURE) ×6 IMPLANT
SUT MONOCRYL 0 CTX 36 (SUTURE) ×4
SUT PLAIN 0 NONE (SUTURE) IMPLANT
SUT PLAIN 2 0 (SUTURE)
SUT PLAIN 2 0 XLH (SUTURE) ×2 IMPLANT
SUT PLAIN ABS 2-0 CT1 27XMFL (SUTURE) IMPLANT
SYR 20CC LL (SYRINGE) IMPLANT
SYR CONTROL 10ML LL (SYRINGE) ×3 IMPLANT
TOWEL OR 17X24 6PK STRL BLUE (TOWEL DISPOSABLE) ×3 IMPLANT
TRAY FOLEY CATH SILVER 14FR (SET/KITS/TRAYS/PACK) ×3 IMPLANT

## 2015-02-22 NOTE — Plan of Care (Signed)
Problem: Consults Goal: Birthing Suites Patient Information Press F2 to bring up selections list Outcome: Completed/Met Date Met:  02/22/15  Pt > [redacted] weeks EGA and Inpatient induction

## 2015-02-22 NOTE — Progress Notes (Signed)
Katie [name] is a 34 y.o. G1P0 at [redacted]w[redacted]d by LMP admitted for induction of labor due to psych indications.  Subjective: comfortable  Objective: BP 120/74 mmHg  Pulse 88  Temp(Src) 98.2 F (36.8 C) (Oral)  Resp 20  Ht 5\' 4"  (1.626 m)  Wt 89.359 kg (197 lb)  BMI 33.80 kg/m2  LMP 05/16/2014 (Approximate)      FHT:  FHR: 145 bpm, variability: minimal ,  accelerations:  Abscent,  decelerations:  Absent UC:   regular, every 2 minutes SVE:   Dilation: 3.5 Effacement (%): 60 Station: -2 Exam by:: TWillis RNC  Labs: Lab Results  Component Value Date   WBC 12.8* 02/22/2015   HGB 10.5* 02/22/2015   HCT 31.6* 02/22/2015   MCV 87.1 02/22/2015   PLT 231 02/22/2015    Assessment / Plan: IOL due to psych indications Recurrent late decels  Labor: no progress Preeclampsia:  no signs or symptoms of toxicity Fetal Wellbeing:  Category III Pain Control:  Epidural I/D:  n/a Anticipated MOD:  proceed with csection. Risks vs benefits discussed.   Heran Campau J 02/22/2015, 12:37 PM

## 2015-02-22 NOTE — Lactation Note (Signed)
This note was copied from the chart of [place]. Lactation Consultation Note Initial visit at 9 hours of age.  Baby is asleep with mom after a recent good feeding.  Mom denies pain and concerns at this time.  Mom has flat nipples.  Right nipple more flat with unclear separation of nipple and areola complex.  Nipple appears slightly bifurcated with irregular shape.  [facility] LC resources given and discussed.  Encouraged to feed with early cues on demand.  Early newborn behavior discussed.  Hand expression demonstrated with colostrum visible.  Mom to call for assist as needed.     Patient Name: Katie [name] [id number] Date: 02/22/2015 Reason for consult: Initial assessment   Maternal Data Has patient been taught Hand Expression?: Yes Does the patient have breastfeeding experience prior to this delivery?: No  Feeding Feeding Type: Breast Fed Length of feed: 20 min  LATCH Score/Interventions                      Lactation Tools Discussed/Used Pump Review: Setup, frequency, and cleaning Initiated by:: JS Date initiated:: 02/23/15   Consult Status Consult Status: Follow-up Date: 02/23/15 Follow-up type: In-patient    Kendell Bane Justine Null 02/22/2015, 10:36 PM

## 2015-02-22 NOTE — Op Note (Signed)
Cesarean Section Procedure Note  Indications: non-reassuring fetal status  Pre-operative Diagnosis: 40 week 1 day pregnancy.  Post-operative Diagnosis: same  Surgeon: Lovenia Kim   Assistants: Renato Battles, CNM  Anesthesia: Epidural anesthesia and Local anesthesia 0.25.% bupivacaine  ASA Class: 2  Procedure Details  The patient was seen in the Holding Room. The risks, benefits, complications, treatment options, and expected outcomes were discussed with the patient.  The patient concurred with the proposed plan, giving informed consent. The risks of anesthesia, infection, bleeding and possible injury to other organs discussed. Injury to bowel, bladder, or ureter with possible need for repair discussed. Possible need for transfusion with secondary risks of hepatitis or HIV acquisition discussed. Post operative complications to include but not limited to DVT, PE and Pneumonia noted. The site of surgery properly noted/marked. The patient was taken to Operating Room # 1, identified as Katie [name] and the procedure verified as C-Section Delivery. A Time Out was held and the above information confirmed.  After induction of anesthesia, the patient was draped and prepped in the usual sterile manner. A Pfannenstiel incision was made and carried down through the subcutaneous tissue to the fascia. Fascial incision was made and extended transversely using Mayo scissors. The fascia was separated from the underlying rectus tissue superiorly and inferiorly. The peritoneum was identified and entered. Peritoneal incision was extended longitudinally. The utero-vesical peritoneal reflection was incised transversely and the bladder flap was bluntly freed from the lower uterine segment. A low transverse uterine incision(Kerr hysterotomy) was made. Delivered from OA presentation was a  female with Apgar scores of 8 at one minute and 8 at five minutes. Bulb suctioning gently performed. Neonatal team in attendance.After  the umbilical cord was clamped and cut cord blood was obtained for evaluation. The placenta was removed intact and appeared normal. The uterus was curetted with a dry lap pack. Good hemostasis was noted.The uterine outline, tubes and ovaries appeared normal. The uterine incision was closed with running locked sutures of 0 Monocryl x 2 layers. Hemostasis was observed. Lavage was carried out until clear.The parietal peritoneum was closed with a running 2-0 Monocryl suture. The fascia was then reapproximated with running sutures of 0 Monocryl. The skin was reapproximated with 3-0 monocryl after [place] closure with 2-0 plain.  Instrument, sponge, and needle counts were correct prior the abdominal closure and at the conclusion of the case.   Findings: FTLM, 8/8  Estimated Blood Loss:  450         Drains: foley                 Specimens: placenta                 Complications:  None; patient tolerated the procedure well.         Disposition: PACU - hemodynamically stable.         Condition: stable  Attending Attestation: I performed the procedure.

## 2015-02-22 NOTE — Anesthesia Preprocedure Evaluation (Addendum)
Anesthesia Evaluation  Patient identified by MRN, date of birth, ID band Patient awake    Reviewed: Allergy & Precautions, Patient's Chart, lab work & pertinent test results  Airway Mallampati: III  TM Distance: >3 FB Neck ROM: Full    Dental no notable dental hx. (+) Teeth Intact   Pulmonary asthma ,  breath sounds clear to auscultation  Pulmonary exam normal       Cardiovascular negative cardio ROS Normal cardiovascular examRhythm:Regular Rate:Normal     Neuro/Psych  Headaches, PSYCHIATRIC DISORDERS Anxiety Depression Bipolar Disorder  Neuromuscular disease    GI/Hepatic Neg liver ROS, GERD-  Medicated and Controlled,  Endo/Other  Obesity  Renal/GU negative Renal ROS  negative genitourinary   Musculoskeletal  (+) Fibromyalgia -, narcotic dependent  Abdominal (+) + obese,   Peds  Hematology  (+) anemia ,   Anesthesia Other Findings   Reproductive/Obstetrics (+) Pregnancy                             Anesthesia Physical Anesthesia Plan  ASA: II  Anesthesia Plan: Epidural   Post-op Pain Management:    Induction:   Airway Management Planned: Natural Airway  Additional Equipment:   Intra-op Plan:   Post-operative Plan:   Informed Consent: I have reviewed the patients History and Physical, chart, labs and discussed the procedure including the risks, benefits and alternatives for the proposed anesthesia with the patient or authorized representative who has indicated his/her understanding and acceptance.     Plan Discussed with: Anesthesiologist, CRNA and Surgeon  Anesthesia Plan Comments: (Patient for urgent C/Section for non reassuring FHR tracing. Will use epidural for C/Section.)       Anesthesia Quick Evaluation

## 2015-02-22 NOTE — Transfer of Care (Signed)
Immediate Anesthesia Transfer of Care Note  Patient: Katie [name]  Procedure(s) Performed: Procedure(s): CESAREAN SECTION (N/A)  Patient Location: PACU  Anesthesia Type:Epidural  Level of Consciousness: awake, alert , oriented and patient cooperative  Airway & Oxygen Therapy: Patient Spontanous Breathing  Post-op Assessment: Report given to RN and Post -op Vital signs reviewed and stable  Post vital signs: Reviewed and stable  Last Vitals:  TEMP 98.6 BP 119/67 HR 94 RR 22 )2SAT 97  Complications: No apparent anesthesia complications

## 2015-02-22 NOTE — Anesthesia Procedure Notes (Signed)
Epidural Patient location during procedure: OB Start time: 02/22/2015 7:55 AM  Staffing Anesthesiologist: Josephine Igo Performed by: anesthesiologist   Preanesthetic Checklist Completed: patient identified, site marked, surgical consent, pre-op evaluation, timeout performed, IV checked, risks and benefits discussed and monitors and equipment checked  Epidural Patient position: sitting Prep: site prepped and draped and DuraPrep Patient monitoring: continuous pulse ox and blood pressure Approach: midline Location: L3-L4 Injection technique: LOR air  Needle:  Needle type: Tuohy  Needle gauge: 17 G Needle length: 9 cm and 9 Needle insertion depth: 6 cm Catheter type: closed end flexible Catheter size: 19 Gauge Catheter at skin depth: 10 cm Test dose: negative and Other  Assessment Events: blood not aspirated, injection not painful, no injection resistance, negative IV test and no paresthesia  Additional Notes Patient identified. Risks and benefits discussed including failed block, incomplete  Pain control, post dural puncture headache, nerve damage, paralysis, blood pressure Changes, nausea, vomiting, reactions to medications-both toxic and allergic and post Partum back pain. All questions were answered. Patient expressed understanding and wished to proceed. Sterile technique was used throughout procedure. Epidural site was Dressed with sterile barrier dressing. No paresthesias, signs of intravascular injection Or signs of intrathecal spread were encountered.  Patient was more comfortable after the epidural was dosed. Please see RN's note for documentation of vital signs and FHR which are stable.

## 2015-02-22 NOTE — Anesthesia Postprocedure Evaluation (Signed)
  Anesthesia Post-op Note  Patient: Katie [name]  Procedure(s) Performed: Procedure(s): CESAREAN SECTION (N/A)  Patient Location: PACU  Anesthesia Type:Epidural  Level of Consciousness: awake, alert  and oriented  Airway and Oxygen Therapy: Patient Spontanous Breathing  Post-op Pain: none  Post-op Assessment: Post-op Vital signs reviewed, Patient's Cardiovascular Status Stable, Respiratory Function Stable, Patent Airway, No signs of Nausea or vomiting, Pain level controlled, No headache, No backache, Spinal receding and Patient able to bend at knees LLE Motor Response: Purposeful movement LLE Sensation: Tingling RLE Motor Response: Purposeful movement RLE Sensation: Tingling L Sensory Level: T11 R Sensory Level: T11  Post-op Vital Signs: Reviewed and stable  Last Vitals:  Filed Vitals:   02/22/15 1415  BP: 120/96  Pulse: 94  Temp:   Resp: 20    Complications: No apparent anesthesia complications

## 2015-02-23 ENCOUNTER — Encounter (HOSPITAL_COMMUNITY): Payer: Self-pay

## 2015-02-23 DIAGNOSIS — D509 Iron deficiency anemia, unspecified: Secondary | ICD-10-CM

## 2015-02-23 HISTORY — DX: Iron deficiency anemia, unspecified: D50.9

## 2015-02-23 LAB — CBC
HEMATOCRIT: 31.8 % — AB (ref 36.0–46.0)
Hemoglobin: 10.4 g/dL — ABNORMAL LOW (ref 12.0–15.0)
MCH: 28.8 pg (ref 26.0–34.0)
MCHC: 32.7 g/dL (ref 30.0–36.0)
MCV: 88.1 fL (ref 78.0–100.0)
Platelets: 211 10*3/uL (ref 150–400)
RBC: 3.61 MIL/uL — ABNORMAL LOW (ref 3.87–5.11)
RDW: 15.8 % — ABNORMAL HIGH (ref 11.5–15.5)
WBC: 13.2 10*3/uL — ABNORMAL HIGH (ref 4.0–10.5)

## 2015-02-23 NOTE — Addendum Note (Signed)
Addendum  created 02/23/15 0948 by Garner Nash, CRNA   Modules edited: Notes Section   Notes Section:  File: [id number]

## 2015-02-23 NOTE — Progress Notes (Signed)
Patient ID: JAQUILA SANTELLI, female   DOB: 02-Nov-1980, 34 y.o.   MRN: [id number] Subjective: S/P Primary Cesarean Delivery d/t Fetal Intolerance to Labor POD# 1 Information for the patient's newborn:  Arkie, Tagliaferro [[id number]]  female  / circ planning  Reports feeling tired and sore Feeding: breast Patient reports tolerating PO.  Breast symptoms: none Pain controlled with ibuprofen (OTC) and narcotic analgesics including Percocet Denies HA/SOB/C/P/N/V/dizziness. Flatus present. No BM. She reports vaginal bleeding as normal, without clots.  She is ambulating, urinating without difficult.     Objective:   VS:  Filed Vitals:   02/22/15 1830 02/22/15 2235 02/23/15 0203 02/23/15 0609  BP: 125/73 117/76 113/64 120/67  Pulse: 85 89 92 91  Temp: 98.2 F (36.8 C) 98.4 F (36.9 C) 98.5 F (36.9 C) 98.5 F (36.9 C)  TempSrc: Oral Oral Oral Oral  Resp: 18 18 18 18   Height:      Weight:      SpO2: 97% 97% 96%      Intake/Output Summary (Last 24 hours) at 02/23/15 0849 Last data filed at 02/23/15 0656  Gross per 24 hour  Intake 1990.83 ml  Output   3950 ml  Net -1959.17 ml        Recent Labs  02/22/15 0110 02/23/15 0518  WBC 12.8* 13.2*  HGB 10.5* 10.4*  HCT 31.6* 31.8*  PLT 231 211     Blood type: A/Positive/-- (01/11 0000)  Rubella: Immune (01/11 0000)     Physical Exam:  General: alert, cooperative, fatigued and no distress CV: Regular rate and rhythm, S1S2 present or without murmur or extra heart sounds Resp: clear Abdomen: soft, nontender, normal bowel sounds Incision: clean, dry and intact / skin well-approximated with sutures Uterine Fundus: firm, U-even, nontender Lochia: minimal Ext: extremities normal, atraumatic, no cyanosis or edema, Homans sign is negative, no sign of DVT and no edema, redness or tenderness in the calves or thighs   Assessment/Plan: 34 y.o.   POD# 1.  s/p Cesarean Delivery.  Indications: Fetal Intolerance to Labor                 Principal Problem:   Postpartum care following cesarean delivery (8/11) Active Problems:   Obsessive compulsive disorder   Iron deficiency anemia  Doing well, stable.               Regular diet as tolerated Ambulate Routine post-op care  Graceann Congress, MSN, CNM 02/23/2015, 8:49 AM

## 2015-02-23 NOTE — Anesthesia Postprocedure Evaluation (Signed)
  Anesthesia Post-op Note  Patient: Katie [name]  Procedure(s) Performed: Procedure(s): CESAREAN SECTION (N/A)  Patient Location: Mother/Baby  Anesthesia Type:Epidural  Level of Consciousness: awake and alert   Airway and Oxygen Therapy: Patient Spontanous Breathing  Post-op Pain: mild  Post-op Assessment: Post-op Vital signs reviewed, Patient's Cardiovascular Status Stable, Respiratory Function Stable, No signs of Nausea or vomiting, Pain level controlled, No headache, Spinal receding and Patient able to bend at knees  Post-op Vital Signs: Reviewed  Last Vitals:  Filed Vitals:   02/23/15 0609  BP: 120/67  Pulse: 91  Temp: 36.9 C  Resp: 18    Complications: No apparent anesthesia complications

## 2015-02-23 NOTE — Clinical Social Work Maternal (Signed)
CLINICAL SOCIAL WORK MATERNAL/CHILD NOTE  Patient Details  Name: Katie [name] MRN: [id number] Date of Birth: 11-Oct-1980  Date:  02/23/2015  Clinical Social Worker Initiating Note:  Lucita Ferrara, LCSW Date/ Time Initiated:  02/23/15/1200     Child's Name:  Katie [name]   Legal Guardian: Judson Roch and Bartholomew Crews, parents  Need for Interpreter:  None   Date of Referral:  02/22/15     Reason for Referral:  Behavioral Health Issues  Referral Source:  [facility]   Address:  [place], [place] [address]  Phone number:  [phone]   Household Members:  Spouse   Natural Supports (not living in the home):  Immediate Family   Professional Supports: Psychiatrist, Dr. Evangeline Gula at [facility]  Employment:   Did not assess  Type of Work:   N/A  Education:    N/A  Pensions consultant:  Multimedia programmer   Other Resources:    None identified  Cultural/Religious Considerations Which May Impact Care:  None reported  Strengths:  Ability to meet basic needs , Compliance with medical plan , Home prepared for child , Pediatrician chosen    Risk Factors/Current Problems:   1)Mental Health Concerns: MOB presents with history of anxiety, depression, OCD, and bipolar. MOB is currently receiving medication management from Dr. Evangeline Gula at [facility], and is prescribed Luvox to assist with management of medications. MOB shared that she, her support system, and her care providers will closely monitor her mood postpartum due to increased risk for developing perinatal mood and anxiety disorders.  Cognitive State:  Able to Concentrate , Alert , Goal Oriented , Linear Thinking , Insightful    Mood/Affect:  Interested , Happy , Comfortable , Calm    CSW Assessment:  CSW received request for consult due to MOB presenting with a history of anxiety, depression, OCD, and bipolar.  MOB presented as easily engaged and receptive to the visit. She was noted to be in a pleasant mood, and displayed a  full range in affect.  MOB was holding and caring for the infant during the visit, no mental health symptoms observed.  MOB presents with insight related to her mental health needs as she transitions to the postpartum period, and appears to have self-awareness related to her current thoughts and feelings.   MOB confirmed prior history of bipolar, anxiety, depression, and OCD.  Per MOB, her psychiatrist, Dr. Evangeline Gula at [facility] has stated that bipolar is no longer an accurate diagnosis.  She shared that anxiety, depression, and OCD are her current diagnosis.  MOB openly discussed worsening symptoms of anxiety during the pregnancy, and shared that she has been closely followed by her psychiatrist. MOB stated that she is currently prescribed Luvox and Vistaril, and symptoms have been well controlled once Luvox was increased during the pregnancy.  She discussed concern about her increased risk for developing postpartum depression due to her mental health history and her symptoms during the pregnancy, and she shared that her care team has already discussed with her the need to closely monitor her mood.  MOB shared that she has been instructed to schedule a follow up visit as soon as possible postpartum.  MOB discussed having a strong support who is also able to assist her to monitor her mood and behaviors, and she expressed belief that her family will confront her if there are any notable behaviors.  CSW reviewed signs and symptoms with MOB, including the differences between the [employer] and symptoms of more notable concern.  Overall, MOB expressed feelings of excitement and happiness secondary to becoming a mother. She shared that the FOB appears nervous and anxious related to providing infant care. MOB discussed normative anxieties related to having an unplanned C-section and due to NICU team being present at birth; however, she shared that she is slowly adjusting and feeling better.  MOB presents with insight  related to the importance of getting sleep, but also discussed how it has been difficult for her to sleep since she worries that something bad will happen to the infant if she is not watching him.  She discussed how her mother will be spending time at her home at discharge in order to provide her with additional support while she recovers from her C-section and cares for the infant.  CSW Plan/Description:   1)Patient/Family Education: Perinatal mood and anxiety disorders.  2) MOB will continue to work closely with mental health providers at [facility]. She presents as motivated and aware of importance of caring her mental health postpartum. 3)No Further Intervention Required/No Barriers to Discharge    Valery, Amedee 02/23/2015, 12:22 PM

## 2015-02-24 MED ORDER — FLUVOXAMINE MALEATE 50 MG PO TABS: 150.0000 mg | ORAL_TABLET | Freq: Every day | ORAL | Status: DC

## 2015-02-24 MED ORDER — OXYCODONE-ACETAMINOPHEN 5-325 MG PO TABS: 1.0000 | ORAL_TABLET | ORAL | Status: DC | PRN

## 2015-02-24 MED ORDER — IBUPROFEN 600 MG PO TABS: 600.0000 mg | ORAL_TABLET | Freq: Four times a day (QID) | ORAL | Status: DC

## 2015-02-24 NOTE — Lactation Note (Signed)
This note was copied from the chart of [facility]. Lactation Consultation Note  Patient Name: Katie [name] WGNFA'O Date: 02/24/2015 Reason for consult: Follow-up assessment   With this mom of a term baby, now 58 hours old, and asleep in mom's arms at this time. Mom reports breast feeding going well. On exam, mom has a bifurcated nipple on the right - colostrum expressed frm both parts of nipple. Mom said the baby can latch to this nipple, but it takes a little bit longer than mom's left breast. Mom's breasts a re full, soft, with easily expressed colostrum. Mom knows to call for questions/concerns.    Maternal Data    Feeding    LATCH Score/Interventions                      Lactation Tools Discussed/Used     Consult Status Consult Status: Follow-up Date: 02/25/15 Follow-up type: In-patient    Tonna Corner 02/24/2015, 2:10 PM

## 2015-02-24 NOTE — Discharge Summary (Signed)
POSTOPERATIVE DISCHARGE SUMMARY:  Patient ID: Katie [name] MRN: [id number] DOB/AGE: [birth year] 34 y.o.  Admit date: 02/22/2015 Admission Diagnoses: 40.1 weeks / bipolar disorder with worsening OCD  Discharge date:  02/24/2015 Discharge Diagnoses: POD 2 CS due to [facility] / bipolar disorder with OCD - stable   Prenatal history: G1P1001   EDC : 02/21/2015, Alternate EDD Entry  Prenatal care at [facility]  Primary provider : [facility] Prenatal course complicated by bipolar disorder with OCD - increased symptoms OCD in pregnancy (Luvox increased to 100mg  and now 150 mg postpartum) managed by Dr Evangeline Gula  Prenatal Labs: ABO, Rh: A/Positive/-- (01/11 0000)  Antibody: Negative (01/11 0000) Rubella: Immune (01/11 0000)   RPR: Non Reactive (08/11 0110)  HBsAg: Negative (01/11 0000)  HIV: Non-reactive (01/11 0000)  GTT : normal GBS: Negative (07/06 0000)   Medical / Surgical History :  Past medical history:  Past Medical History  Diagnosis Date  . Asthma   . Bipolar affective disorder     onogoing Dr Nicholaus Corolla treatment for the past 10 years  . GERD (gastroesophageal reflux disease)   . Allergy   . Migraines     teenage onset with periods  . History of recurrent UTIs     cystoscopy with Dr Barnie Del [facility]  . History of kidney stones     highschool  . Depression   . Chronic fatigue disorder   . Anxiety     currently on luvox, hydroxyzine  . Postpartum care following cesarean delivery (8/11) 02/22/2015  . Iron deficiency anemia 02/23/2015    Past surgical history:  Past Surgical History  Procedure Laterality Date  . Adenoidectomy      1990  . Knee surgery      left knee torn cartilage removed 1994  . Hemangioma excision      2009 removal from right ar  . Cholecystectomy    . Cesarean section N/A 02/22/2015    Procedure: CESAREAN SECTION;  Surgeon: Brien Few, MD;  Location: [facility];  Service: Obstetrics;  Laterality: N/A;    Family History:   Family History  Problem Relation Age of Onset  . Alcohol abuse Paternal Grandmother   . Alcohol abuse Paternal Grandfather   . Lung cancer Paternal Grandfather   . Alcohol abuse Maternal Grandfather   . Prostate cancer Maternal Grandfather   . Osteoarthritis Mother   . Lymphoma Mother     Non-Hodgkin's lymphoma  . Hyperlipidemia Mother   . Hypertension Mother   . Diabetes Mother     type 2  . Cancer Mother     breast  . Osteoarthritis Father   . Other Father     Depression.anxiety  . Lung cancer Maternal Grandmother   . Diabetes Maternal Grandmother   . Diabetes Maternal Aunt     Social History:  reports that she has never smoked. She has never used smokeless tobacco. She reports that she drinks about 2.4 oz of alcohol per week. She reports that she does not use illicit drugs.  Allergies: Zonisamide   Current Medications at time of admission:  Prior to Admission medications   Medication Sig Start Date End Date Taking? Authorizing Provider  acetaminophen (TYLENOL) 500 MG tablet Take 1,000 mg by mouth every 6 (six) hours as needed for mild pain or headache.   Yes Historical Provider, MD  fluvoxaMINE (LUVOX) 100 MG tablet Take 100 mg by mouth at bedtime.   Yes Historical Provider, MD  Fluvoxamine Maleate (LUVOX CR) 100  MG CP24 Take 1 capsule (100 mg total) by mouth 2 (two) times daily. Patient taking differently: Take 100-150 mg by mouth as directed. 150mg  in the morning, 100mg  at night 02/06/14  Yes Doe-Hyun R Shawna Orleans, DO  hydrOXYzine (ATARAX/VISTARIL) 25 MG tablet Take 25 mg by mouth every 8 (eight) hours as needed for anxiety.  10/09/14  Yes Historical Provider, MD  Prenatal Vit-Fe Fumarate-FA (PRENATAL MULTIVITAMIN) TABS tablet Take 1 tablet by mouth at bedtime.   Yes Historical Provider, MD  ranitidine (ZANTAC) 150 MG tablet TAKE ONE TABLET BY MOUTH TWICE DAILY 11/10/14  Yes Doe-Hyun Kyra Searles, DO    Intrapartum Course:  Admit for induction of labor with labor at 40 weeks per  recommendation of pyschologist (bipolar with worsening OCD in pregnancy) progression to 3.5cm dilation with normal labor curve Pain management: epidural Complicated by: NRFHR - repetitive late decels Interventions required: cesarean section delivery  Procedures: Cesarean section delivery on 02/22/2015 with delivery of  female newborn by Dr Ronita Hipps   See operative report for further details APGAR (1 MIN): 8   APGAR (5 MINS): 8    Postoperative / postpartum course:  Uncomplicated with discharge on POD 2 Increased Luvox dose to 150 mg daily - recheck in 2 weeks on dose adequacy  Discharge Instructions:  Discharged Condition: stable  Activity: pelvic rest and postoperative restrictions x 2   Diet: routine  Medications:    Medication List    STOP taking these medications        fluconazole 150 MG tablet  Commonly known as:  DIFLUCAN     HYDROcodone-acetaminophen 5-325 MG per tablet  Commonly known as:  NORCO/VICODIN      TAKE these medications        acetaminophen 500 MG tablet  Commonly known as:  TYLENOL  Take 1,000 mg by mouth every 6 (six) hours as needed for mild pain or headache.     fluvoxaMINE 50 MG tablet  Commonly known as:  LUVOX  Take 3 tablets (150 mg total) by mouth daily.     hydrOXYzine 25 MG tablet  Commonly known as:  ATARAX/VISTARIL  Take 25 mg by mouth every 8 (eight) hours as needed for anxiety.     ibuprofen 600 MG tablet  Commonly known as:  ADVIL,MOTRIN  Take 1 tablet (600 mg total) by mouth every 6 (six) hours.     oxyCODONE-acetaminophen 5-325 MG per tablet  Commonly known as:  PERCOCET/ROXICET  Take 1 tablet by mouth every 4 (four) hours as needed (for pain scale 4-7).     prenatal multivitamin Tabs tablet  Take 1 tablet by mouth at bedtime.     ranitidine 150 MG tablet  Commonly known as:  ZANTAC  TAKE ONE TABLET BY MOUTH TWICE DAILY        Wound Care: keep clean and dry / remove honeycomb POD 4-5 Postpartum Instructions:  [facility] discharge booklet - instructions reviewed  Discharge to: Home  Follow up :  [facility] in 2 weeks for interval visit with Mel Almond or [facility] for recheck emotional state PP - increased Luvox doe to 150mg  PP [facility] in 6 weeks for routine postpartum visit with Dr Ronita Hipps  Consider starting vitamin D for winter while BF with psych history and hx muscle &fatigue syndrome                Signed: Artelia Laroche CNM, MSN, [facility] 02/24/2015, 10:37 AM

## 2015-02-24 NOTE — Discharge Instructions (Signed)
Office appointment at [facility] in 2 weeks - Artelia Laroche CNM or DR Ronita Hipps to assess status postpartum & make Luvox adjustments as needed CALL office for evaluation - if unable to sleep / increased OCD symptoms / erratic behavior / excessive emotional outbursts (crying or anger)  See Dr Evangeline Gula in next month  6 week postpartum appointment with Dr Ronita Hipps (Oct) Annual exam and PPD screening again at 3 month postpartum (Jan- Feb)

## 2015-02-24 NOTE — Progress Notes (Signed)
POSTOPERATIVE DAY # 2 S/P CS - NRFHR  S:         Reports feeling well - desires to go home             Tolerating po intake / no nausea / no vomiting / + flatus / no BM             Bleeding is light             Pain controlled with motrin and percocet             Up ad lib / ambulatory/ voiding QS  Newborn breast feeding  / Circumcision done  O:  VS: BP 106/50 mmHg  Pulse 84  Temp(Src) 98.7 F (37.1 C) (Oral)  Resp 18  Ht 5\' 4"  (1.626 m)  Wt 89.359 kg (197 lb)  BMI 33.80 kg/m2  SpO2 97%  LMP 05/16/2014 (Approximate)  Breastfeeding? Unknown   LABS:               Recent Labs  02/22/15 0110 02/23/15 0518  WBC 12.8* 13.2*  HGB 10.5* 10.4*  PLT 231 211               Bloodtype: A/Positive/-- (01/11 0000)  Rubella: Immune (01/11 0000)                                     Physical Exam:             Alert and Oriented X3  Lungs: Clear and unlabored  Heart: regular rate and rhythm / no mumurs  Abdomen: soft, non-tender, non-distended, active BS             Fundus: firm, non-tender, Ueven             Dressing intact              Incision:  approximated with suture / no erythema / no ecchymosis / no drainage  Perineum: intact  Lochia: light  Extremities: trace edema, no calf pain or tenderness, negative Homans  A:        POD # 2 S/P CS            OCD with worsening symptoms antenatal - managed with Luvox  P:        Routine postoperative care              2 week follow-up for OCD / adjustment with newborn at home             Continue Luvox             DC home today if newborn cleared for DC by Peds   Artelia Laroche CNM, MSN, [facility] 02/24/2015, 10:08 AM

## 2015-03-14 ENCOUNTER — Ambulatory Visit (INDEPENDENT_AMBULATORY_CARE_PROVIDER_SITE_OTHER): Payer: Managed Care, Other (non HMO) | Admitting: Family Medicine

## 2015-03-14 ENCOUNTER — Encounter: Payer: Self-pay | Admitting: Family Medicine

## 2015-03-14 ENCOUNTER — Encounter: Payer: Self-pay | Admitting: Internal Medicine

## 2015-03-14 VITALS — BP 109/72 | HR 78 | Temp 98.8°F | Ht 64.0 in | Wt 174.0 lb

## 2015-03-14 DIAGNOSIS — M546 Pain in thoracic spine: Secondary | ICD-10-CM | POA: Diagnosis not present

## 2015-03-14 MED ORDER — IBUPROFEN 800 MG PO TABS: 800.0000 mg | ORAL_TABLET | Freq: Four times a day (QID) | ORAL | Status: DC | PRN

## 2015-03-14 NOTE — Progress Notes (Signed)
Pre visit review using our clinic review tool, if applicable. No additional management support is needed unless otherwise documented below in the visit note. 

## 2015-03-14 NOTE — Progress Notes (Signed)
   Subjective:    Patient ID: Katie [name], female    DOB: 1980-11-01, 34 y.o.   MRN: [id number]  HPI Here for upper back pain. She has a hx of chronic upper and lower back pain as well as fibromyalgia. She has see Dr. Gardenia Phlegm four times for osteopathic manipulations, but these did not help. In fact the last two treatments made the pain worse. She had a C section 3 weeks ago and she is now breast feeding. She felt great during her pregnancy with little back pain, but now the old symptoms are returning. She has used Percocet in the past but this makes her too drowsy.    Review of Systems  Constitutional: Negative.   Respiratory: Negative.   Cardiovascular: Negative.   Musculoskeletal: Positive for back pain.       Objective:   Physical Exam  Constitutional: She is oriented to person, place, and time. She appears well-developed and well-nourished. No distress.  Cardiovascular: Normal rate, regular rhythm, normal heart sounds and intact distal pulses.   Pulmonary/Chest: Effort normal and breath sounds normal.  Musculoskeletal: Normal range of motion. She exhibits no edema or tenderness.  Neurological: She is alert and oriented to person, place, and time.          Assessment & Plan:  She has chronic back pain but our pharmaceutical options are limited right now because she is breast feeding. She will try Ibuprofen 800 mg prn and Tylenol 1000 mg bid prn. I also suggested she try yoga to gain more flexibility and more core strength.

## 2015-04-13 ENCOUNTER — Other Ambulatory Visit: Payer: Self-pay | Admitting: Internal Medicine

## 2015-05-07 ENCOUNTER — Ambulatory Visit: Payer: Managed Care, Other (non HMO) | Admitting: Family Medicine

## 2015-05-28 ENCOUNTER — Encounter: Payer: Self-pay | Admitting: Adult Health

## 2015-05-28 ENCOUNTER — Ambulatory Visit (INDEPENDENT_AMBULATORY_CARE_PROVIDER_SITE_OTHER): Payer: Managed Care, Other (non HMO) | Admitting: Adult Health

## 2015-05-28 VITALS — BP 108/78 | HR 114 | Temp 98.3°F | Ht 64.0 in | Wt 172.8 lb

## 2015-05-28 DIAGNOSIS — R1084 Generalized abdominal pain: Secondary | ICD-10-CM | POA: Diagnosis not present

## 2015-05-28 LAB — COMPREHENSIVE METABOLIC PANEL
ALBUMIN: 4.6 g/dL (ref 3.5–5.2)
ALT: 25 U/L (ref 0–35)
AST: 26 U/L (ref 0–37)
Alkaline Phosphatase: 112 U/L (ref 39–117)
BUN: 22 mg/dL (ref 6–23)
CHLORIDE: 105 meq/L (ref 96–112)
CO2: 28 mEq/L (ref 19–32)
CREATININE: 1.02 mg/dL (ref 0.40–1.20)
Calcium: 10 mg/dL (ref 8.4–10.5)
GFR: 65.79 mL/min (ref >60.00)
GLUCOSE: 98 mg/dL (ref 70–99)
Potassium: 4.7 mEq/L (ref 3.5–5.1)
SODIUM: 141 meq/L (ref 135–145)
TOTAL PROTEIN: 7.6 g/dL (ref 6.0–8.3)
Total Bilirubin: 0.3 mg/dL (ref 0.2–1.2)

## 2015-05-28 LAB — HEPATIC FUNCTION PANEL
ALBUMIN: 4.6 g/dL (ref 3.5–5.2)
ALK PHOS: 112 U/L (ref 39–117)
ALT: 25 U/L (ref 0–35)
AST: 26 U/L (ref 0–37)
Bilirubin, Direct: 0.1 mg/dL (ref 0.0–0.3)
TOTAL PROTEIN: 7.6 g/dL (ref 6.0–8.3)
Total Bilirubin: 0.3 mg/dL (ref 0.2–1.2)

## 2015-05-28 LAB — LIPASE: Lipase: 27 U/L (ref 11.0–59.0)

## 2015-05-28 LAB — H. PYLORI ANTIBODY, IGG: H PYLORI IGG: NEGATIVE

## 2015-05-28 NOTE — Progress Notes (Signed)
Subjective:    Patient ID: Katie [name], female    DOB: 06-20-1981, 34 y.o.   MRN: [id number]  HPI  34 year old female who presents to the office today with multiple complaints  1) Chronic upper back pain. She has been seen by Dr. Gardenia Phlegm on four different occassions for manipulation, but she states that these did not help and may have made them worse. The pain was better when she was pregnant but after she gave birth, the pain started to come back. The pain is becoming worse. Takes tylenol or motrin which helps minimally.   2) She was on Neurotin prior to having birth for fibromyalgia pain. The fibromyelgic pain in her lower extremities is becoming worse and she was wondering if there was anything she could take that would be safe while breast feeding.   3) She had her gallbladder removed in 2012?Marland Kitchen On occasion ("maybe 2 times a month) she has pain in her mid abdomen. States " if feels liked someone is shooting me with a shot gun". The pain lasts for a very short period of time, very rarely longer than 10 minutes. She does eat a lot of fast food and processed foods. Does not notice any correlation between what she eats and the pain. Happens more often at night.   Review of Systems  Constitutional: Negative.   HENT: Negative.   Respiratory: Negative.   Cardiovascular: Negative.   Gastrointestinal: Positive for abdominal pain. Negative for nausea, diarrhea and constipation.  Musculoskeletal: Positive for back pain and arthralgias. Negative for gait problem.  Neurological:       Neuropathic pain  Hematological: Negative.   All other systems reviewed and are negative.  Past Medical History  Diagnosis Date  . Asthma   . Bipolar affective disorder [facility])     onogoing Dr Nicholaus Corolla treatment for the past 10 years  . GERD (gastroesophageal reflux disease)   . Allergy   . Migraines     teenage onset with periods  . History of recurrent UTIs     cystoscopy with Dr Barnie Del [facility]  . History of kidney stones     highschool  . Depression   . Chronic fatigue disorder   . Anxiety     currently on luvox, hydroxyzine  . Postpartum care following cesarean delivery (8/11) 02/22/2015  . Iron deficiency anemia 02/23/2015    Social History   Social History  . Marital Status: Married    Spouse Name: N/A  . Number of Children: N/A  . Years of Education: N/A   Occupational History  . preschool teacher and nanny    Social History Main Topics  . Smoking status: Never Smoker   . Smokeless tobacco: Never Used  . Alcohol Use: No  . Drug Use: No  . Sexual Activity: Yes    Birth Control/ Protection: Pill   Other Topics Concern  . Not on file   Social History Narrative   Grew up in [place]   Married 5 yrs   Occupation - prev occupation.  Preschool teacher and nanny   Never smoked.  No etoh no drugs    Past Surgical History  Procedure Laterality Date  . Adenoidectomy      1990  . Knee surgery      left knee torn cartilage removed 1994  . Hemangioma excision      2009 removal from right ar  . Cholecystectomy    . Cesarean section N/A 02/22/2015  Procedure: CESAREAN SECTION;  Surgeon: Brien Few, MD;  Location: [facility];  Service: Obstetrics;  Laterality: N/A;    Family History  Problem Relation Age of Onset  . Alcohol abuse Paternal Grandmother   . Alcohol abuse Paternal Grandfather   . Lung cancer Paternal Grandfather   . Alcohol abuse Maternal Grandfather   . Prostate cancer Maternal Grandfather   . Osteoarthritis Mother   . Lymphoma Mother     Non-Hodgkin's lymphoma  . Hyperlipidemia Mother   . Hypertension Mother   . Diabetes Mother     type 2  . Cancer Mother     breast  . Osteoarthritis Father   . Other Father     Depression.anxiety  . Lung cancer Maternal Grandmother   . Diabetes Maternal Grandmother   . Diabetes Maternal Aunt     Allergies  Allergen Reactions  . Zonisamide Nausea And Vomiting    Current Outpatient  Prescriptions on File Prior to Visit  Medication Sig Dispense Refill  . fluvoxaMINE (LUVOX) 50 MG tablet Take 3 tablets (150 mg total) by mouth daily. (Patient taking differently: Take 250 mg by mouth daily. ) 30 tablet 1  . ibuprofen (ADVIL,MOTRIN) 800 MG tablet Take 1 tablet (800 mg total) by mouth every 6 (six) hours as needed for moderate pain. 60 tablet 2  . Prenatal Vit-Fe Fumarate-FA (PRENATAL MULTIVITAMIN) TABS tablet Take 1 tablet by mouth at bedtime.    . ranitidine (ZANTAC) 150 MG tablet TAKE 1 TABLET BY MOUTH TWICE A DAY 60 tablet 3   No current facility-administered medications on file prior to visit.    BP 108/78 mmHg  Pulse 114  Temp(Src) 98.3 F (36.8 C) (Oral)  Ht 5\' 4"  (1.626 m)  Wt 172 lb 12.8 oz (78.382 kg)  BMI 29.65 kg/m2  SpO2 96%  Breastfeeding? Yes       Objective:   Physical Exam  Constitutional: She is oriented to person, place, and time. She appears well-developed and well-nourished. No distress.  Cardiovascular: Normal rate, regular rhythm, normal heart sounds and intact distal pulses.  Exam reveals no gallop and no friction rub.   No murmur heard. Pulmonary/Chest: Effort normal and breath sounds normal. No respiratory distress. She has no wheezes. She has no rales. She exhibits no tenderness.  Musculoskeletal: Normal range of motion. She exhibits no edema or tenderness.  Neurological: She is alert and oriented to person, place, and time.  Skin: She is not diaphoretic.  Psychiatric: She has a normal mood and affect. Her behavior is normal. Judgment and thought content normal.  Nursing note and vitals reviewed.      Assessment & Plan:  She understands that she is limited to what she can have for pain control. I advised that she can tylenol or motrin until she is done breast feeding.   She is going to keep a food diary to see if any foods are triggering her abdominal pain. She is taking Zantac currently. She also eats poorly and understands that she  needs to change her diet. Will check basic labs and liver function as well as Lipase and H.pylori. Consider referral to GI in the future.

## 2015-05-28 NOTE — Patient Instructions (Signed)
It was great meeting you today!  As discussed start keeping a food log and mark when you have these attacks.   I will follow up with you regarding your labs.   Follow up with me as needed.

## 2015-06-15 ENCOUNTER — Ambulatory Visit: Payer: Self-pay | Admitting: Internal Medicine

## 2015-07-11 ENCOUNTER — Ambulatory Visit (INDEPENDENT_AMBULATORY_CARE_PROVIDER_SITE_OTHER): Payer: Managed Care, Other (non HMO) | Admitting: Licensed Clinical Social Worker

## 2015-07-11 DIAGNOSIS — F331 Major depressive disorder, recurrent, moderate: Secondary | ICD-10-CM | POA: Diagnosis not present

## 2015-07-18 ENCOUNTER — Ambulatory Visit (INDEPENDENT_AMBULATORY_CARE_PROVIDER_SITE_OTHER): Payer: Managed Care, Other (non HMO) | Admitting: Licensed Clinical Social Worker

## 2015-07-18 DIAGNOSIS — F331 Major depressive disorder, recurrent, moderate: Secondary | ICD-10-CM

## 2015-07-27 ENCOUNTER — Ambulatory Visit (INDEPENDENT_AMBULATORY_CARE_PROVIDER_SITE_OTHER): Payer: Managed Care, Other (non HMO) | Admitting: Licensed Clinical Social Worker

## 2015-07-27 DIAGNOSIS — F331 Major depressive disorder, recurrent, moderate: Secondary | ICD-10-CM

## 2015-08-08 ENCOUNTER — Ambulatory Visit (INDEPENDENT_AMBULATORY_CARE_PROVIDER_SITE_OTHER): Payer: Managed Care, Other (non HMO) | Admitting: Licensed Clinical Social Worker

## 2015-08-08 DIAGNOSIS — F331 Major depressive disorder, recurrent, moderate: Secondary | ICD-10-CM

## 2015-08-22 ENCOUNTER — Ambulatory Visit: Payer: Managed Care, Other (non HMO) | Admitting: Licensed Clinical Social Worker

## 2015-08-22 ENCOUNTER — Other Ambulatory Visit: Payer: Self-pay | Admitting: Family Medicine

## 2015-09-23 ENCOUNTER — Other Ambulatory Visit: Payer: Self-pay | Admitting: Internal Medicine

## 2015-09-25 ENCOUNTER — Encounter: Payer: Self-pay | Admitting: Adult Health

## 2015-09-25 ENCOUNTER — Ambulatory Visit (INDEPENDENT_AMBULATORY_CARE_PROVIDER_SITE_OTHER): Payer: Managed Care, Other (non HMO) | Admitting: Adult Health

## 2015-09-25 VITALS — BP 114/76 | Temp 99.3°F | Ht 64.0 in | Wt 169.3 lb

## 2015-09-25 DIAGNOSIS — M545 Low back pain, unspecified: Secondary | ICD-10-CM

## 2015-09-25 DIAGNOSIS — E049 Nontoxic goiter, unspecified: Secondary | ICD-10-CM

## 2015-09-25 LAB — T4, FREE: FREE T4: 0.91 ng/dL (ref 0.60–1.60)

## 2015-09-25 LAB — T3, FREE: T3, Free: 3.6 pg/mL (ref 2.3–4.2)

## 2015-09-25 LAB — TSH: TSH: 0.57 u[IU]/mL (ref 0.35–4.50)

## 2015-09-25 NOTE — Progress Notes (Signed)
   Subjective:    Patient ID: Katie [name], female    DOB: Jul 02, 1981, 35 y.o.   MRN: [id number]  HPI  35 year old female who presents to the office today for mid/low back pain that started last night.She bent down to put her son in a walker and when she twisted she had severe pain, with pins and needels down her left arm and left shoulder. She endorses feeling as though she has lost some grip strength in her left hand.    She is nursing currently.    She is also complaining of feeling as though her thyroid is sore and enlarged, especially when she lays down. She cannot pin point an exact time when she first noticed this.   Review of Systems  Constitutional: Positive for activity change.  HENT: Positive for sore throat. Negative for postnasal drip, rhinorrhea, sinus pressure and trouble swallowing.   Eyes: Negative.   Respiratory: Negative.   Musculoskeletal: Positive for myalgias and back pain. Negative for joint swelling, gait problem, neck pain and neck stiffness.  Skin: Negative.   Neurological: Negative.   All other systems reviewed and are negative.      Objective:   Physical Exam  Constitutional: She is oriented to person, place, and time. She appears well-developed and well-nourished. No distress.  HENT:  Head: Normocephalic and atraumatic.  Right Ear: External ear normal.  Left Ear: External ear normal.  Nose: Nose normal.  Mouth/Throat: Oropharynx is clear and moist. No oropharyngeal exudate.  Eyes: Conjunctivae and EOM are normal. Pupils are equal, round, and reactive to light.  Neck: Normal range of motion. Neck supple. Thyromegaly present.  Slight swelling noted on right side of thyroid.   Cardiovascular: Normal rate, normal heart sounds and intact distal pulses.  Exam reveals no gallop and no friction rub.   No murmur heard. Pulmonary/Chest: Effort normal and breath sounds normal. No respiratory distress. She has no wheezes. She has no rales. She exhibits no  tenderness.  Musculoskeletal: Normal range of motion. She exhibits tenderness. She exhibits no edema.  Pain with palpation to mid lower back. Also with pain with palpation to left shoulder.   She does have decreased grip strength in left hand.   Lymphadenopathy:    She has no cervical adenopathy.  Neurological: She is alert and oriented to person, place, and time.  Skin: Skin is warm and dry. No rash noted. She is not diaphoretic. No erythema. No pallor.  Psychiatric: She has a normal mood and affect. Her behavior is normal. Judgment and thought content normal.  Nursing note and vitals reviewed.     Assessment & Plan:  1. Enlarged thyroid - TSH - US THYROID; Future - T3, Free - T4, Free  2. Midline low back pain without sciatica - She is nursing so we are limited on what we can use.  - Tylenol or ibuprofen and heating pads

## 2015-09-25 NOTE — Patient Instructions (Signed)
Use Ibuprofen 600mg  every 8 hours as needed for pain . Also use a heating pad and do stretching exercises.   I will follow up with you regarding your blood work. Someone will call you to schedule your ultra sound.

## 2015-09-25 NOTE — Progress Notes (Signed)
Pre visit review using our clinic review tool, if applicable. No additional management support is needed unless otherwise documented below in the visit note. 

## 2015-10-01 ENCOUNTER — Ambulatory Visit
Admission: RE | Admit: 2015-10-01 | Discharge: 2015-10-01 | Disposition: A | Discharge status: Home / Self Care | Payer: Managed Care, Other (non HMO) | Source: Ambulatory Visit | Attending: Adult Health | Admitting: Adult Health

## 2015-10-01 DIAGNOSIS — E049 Nontoxic goiter, unspecified: Secondary | ICD-10-CM

## 2015-10-24 ENCOUNTER — Ambulatory Visit: Payer: Self-pay | Admitting: Internal Medicine

## 2016-11-10 IMAGING — US US ABDOMEN COMPLETE
1 series · 14 of 25 positions shown · non-contrast
Comparison: Ultrasound 02/10/2014.

CLINICAL DATA: Abdominal pain since cholecystectomy [DATE].

EXAM:
ULTRASOUND ABDOMEN COMPLETE

[Series 1: us abdomen complete · 0.26mm/px · 14 of 66 slices shown]
[im 1/66]
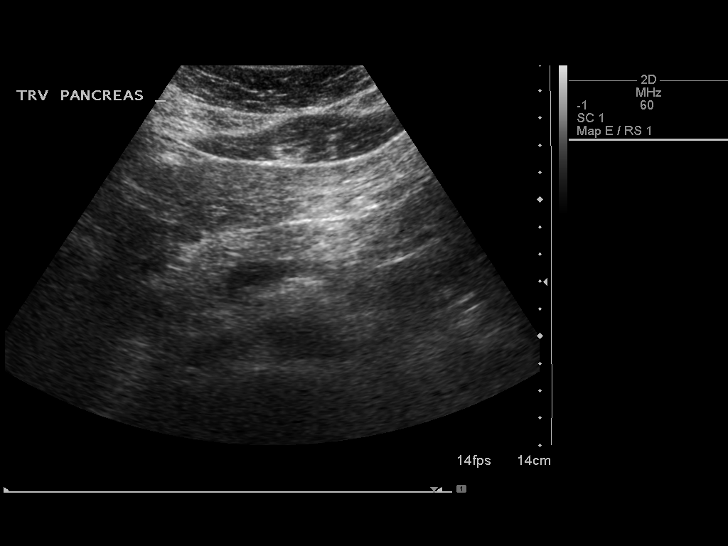
[im 6/66]
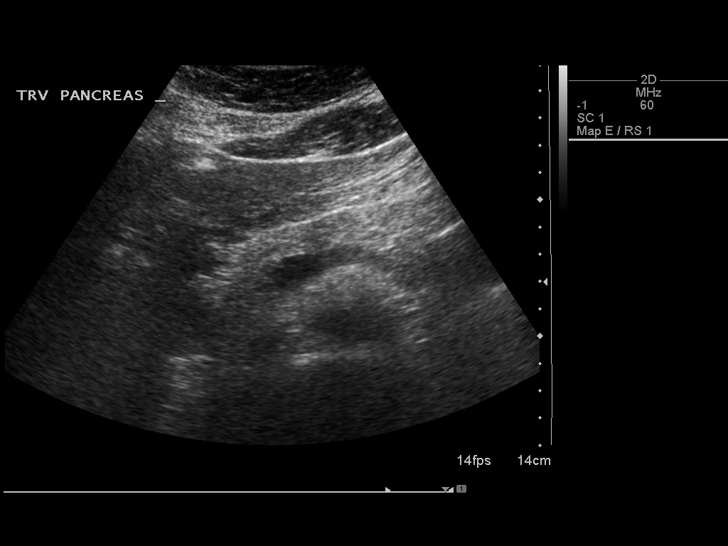
[im 11/66]
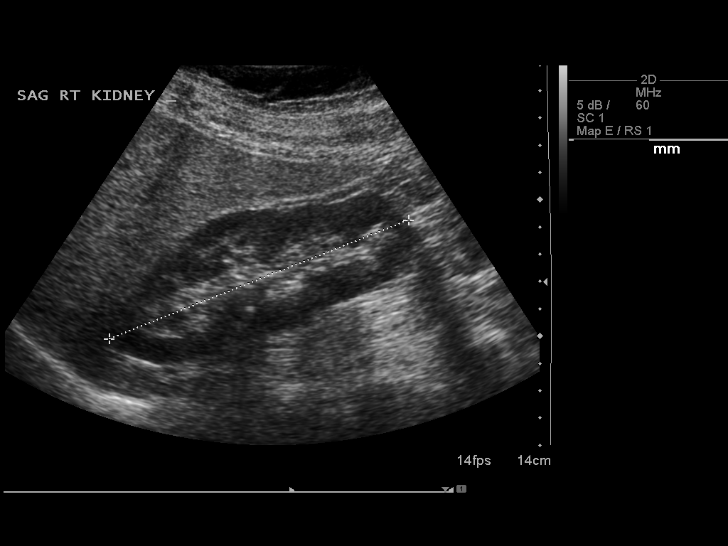
[im 17/66]
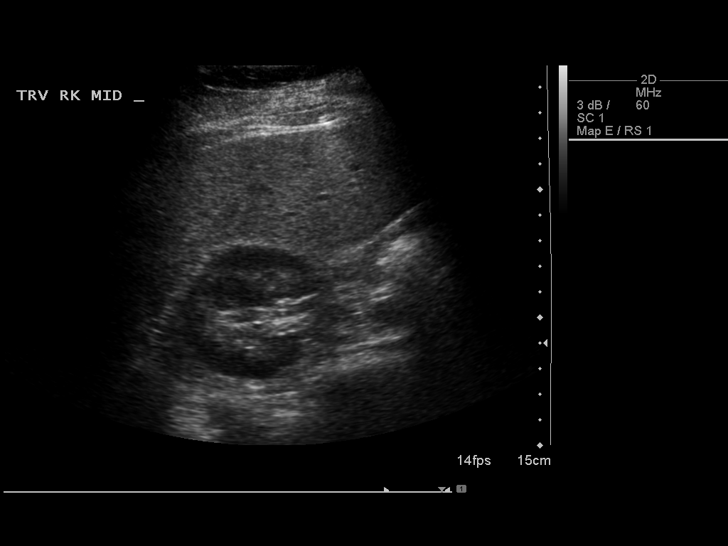
[im 22/66]
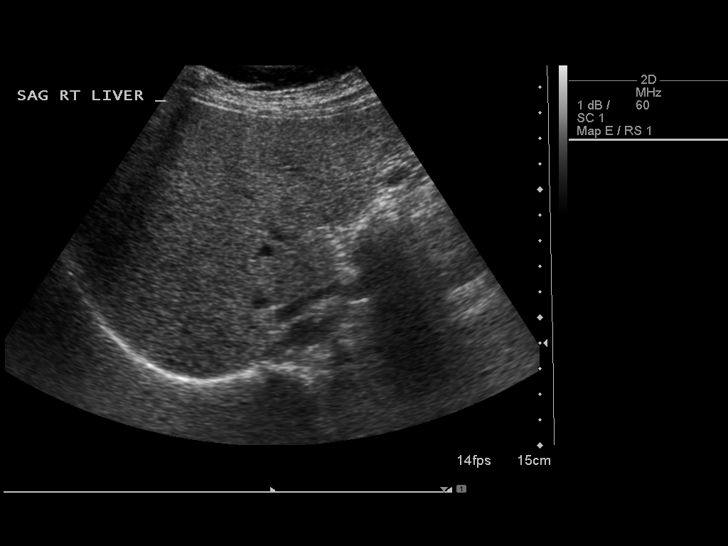
[im 25/66]
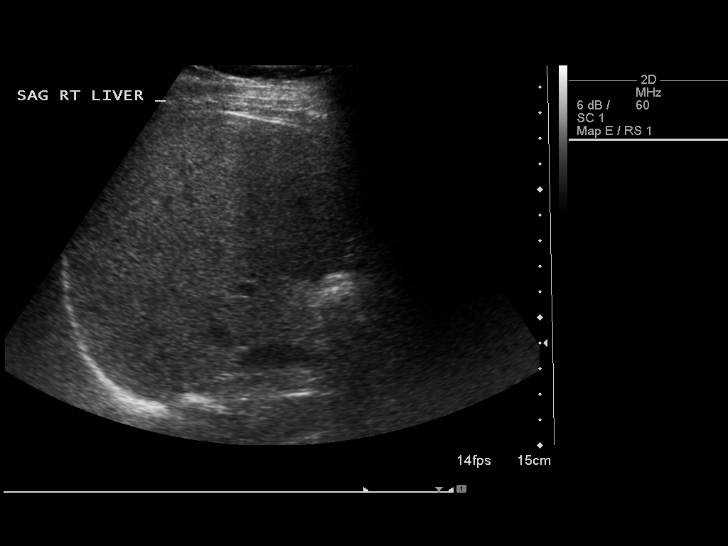
[im 30/66]
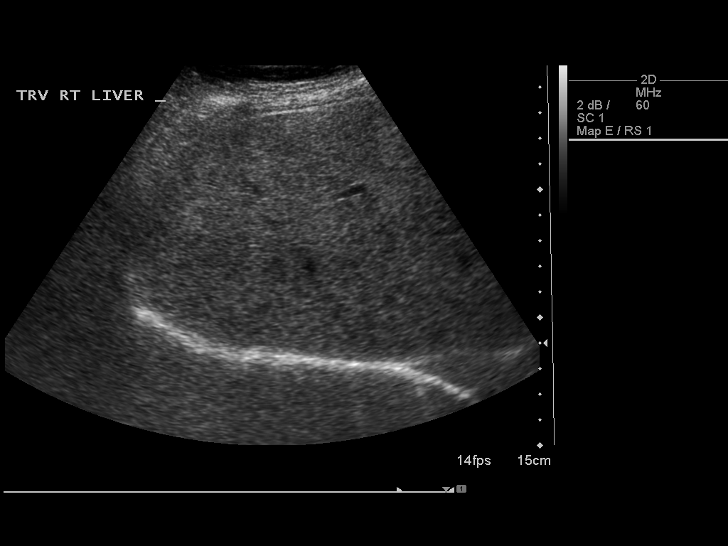
[im 36/66]
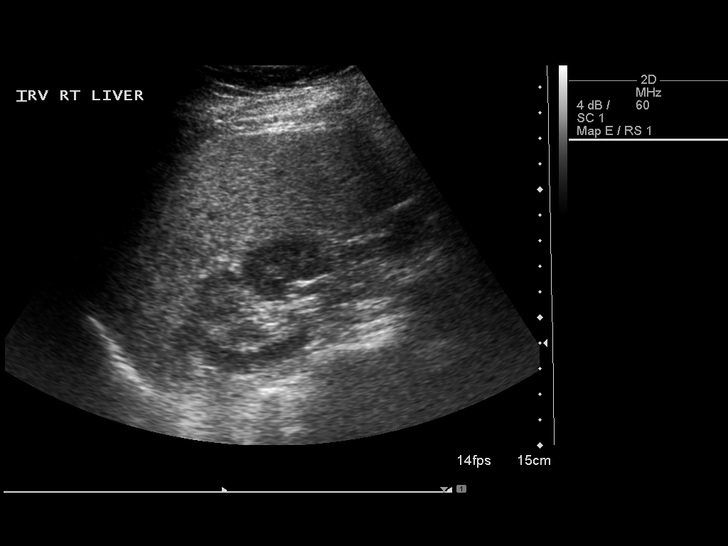
[im 41/66]
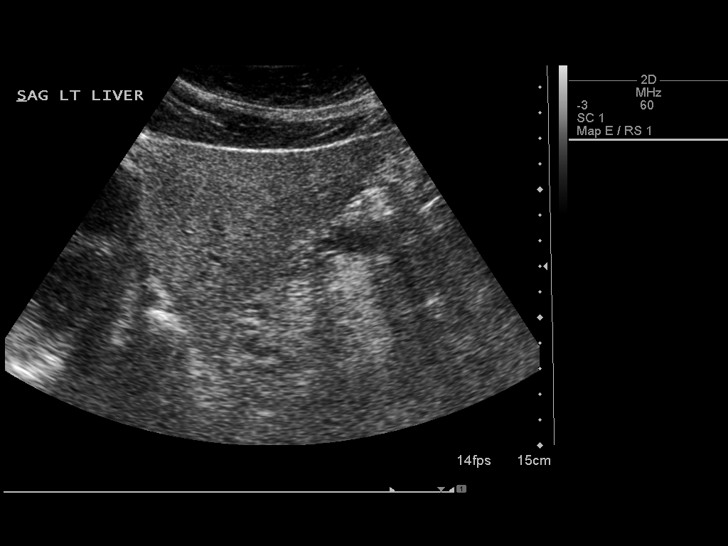
[im 44/66]
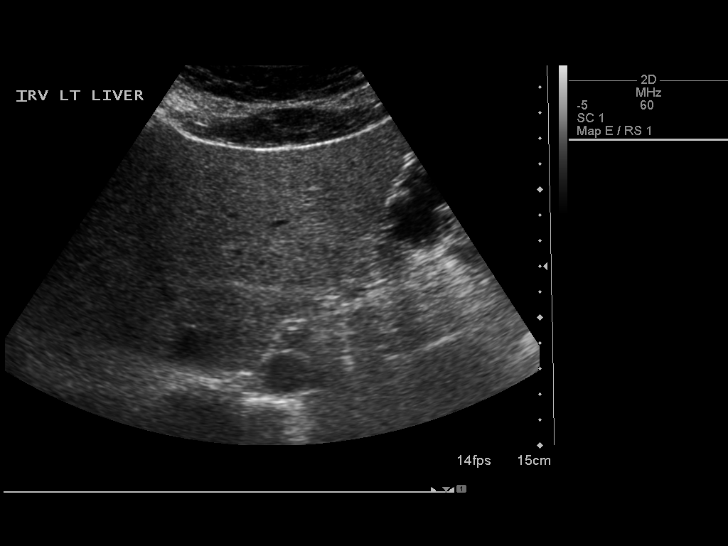
[im 49/66]
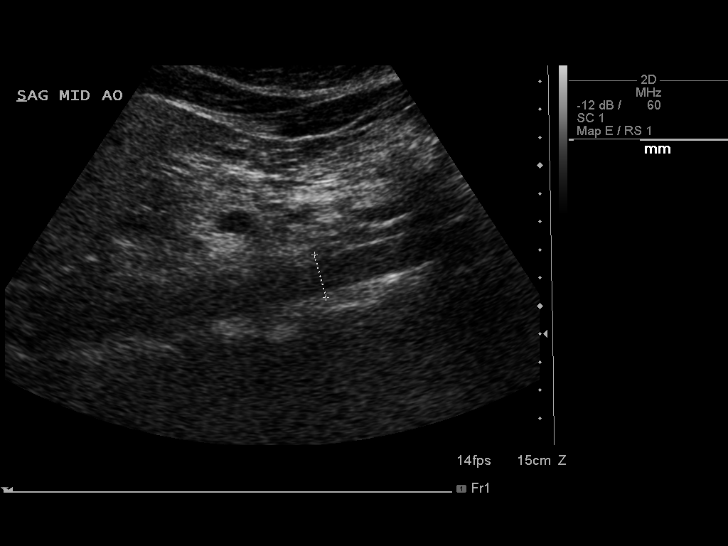
[im 55/66]
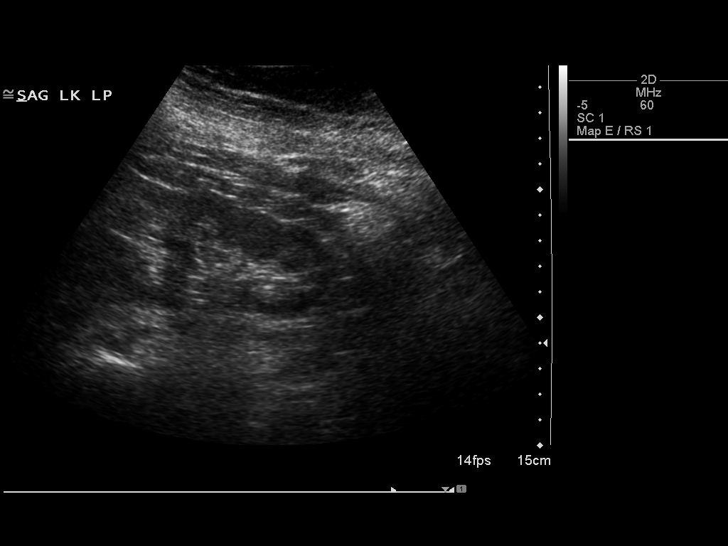
[im 60/66]
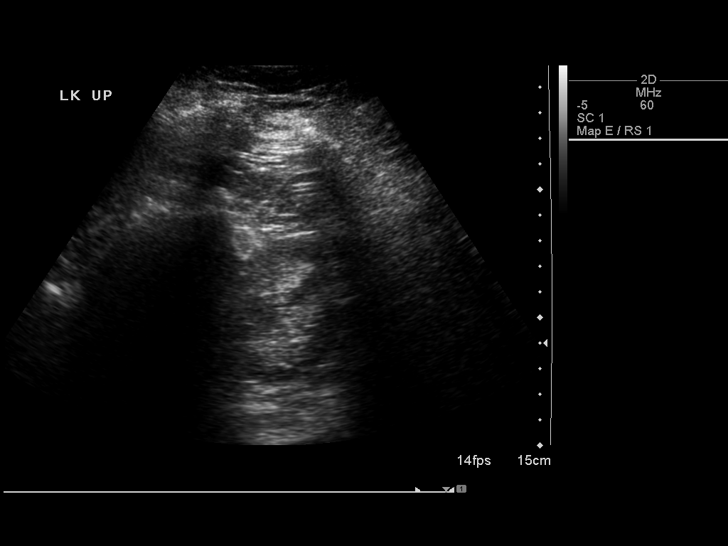
[im 66/66]
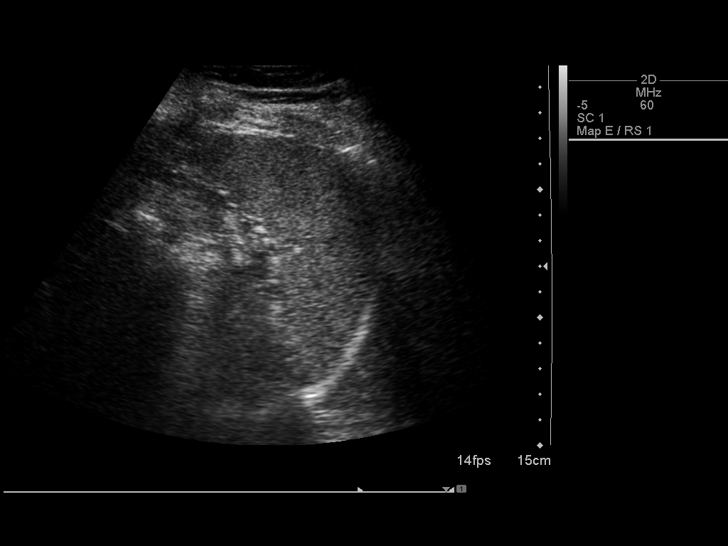

[14 of 25 positions shown; findings below may reference images not displayed]

FINDINGS: Gallbladder: Cholecystectomy. No fluid noted in the gallbladder bed.

Common bile duct: Diameter: 4.8 mm.

Liver: Echogenic liver suggesting fatty infiltration.

IVC: No abnormality visualized.

Pancreas: Visualized portion unremarkable.

Spleen: Size and appearance within normal limits.

Right Kidney: Length: 12.4 cm.. Renal cortical thinning noted.
Echogenicity within normal limits. No mass or hydronephrosis
visualized.

Left Kidney: Length: 11.3 cm. Renal cortical thinning. Echogenicity
within normal limits. No mass or hydronephrosis visualized.

Abdominal aorta: No aneurysm visualized.

Other findings: None.
IMPRESSION: 1. Cholecystectomy. No biliary distention. No abnormal fluid
collection is noted in the gallbladder bed.
2. Echogenic liver suggesting fatty infiltration.
3. Bilateral renal cortical thinning suggesting chronic medical
renal disease.

## 2016-12-26 ENCOUNTER — Encounter: Payer: Self-pay | Admitting: Family Medicine

## 2016-12-26 ENCOUNTER — Ambulatory Visit (INDEPENDENT_AMBULATORY_CARE_PROVIDER_SITE_OTHER): Payer: Managed Care, Other (non HMO) | Admitting: Family Medicine

## 2016-12-26 VITALS — BP 110/70 | HR 82 | Temp 97.9°F | Ht 64.0 in | Wt 190.0 lb

## 2016-12-26 DIAGNOSIS — H66001 Acute suppurative otitis media without spontaneous rupture of ear drum, right ear: Secondary | ICD-10-CM

## 2016-12-26 DIAGNOSIS — J069 Acute upper respiratory infection, unspecified: Secondary | ICD-10-CM | POA: Diagnosis not present

## 2016-12-26 MED ORDER — FLUTICASONE PROPIONATE 50 MCG/ACT NA SUSP: 1.0000 | Freq: Two times a day (BID) | NASAL | 0 refills | Status: DC

## 2016-12-26 MED ORDER — AMOXICILLIN-POT CLAVULANATE 875-125 MG PO TABS: 1.0000 | ORAL_TABLET | Freq: Two times a day (BID) | ORAL | 0 refills | Status: DC

## 2016-12-26 MED ORDER — ANTIPYRINE-BENZOCAINE 5.4-1.4 % OT SOLN: 3.0000 [drp] | OTIC | 0 refills | Status: AC | PRN

## 2016-12-26 NOTE — Patient Instructions (Signed)
  [name] I have seen you today for an acute visit.  A few things to remember from today's visit:   URI, acute - Plan: fluticasone (FLONASE) 50 MCG/ACT nasal spray  Acute suppurative otitis media of right ear without spontaneous rupture of tympanic membrane, recurrence not specified - Plan: amoxicillin-clavulanate (AUGMENTIN) 875-125 MG tablet, antipyrine-benzocaine (AURALGAN) OTIC solution       Over the counter medications as decongestants and cold medications usually help, they need to be taken with caution if there is a history of high blood pressure or palpitations. Tylenol and/or Ibuprofen also helps with most symptoms (headache, muscle aching, fever,etc) Plenty of fluids. Honey helps with cough. Steam inhalations helps with runny nose, nasal congestion, and may prevent sinus infections. Cough and nasal congestion could last a few days and sometimes weeks. Please follow in not any better in 1-2 weeks or if symptoms get worse.    Medications prescribed today are intended for short period of time and will not be refill upon request, a follow up appointment might be necessary to discuss continuation of of treatment if appropriate.     In general please monitor for signs of worsening symptoms and seek immediate medical attention if any concerning.   Please be sure you have an appointment already scheduled with your PCP before you leave today.

## 2016-12-26 NOTE — Progress Notes (Signed)
HPI:  ACUTE VISIT  Chief Complaint  Patient presents with  . chest congestion  . right ear pain    [name] is a 36 y.o.female here today complaining of 5 days of respiratory symptoms, yesterday she started with severe right earache and fullness sensation. No ear drainage or external ear erythema.   Non productive cough. + Dysphonia and sore throat, both have improved.   URI   This is a new problem. The current episode started in the past 7 days. There has been no fever. Associated symptoms include congestion, coughing, ear pain, a plugged ear sensation, rhinorrhea and a sore throat. Pertinent negatives include no abdominal pain, diarrhea, headaches, nausea, rash, sinus pain, swollen glands, vomiting or wheezing. She has tried NSAIDs for the symptoms. The treatment provided no relief.  Otalgia   There is pain in the right ear. This is a new problem. The current episode started yesterday. The problem occurs constantly. The problem has been gradually worsening. Associated symptoms include coughing, rhinorrhea and a sore throat. Pertinent negatives include no abdominal pain, diarrhea, headaches, rash or vomiting. She has tried NSAIDs for the symptoms. The treatment provided mild relief.     No Hx of recent travel. + Sick contact: Her son was sick with a URI recently, viral. No known insect bite.  Hx of seasonal allergies: Denies. On PMHx she has asthma listed.  OTC medications for this problem: Nasal afrin and Ibuprofen.   Review of Systems  Constitutional: Positive for appetite change and fatigue. Negative for fever.  HENT: Positive for congestion, ear pain, postnasal drip, rhinorrhea and sore throat. Negative for facial swelling, mouth sores, sinus pain, trouble swallowing and voice change.   Eyes: Negative for discharge, redness and itching.  Respiratory: Positive for cough. Negative for chest tightness, shortness of breath and wheezing.   Gastrointestinal:  Negative for abdominal pain, diarrhea, nausea and vomiting.  Musculoskeletal: Negative for gait problem and myalgias.  Skin: Negative for rash.  Neurological: Negative for weakness and headaches.  Hematological: Negative for adenopathy. Does not bruise/bleed easily.  Psychiatric/Behavioral: Negative for confusion. The patient is not nervous/anxious.      Current Outpatient Prescriptions on File Prior to Visit  Medication Sig Dispense Refill  . fluvoxaMINE (LUVOX) 100 MG tablet TAKE ONE TABLET BY MOUTH TWICE DAILY    . ranitidine (ZANTAC) 150 MG tablet TAKE 1 TABLET BY MOUTH TWICE A DAY 60 tablet 3   No current facility-administered medications on file prior to visit.      Past Medical History:  Diagnosis Date  . Allergy   . Anxiety    currently on luvox, hydroxyzine  . Asthma   . Bipolar affective disorder [facility])    onogoing Dr Nicholaus Corolla treatment for the past 10 years  . Chronic fatigue disorder   . Depression   . GERD (gastroesophageal reflux disease)   . History of kidney stones    highschool  . History of recurrent UTIs    cystoscopy with Dr Barnie Del [facility]  . Iron deficiency anemia 02/23/2015  . Migraines    teenage onset with periods  . Postpartum care following cesarean delivery (8/11) 02/22/2015   Allergies  Allergen Reactions  . Zonisamide Nausea And Vomiting    Social History   Social History  . Marital status: Married    Spouse name: N/A  . Number of children: N/A  . Years of education: N/A   Occupational History  . 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 Smoking status: Never Smoker  . Smokeless tobacco: Never Used  . Alcohol use No  . Drug use: No  . Sexual activity: Yes    Birth control/ protection: Pill   Other Topics Concern  . None   Social History Narrative   Grew up in [place]   Married 5 yrs   Occupation - prev occupation.  Preschool teacher and nanny   Never smoked.  No etoh no drugs    Vitals:    12/26/16 1531  BP: 110/70  Pulse: 82  Temp: 97.9 F (36.6 C)  O2 sat at RA 98% Body mass index is 32.61 kg/m.   Physical Exam  Nursing note and vitals reviewed. Constitutional: She is oriented to person, place, and time. She appears well-developed. She does not appear ill. No distress.  HENT:  Head: Atraumatic.  Right Ear: External ear and ear canal normal. No drainage. No mastoid tenderness. Tympanic membrane is erythematous and bulging. A middle ear effusion is present.  Left Ear: Tympanic membrane, external ear and ear canal normal.  Nose: Rhinorrhea present. Right sinus exhibits no maxillary sinus tenderness and no frontal sinus tenderness. Left sinus exhibits no maxillary sinus tenderness and no frontal sinus tenderness.  Mouth/Throat: Uvula is midline and mucous membranes are normal. Posterior oropharyngeal erythema present. No oropharyngeal exudate or posterior oropharyngeal edema.  Mild dysphonia. Post nasal drainage.  Eyes: Conjunctivae are normal.  Cardiovascular: Normal rate and regular rhythm.   No murmur heard. Respiratory: Effort normal and breath sounds normal. No stridor. No respiratory distress.  Lymphadenopathy:       Head (right side): Submandibular adenopathy present.       Head (left side): No submandibular adenopathy present.    She has cervical adenopathy.       Right cervical: Posterior cervical adenopathy present.       Left cervical: Posterior cervical adenopathy present.  Neurological: She is alert and oriented to person, place, and time. She has normal strength. Gait normal.  Skin: Skin is warm. No rash noted. No erythema.  Psychiatric: She has a normal mood and affect. Her speech is normal.  Well groomed, good eye contact.    ASSESSMENT AND PLAN:   Katie [name] was seen today for chest congestion and right ear pain.  Diagnoses and all orders for this visit:  URI, acute  Most likely viral. Explained that cough and nasal congestion can last a few  days and sometimes weeks. Caution with nasal Afrin. F/U as needed.   -     fluticasone (FLONASE) 50 MCG/ACT nasal spray; Place 1 spray into both nostrils 2 (two) times daily.  Acute suppurative otitis media of right ear without spontaneous rupture of tympanic membrane, recurrence not specified  Explained it could be viral but because clinical findings I recommend starting Augmentin. Instructed about warning signs. Recommended following with PCP in 10 days, sooner if needed.   -     amoxicillin-clavulanate (AUGMENTIN) 875-125 MG tablet; Take 1 tablet by mouth 2 (two) times daily. -     antipyrine-benzocaine (AURALGAN) OTIC solution; Place 3-4 drops into the right ear every 2 (two) hours as needed for ear pain.      -Ms. Josem Kaufmann was advised to seek attention immediately if symptoms worsen or to follow sooner if new concerns arise.       Velva Molinari G. Martinique, MD  [facility]. [place] office.

## 2017-01-04 NOTE — Progress Notes (Signed)
HPI:   Katie [name] is a 36 y.o. female, who is here today to follow on recent OV.   She was seen on 12/26/16 for acute visit. She was c/o right earache. On examination TM was severely bulged and erythematous, Dx with acute OM with effusion, she was started on Augmentin and recommended to follow in 10 days.   Overall she is feeling greatly better, no new symptoms reported.  She has not had earache for 2-3 days. Still feeling fullness sensation right ear and popping sensation. Denies fever,hearing loss, ear drainage, or skin rash.   Review of Systems  Constitutional: Negative for chills, fatigue and fever.  HENT: Negative for ear discharge, ear pain and hearing loss.   Respiratory: Negative for shortness of breath and wheezing.       Current Outpatient Prescriptions on File Prior to Visit  Medication Sig Dispense Refill  . fluticasone (FLONASE) 50 MCG/ACT nasal spray Place 1 spray into both nostrils 2 (two) times daily. 16 g 0  . fluvoxaMINE (LUVOX) 100 MG tablet TAKE ONE TABLET BY MOUTH TWICE DAILY    . ranitidine (ZANTAC) 150 MG tablet TAKE 1 TABLET BY MOUTH TWICE A DAY 60 tablet 3   No current facility-administered medications on file prior to visit.      Past Medical History:  Diagnosis Date  . Allergy   . Anxiety    currently on luvox, hydroxyzine  . Asthma   . Bipolar affective disorder [facility])    onogoing Dr Nicholaus Corolla treatment for the past 10 years  . Chronic fatigue disorder   . Depression   . GERD (gastroesophageal reflux disease)   . History of kidney stones    highschool  . History of recurrent UTIs    cystoscopy with Dr Barnie Del [facility]  . Iron deficiency anemia 02/23/2015  . Migraines    teenage onset with periods  . Postpartum care following cesarean delivery (8/11) 02/22/2015   Allergies  Allergen Reactions  . Zonisamide Nausea And Vomiting    Social History   Social History  . Marital status: Married    Spouse name:  N/A  . Number of children: N/A  . Years of education: N/A   Occupational History  . preschool teacher and nanny    Social History Main Topics  . Smoking status: Never Smoker  . Smokeless tobacco: Never Used  . Alcohol use No  . Drug use: No  . Sexual activity: Yes    Birth control/ protection: Pill   Other Topics Concern  . None   Social History Narrative   Grew up in [place]   Married 5 yrs   Occupation - prev occupation.  Preschool teacher and nanny   Never smoked.  No etoh no drugs    Vitals:   01/05/17 1424  BP: 110/70  Pulse: 90  Resp: 12   Body mass index is 32.61 kg/m.   Physical Exam  Nursing note and vitals reviewed. Constitutional: She is oriented to person, place, and time. She appears well-developed. She does not appear ill. No distress.  HENT:  Head: Atraumatic.  Right Ear: External ear and ear canal normal. Tympanic membrane is injected. Tympanic membrane is not erythematous and not bulging. A middle ear effusion is present.  Left Ear: Tympanic membrane, external ear and ear canal normal.  Mouth/Throat: Oropharynx is clear and moist and mucous membranes are normal.  Eyes: Conjunctivae are normal.  Respiratory: Effort normal and breath sounds normal. No  respiratory distress.  Lymphadenopathy:    She has no cervical adenopathy.  Neurological: She is alert and oriented to person, place, and time. She has normal strength. Gait normal.  Skin: Skin is warm. No erythema.  Psychiatric: She has a normal mood and affect.  Well groomed, good eye contact.    ASSESSMENT AND PLAN:   Pretty was seen today for follow-up.  Diagnoses and all orders for this visit:  Right otitis media with effusion  Acute infection resolved, still mild effusion. I do not think further treatment with abx is needed. Instructed to let me know if she has earache again.  Eustachian tube dysfunction, right  Autoinflation maneuvers and OTC decongestants may help. Instructed about  warning signs. F/U as needed.    Jamaica Inthavong G. Martinique, MD  [facility]. [place] office.

## 2017-01-05 ENCOUNTER — Encounter: Payer: Self-pay | Admitting: Family Medicine

## 2017-01-05 ENCOUNTER — Ambulatory Visit (INDEPENDENT_AMBULATORY_CARE_PROVIDER_SITE_OTHER): Payer: Managed Care, Other (non HMO) | Admitting: Family Medicine

## 2017-01-05 VITALS — BP 110/70 | HR 90 | Resp 12 | Ht 64.0 in | Wt 190.0 lb

## 2017-01-05 DIAGNOSIS — H6591 Unspecified nonsuppurative otitis media, right ear: Secondary | ICD-10-CM

## 2017-01-05 DIAGNOSIS — H6981 Other specified disorders of Eustachian tube, right ear: Secondary | ICD-10-CM | POA: Diagnosis not present

## 2017-01-26 ENCOUNTER — Ambulatory Visit: Payer: Self-pay | Admitting: Family Medicine

## 2017-02-05 ENCOUNTER — Other Ambulatory Visit: Payer: Self-pay | Admitting: Family Medicine

## 2017-02-05 DIAGNOSIS — J069 Acute upper respiratory infection, unspecified: Secondary | ICD-10-CM

## 2017-04-02 ENCOUNTER — Encounter: Payer: Self-pay | Admitting: Family Medicine

## 2018-01-19 DIAGNOSIS — Z7689 Persons encountering health services in other specified circumstances: Secondary | ICD-10-CM | POA: Diagnosis not present

## 2018-03-27 DIAGNOSIS — Z23 Encounter for immunization: Secondary | ICD-10-CM | POA: Diagnosis not present

## 2018-03-30 DIAGNOSIS — H20012 Primary iridocyclitis, left eye: Secondary | ICD-10-CM | POA: Diagnosis not present

## 2018-06-01 DIAGNOSIS — J189 Pneumonia, unspecified organism: Secondary | ICD-10-CM | POA: Diagnosis not present

## 2018-06-13 DIAGNOSIS — J189 Pneumonia, unspecified organism: Secondary | ICD-10-CM | POA: Diagnosis not present

## 2018-07-05 ENCOUNTER — Ambulatory Visit (INDEPENDENT_AMBULATORY_CARE_PROVIDER_SITE_OTHER): Payer: 59 | Admitting: Family Medicine

## 2018-07-05 ENCOUNTER — Encounter: Payer: Self-pay | Admitting: Family Medicine

## 2018-07-05 VITALS — BP 120/70 | HR 93 | Temp 98.5°F | Resp 12 | Ht 64.0 in | Wt 205.2 lb

## 2018-07-05 DIAGNOSIS — R059 Cough, unspecified: Secondary | ICD-10-CM

## 2018-07-05 DIAGNOSIS — R05 Cough: Secondary | ICD-10-CM

## 2018-07-05 DIAGNOSIS — R2231 Localized swelling, mass and lump, right upper limb: Secondary | ICD-10-CM | POA: Diagnosis not present

## 2018-07-05 NOTE — Patient Instructions (Addendum)
A few things to remember from today's visit:   Forearm mass, right - Plan: Ambulatory referral to General Surgery  Cough   Please be sure medication list is accurate. If a new problem present, please set up appointment sooner than planned today.

## 2018-07-05 NOTE — Progress Notes (Signed)
ACUTE VISIT   HPI:  Chief Complaint  Patient presents with  . Lump on right arm    noticed last week, some itching and achy    Katie [name] is a 37 y.o. female, who is here today with her mother complaining of a painful "bump" in right forearm. She noted lesion about 4 days ago. It is most noticeable when flexing elbow. Denies limitation of RUE joints ROM. Denies fever,chills,local edema or erythema,or rash.  She has not noted growth since first noted.   After she noted lesion she started with achy like pain,mild, no radiated. Pain is exacerbated by lifting. She has also noted some "deep itching" sensation. In 2009 she had neurofibroma removed in 2009 from same arm.  She has not tried OTC medications.  Recently Dx and treated for pneumonia. According to her mother,she completed 2 rounds of abx and still coughing. Non productive cough. She denies fever,chills, dyspnea, or wheezing. Chest wall pain with coughing spells. In general she is feeling better.   Review of Systems  Constitutional: Positive for fatigue. Negative for chills and fever.  HENT: Negative for mouth sores and sore throat.   Respiratory: Positive for cough. Negative for shortness of breath and wheezing.   Cardiovascular: Negative for chest pain and palpitations.  Musculoskeletal: Negative for joint swelling and myalgias.  Skin: Negative for rash and wound.  Neurological: Negative for syncope, weakness and headaches.      Current Outpatient Medications on File Prior to Visit  Medication Sig Dispense Refill  . fluvoxaMINE (LUVOX) 100 MG tablet TAKE ONE TABLET BY MOUTH TWICE DAILY    . lithium 600 MG capsule Take 600 mg by mouth at bedtime.    Marland Kitchen lithium carbonate 300 MG capsule Take 300 mg by mouth daily.      No current facility-administered medications on file prior to visit.      Past Medical History:  Diagnosis Date  . Allergy   . Anxiety    currently on luvox, hydroxyzine  .  Asthma   . Bipolar affective disorder [facility])    onogoing Dr Nicholaus Corolla treatment for the past 10 years  . Chronic fatigue disorder   . Depression   . GERD (gastroesophageal reflux disease)   . History of kidney stones    highschool  . History of recurrent UTIs    cystoscopy with Dr Barnie Del [facility]  . Iron deficiency anemia 02/23/2015  . Migraines    teenage onset with periods  . Postpartum care following cesarean delivery (8/11) 02/22/2015   Allergies  Allergen Reactions  . Zonisamide Nausea And Vomiting    Social History   Socioeconomic History  . Marital status: Married    Spouse name: Not on file  . Number of children: Not on file  . Years of education: Not on file  . Highest education level: Not on file  Occupational History  . Occupation: Print production planner and nanny  Social Needs  . Financial resource strain: Not on file  . Food insecurity:    Worry: Not on file    Inability: Not on file  . Transportation needs:    Medical: Not on file    Non-medical: Not on file  Tobacco Use  . Smoking status: Never Smoker  . Smokeless tobacco: Never Used  Substance and Sexual Activity  . Alcohol use: No    Alcohol/week: 4.0 standard drinks    Types: 4 Cans of beer per week  . Drug use:  No  . Sexual activity: Yes    Birth control/protection: Pill  Lifestyle  . Physical activity:    Days per week: Not on file    Minutes per session: Not on file  . Stress: Not on file  Relationships  . Social connections:    Talks on phone: Not on file    Gets together: Not on file    Attends religious service: Not on file    Active member of club or organization: Not on file    Attends meetings of clubs or organizations: Not on file    Relationship status: Not on file  Other Topics Concern  . Not on file  Social History Narrative   Grew up in [place]   Married 5 yrs   Occupation - prev occupation.  Preschool teacher and nanny   Never smoked.  No etoh no drugs    Vitals:     07/05/18 1035  BP: 120/70  Pulse: 93  Resp: 12  Temp: 98.5 F (36.9 C)  SpO2: 97%   Body mass index is 35.23 kg/m.    Physical Exam  Nursing note and vitals reviewed. Constitutional: She is oriented to person, place, and time. She appears well-developed. No distress.  HENT:  Head: Normocephalic and atraumatic.  Mouth/Throat: Oropharynx is clear and moist and mucous membranes are normal.  Eyes: Conjunctivae are normal.  Cardiovascular: Normal rate and regular rhythm.  Respiratory: Effort normal and breath sounds normal. No respiratory distress.  Musculoskeletal:        General: No edema.     Right elbow: She exhibits normal range of motion and no deformity.     Right wrist: She exhibits normal range of motion and no tenderness.     Comments: Nodular lesion dorsal aspect right forearm noted with flexed elbow. About 8 cm x 5 cm, not tender and no erythema. No defined borders.  Lymphadenopathy:    She has no cervical adenopathy.  Neurological: She is alert and oriented to person, place, and time.  Skin: Skin is warm. No rash noted. No erythema.  Psychiatric: She has a normal mood and affect.  Fairly groomed, good eye contact.    ASSESSMENT AND PLAN:  Katie [name] was seen today for lump on right arm.  Diagnoses and all orders for this visit:  Forearm mass, right We discussed possible etiologies,most likely benign. ? Lipoma. She would like lesion remove, surgery referral placed.  -     Ambulatory referral to General Surgery  Cough Lung auscultation negative for rales or wheezing. Reporting improvement. Explained that cough and congestion can last  Few more days. Instructed about warning signs. F/U as needed.     Return if symptoms worsen or fail to improve.      Hao Dion G. Martinique, MD  [facility]. [place] office.

## 2018-07-08 ENCOUNTER — Encounter: Payer: Self-pay | Admitting: Family Medicine

## 2018-11-04 ENCOUNTER — Other Ambulatory Visit: Payer: Self-pay | Admitting: Orthopedic Surgery

## 2018-11-04 ENCOUNTER — Other Ambulatory Visit: Payer: Self-pay

## 2018-11-04 ENCOUNTER — Encounter (HOSPITAL_BASED_OUTPATIENT_CLINIC_OR_DEPARTMENT_OTHER): Payer: Self-pay | Admitting: *Deleted

## 2018-11-05 ENCOUNTER — Ambulatory Visit (HOSPITAL_BASED_OUTPATIENT_CLINIC_OR_DEPARTMENT_OTHER): Payer: 59 | Admitting: Anesthesiology

## 2018-11-05 ENCOUNTER — Other Ambulatory Visit: Payer: Self-pay

## 2018-11-05 ENCOUNTER — Encounter (HOSPITAL_BASED_OUTPATIENT_CLINIC_OR_DEPARTMENT_OTHER)
Admission: RE | Disposition: A | Discharge status: Home / Self Care | Payer: Self-pay | Source: Home / Self Care | Attending: Orthopedic Surgery

## 2018-11-05 ENCOUNTER — Ambulatory Visit (HOSPITAL_COMMUNITY)
Admission: RE | Admit: 2018-11-05 | Discharge: 2018-11-05 | Disposition: A | Discharge status: Home / Self Care | Payer: 59 | Attending: Orthopedic Surgery | Admitting: Orthopedic Surgery

## 2018-11-05 ENCOUNTER — Encounter (HOSPITAL_BASED_OUTPATIENT_CLINIC_OR_DEPARTMENT_OTHER): Payer: Self-pay | Admitting: *Deleted

## 2018-11-05 DIAGNOSIS — G43909 Migraine, unspecified, not intractable, without status migrainosus: Secondary | ICD-10-CM | POA: Insufficient documentation

## 2018-11-05 DIAGNOSIS — Z803 Family history of malignant neoplasm of breast: Secondary | ICD-10-CM | POA: Insufficient documentation

## 2018-11-05 DIAGNOSIS — Z801 Family history of malignant neoplasm of trachea, bronchus and lung: Secondary | ICD-10-CM | POA: Diagnosis not present

## 2018-11-05 DIAGNOSIS — Z833 Family history of diabetes mellitus: Secondary | ICD-10-CM | POA: Insufficient documentation

## 2018-11-05 DIAGNOSIS — M797 Fibromyalgia: Secondary | ICD-10-CM | POA: Insufficient documentation

## 2018-11-05 DIAGNOSIS — R5382 Chronic fatigue, unspecified: Secondary | ICD-10-CM | POA: Diagnosis not present

## 2018-11-05 DIAGNOSIS — Z888 Allergy status to other drugs, medicaments and biological substances status: Secondary | ICD-10-CM | POA: Insufficient documentation

## 2018-11-05 DIAGNOSIS — Z811 Family history of alcohol abuse and dependence: Secondary | ICD-10-CM | POA: Diagnosis not present

## 2018-11-05 DIAGNOSIS — M6289 Other specified disorders of muscle: Secondary | ICD-10-CM | POA: Insufficient documentation

## 2018-11-05 DIAGNOSIS — R2231 Localized swelling, mass and lump, right upper limb: Secondary | ICD-10-CM | POA: Insufficient documentation

## 2018-11-05 DIAGNOSIS — K219 Gastro-esophageal reflux disease without esophagitis: Secondary | ICD-10-CM | POA: Diagnosis not present

## 2018-11-05 DIAGNOSIS — Z8249 Family history of ischemic heart disease and other diseases of the circulatory system: Secondary | ICD-10-CM | POA: Diagnosis not present

## 2018-11-05 DIAGNOSIS — D509 Iron deficiency anemia, unspecified: Secondary | ICD-10-CM | POA: Diagnosis not present

## 2018-11-05 DIAGNOSIS — Z87442 Personal history of urinary calculi: Secondary | ICD-10-CM | POA: Diagnosis not present

## 2018-11-05 DIAGNOSIS — F419 Anxiety disorder, unspecified: Secondary | ICD-10-CM | POA: Diagnosis not present

## 2018-11-05 DIAGNOSIS — Z8042 Family history of malignant neoplasm of prostate: Secondary | ICD-10-CM | POA: Diagnosis not present

## 2018-11-05 DIAGNOSIS — Z9049 Acquired absence of other specified parts of digestive tract: Secondary | ICD-10-CM | POA: Insufficient documentation

## 2018-11-05 DIAGNOSIS — F319 Bipolar disorder, unspecified: Secondary | ICD-10-CM | POA: Insufficient documentation

## 2018-11-05 DIAGNOSIS — Z79899 Other long term (current) drug therapy: Secondary | ICD-10-CM | POA: Diagnosis not present

## 2018-11-05 DIAGNOSIS — Z8261 Family history of arthritis: Secondary | ICD-10-CM | POA: Diagnosis not present

## 2018-11-05 DIAGNOSIS — J45909 Unspecified asthma, uncomplicated: Secondary | ICD-10-CM | POA: Insufficient documentation

## 2018-11-05 HISTORY — PX: MASS EXCISION: SHX2000

## 2018-11-05 LAB — POCT PREGNANCY, URINE: Preg Test, Ur: NEGATIVE

## 2018-11-05 SURGERY — EXCISION MASS
Anesthesia: General | Site: Arm Lower | Laterality: Right

## 2018-11-05 MED ORDER — LACTATED RINGERS IV SOLN
INTRAVENOUS | Status: DC
  Administered 2018-11-05: 09:00:00 via INTRAVENOUS

## 2018-11-05 MED ORDER — BUPIVACAINE HCL (PF) 0.5 % IJ SOLN
INTRAMUSCULAR | Status: AC
  Filled 2018-11-05: qty 30

## 2018-11-05 MED ORDER — ACETAMINOPHEN 500 MG PO TABS
1000.0000 mg | ORAL_TABLET | Freq: Once | ORAL | Status: AC
  Administered 2018-11-05: 08:00:00 1000 mg via ORAL

## 2018-11-05 MED ORDER — FENTANYL CITRATE (PF) 100 MCG/2ML IJ SOLN
INTRAMUSCULAR | Status: AC
  Filled 2018-11-05: qty 2

## 2018-11-05 MED ORDER — OXYCODONE-ACETAMINOPHEN 5-325 MG PO TABS: 1.0000 | ORAL_TABLET | ORAL | 0 refills | Status: AC | PRN

## 2018-11-05 MED ORDER — FENTANYL CITRATE (PF) 100 MCG/2ML IJ SOLN
50.0000 ug | INTRAMUSCULAR | Status: AC | PRN
  Administered 2018-11-05 (×3): 25 ug via INTRAVENOUS
  Administered 2018-11-05: 50 ug via INTRAVENOUS
  Administered 2018-11-05 (×3): 25 ug via INTRAVENOUS

## 2018-11-05 MED ORDER — ONDANSETRON HCL 4 MG/2ML IJ SOLN
INTRAMUSCULAR | Status: DC | PRN
  Administered 2018-11-05: 4 mg via INTRAVENOUS

## 2018-11-05 MED ORDER — ONDANSETRON HCL 4 MG/2ML IJ SOLN
INTRAMUSCULAR | Status: AC
  Filled 2018-11-05: qty 2

## 2018-11-05 MED ORDER — FENTANYL CITRATE (PF) 100 MCG/2ML IJ SOLN
25.0000 ug | INTRAMUSCULAR | Status: DC | PRN
  Administered 2018-11-05: 50 ug via INTRAVENOUS

## 2018-11-05 MED ORDER — BUPIVACAINE HCL (PF) 0.25 % IJ SOLN
INTRAMUSCULAR | Status: AC
  Filled 2018-11-05: qty 30

## 2018-11-05 MED ORDER — DEXAMETHASONE SODIUM PHOSPHATE 10 MG/ML IJ SOLN
INTRAMUSCULAR | Status: AC
  Filled 2018-11-05: qty 1

## 2018-11-05 MED ORDER — CEFAZOLIN SODIUM-DEXTROSE 2-3 GM-%(50ML) IV SOLR
INTRAVENOUS | Status: DC | PRN
  Administered 2018-11-05: 2 g via INTRAVENOUS

## 2018-11-05 MED ORDER — SCOPOLAMINE 1 MG/3DAYS TD PT72: 1.0000 | MEDICATED_PATCH | Freq: Once | TRANSDERMAL | Status: DC | PRN

## 2018-11-05 MED ORDER — CHLORHEXIDINE GLUCONATE 4 % EX LIQD: 60.0000 mL | Freq: Once | CUTANEOUS | Status: DC

## 2018-11-05 MED ORDER — ACETAMINOPHEN 500 MG PO TABS
ORAL_TABLET | ORAL | Status: AC
  Filled 2018-11-05: qty 2

## 2018-11-05 MED ORDER — MIDAZOLAM HCL 2 MG/2ML IJ SOLN: 1.0000 mg | INTRAMUSCULAR | Status: DC | PRN

## 2018-11-05 MED ORDER — DEXAMETHASONE SODIUM PHOSPHATE 10 MG/ML IJ SOLN
INTRAMUSCULAR | Status: DC | PRN
  Administered 2018-11-05: 5 mg via INTRAVENOUS

## 2018-11-05 MED ORDER — CEFAZOLIN SODIUM-DEXTROSE 2-4 GM/100ML-% IV SOLN: 2.0000 g | INTRAVENOUS | Status: DC

## 2018-11-05 MED ORDER — BUPIVACAINE HCL (PF) 0.5 % IJ SOLN
INTRAMUSCULAR | Status: DC | PRN
  Administered 2018-11-05: 10 mL

## 2018-11-05 MED ORDER — PROPOFOL 10 MG/ML IV BOLUS
INTRAVENOUS | Status: DC | PRN
  Administered 2018-11-05: 200 mg via INTRAVENOUS
  Administered 2018-11-05 (×2): 100 mg via INTRAVENOUS

## 2018-11-05 MED ORDER — LIDOCAINE HCL (CARDIAC) PF 100 MG/5ML IV SOSY
PREFILLED_SYRINGE | INTRAVENOUS | Status: DC | PRN
  Administered 2018-11-05: 100 mg via INTRAVENOUS

## 2018-11-05 MED ORDER — MIDAZOLAM HCL 2 MG/2ML IJ SOLN
INTRAMUSCULAR | Status: AC
  Filled 2018-11-05: qty 2

## 2018-11-05 MED ORDER — PROPOFOL 10 MG/ML IV BOLUS
INTRAVENOUS | Status: AC
  Filled 2018-11-05: qty 40

## 2018-11-05 SURGICAL SUPPLY — 59 items
APL SKNCLS STERI-STRIP NONHPOA (GAUZE/BANDAGES/DRESSINGS) ×1
BAG DECANTER FOR FLEXI CONT (MISCELLANEOUS) IMPLANT
BANDAGE ACE 3X5.8 VEL STRL LF (GAUZE/BANDAGES/DRESSINGS) IMPLANT
BANDAGE ACE 4X5 VEL STRL LF (GAUZE/BANDAGES/DRESSINGS) ×3 IMPLANT
BENZOIN TINCTURE PRP APPL 2/3 (GAUZE/BANDAGES/DRESSINGS) ×2 IMPLANT
BLADE SURG 15 STRL LF DISP TIS (BLADE) ×1 IMPLANT
BLADE SURG 15 STRL SS (BLADE) ×3
BNDG CMPR 9X4 STRL LF SNTH (GAUZE/BANDAGES/DRESSINGS) ×1
BNDG ESMARK 4X9 LF (GAUZE/BANDAGES/DRESSINGS) ×2 IMPLANT
BNDG GAUZE ELAST 4 BULKY (GAUZE/BANDAGES/DRESSINGS) ×3 IMPLANT
CLOSURE WOUND 1/2 X4 (GAUZE/BANDAGES/DRESSINGS) ×1
CORD BIPOLAR FORCEPS 12FT (ELECTRODE) ×3 IMPLANT
COVER BACK TABLE REUSABLE LG (DRAPES) ×3 IMPLANT
COVER WAND RF STERILE (DRAPES) IMPLANT
CUFF TOURN SGL QUICK 18X4 (TOURNIQUET CUFF) ×2 IMPLANT
DECANTER SPIKE VIAL GLASS SM (MISCELLANEOUS) IMPLANT
DRAPE EXTREMITY T 121X128X90 (DISPOSABLE) ×3 IMPLANT
DRAPE SURG 17X23 STRL (DRAPES) ×3 IMPLANT
DURAPREP 26ML APPLICATOR (WOUND CARE) ×1 IMPLANT
GAUZE SPONGE 4X4 12PLY STRL (GAUZE/BANDAGES/DRESSINGS) ×3 IMPLANT
GAUZE XEROFORM 1X8 LF (GAUZE/BANDAGES/DRESSINGS) IMPLANT
GLOVE BIO SURGEON STRL SZ 6.5 (GLOVE) ×1 IMPLANT
GLOVE BIO SURGEONS STRL SZ 6.5 (GLOVE) ×1
GLOVE BIOGEL PI IND STRL 6.5 (GLOVE) IMPLANT
GLOVE BIOGEL PI IND STRL 7.0 (GLOVE) IMPLANT
GLOVE BIOGEL PI INDICATOR 6.5 (GLOVE) ×2
GLOVE BIOGEL PI INDICATOR 7.0 (GLOVE) ×2
GLOVE ECLIPSE 6.5 STRL STRAW (GLOVE) ×2 IMPLANT
GLOVE SURG SYN 8.0 (GLOVE) ×6 IMPLANT
GLOVE SURG SYN 8.0 PF PI (GLOVE) ×2 IMPLANT
GOWN STRL REIN XL XLG (GOWN DISPOSABLE) ×4 IMPLANT
GOWN STRL REUS W/ TWL LRG LVL3 (GOWN DISPOSABLE) ×1 IMPLANT
GOWN STRL REUS W/TWL LRG LVL3 (GOWN DISPOSABLE) ×6
NDL HYPO 25X1 1.5 SAFETY (NEEDLE) IMPLANT
NEEDLE HYPO 25X1 1.5 SAFETY (NEEDLE) ×3 IMPLANT
NS IRRIG 1000ML POUR BTL (IV SOLUTION) ×3 IMPLANT
PACK BASIN DAY SURGERY FS (CUSTOM PROCEDURE TRAY) ×3 IMPLANT
PAD CAST 3X4 CTTN HI CHSV (CAST SUPPLIES) ×1 IMPLANT
PADDING CAST COTTON 3X4 STRL (CAST SUPPLIES) ×3
SHEET MEDIUM DRAPE 40X70 STRL (DRAPES) ×3 IMPLANT
SPLINT PLASTER CAST XFAST 4X15 (CAST SUPPLIES) ×5 IMPLANT
SPLINT PLASTER XTRA FAST SET 4 (CAST SUPPLIES)
STOCKINETTE 4X48 STRL (DRAPES) ×3 IMPLANT
STRIP CLOSURE SKIN 1/2X4 (GAUZE/BANDAGES/DRESSINGS) ×1 IMPLANT
SUT ETHILON 5 0 PS 2 18 (SUTURE) IMPLANT
SUT PROLENE 3 0 PS 2 (SUTURE) ×2 IMPLANT
SUT VIC AB 0 SH 27 (SUTURE) ×2 IMPLANT
SUT VIC AB 2-0 SH 27 (SUTURE) ×3
SUT VIC AB 2-0 SH 27XBRD (SUTURE) IMPLANT
SUT VIC AB 4-0 P-3 18XBRD (SUTURE) IMPLANT
SUT VIC AB 4-0 P3 18 (SUTURE)
SUT VICRYL 4-0 PS2 18IN ABS (SUTURE) ×2 IMPLANT
SUT VICRYL RAPIDE 4-0 (SUTURE) IMPLANT
SUT VICRYL RAPIDE 4/0 PS 2 (SUTURE) IMPLANT
SYR 10ML LL (SYRINGE) ×2 IMPLANT
SYR BULB 3OZ (MISCELLANEOUS) ×3 IMPLANT
TOWEL GREEN STERILE FF (TOWEL DISPOSABLE) ×3 IMPLANT
TRAY DSU PREP LF (CUSTOM PROCEDURE TRAY) ×2 IMPLANT
UNDERPAD 30X30 (UNDERPADS AND DIAPERS) ×3 IMPLANT

## 2018-11-05 NOTE — Op Note (Signed)
Please see dictated report [phone]

## 2018-11-05 NOTE — Discharge Instructions (Signed)
° ° °  Do not take any tylenol until after 2:15 pm today.  Post Anesthesia Home Care Instructions  Activity: Get plenty of rest for the remainder of the day. A responsible individual must stay with you for 24 hours following the procedure.  For the next 24 hours, DO NOT: -Drive a car -Paediatric nurse -Drink alcoholic beverages -Take any medication unless instructed by your physician -Make any legal decisions or sign important papers.  Meals: Start with liquid foods such as gelatin or soup. Progress to regular foods as tolerated. Avoid greasy, spicy, heavy foods. If nausea and/or vomiting occur, drink only clear liquids until the nausea and/or vomiting subsides. Call your physician if vomiting continues.  Special Instructions/Symptoms: Your throat may feel dry or sore from the anesthesia or the breathing tube placed in your throat during surgery. If this causes discomfort, gargle with warm salt water. The discomfort should disappear within 24 hours.

## 2018-11-05 NOTE — OR Nursing (Signed)
50 mcg Fentanly was found at bedside. Davine and I wasted the medication in medication box but not able to waste in in pyxis.  Could not locate patient's name in pyxis. Wasted medication at 6pm tonight

## 2018-11-05 NOTE — Transfer of Care (Signed)
Immediate Anesthesia Transfer of Care Note  Patient: Katie [name]  Procedure(s) Performed: EXCISION MASS RIGHT FOREARM (Right Arm Lower)  Patient Location: PACU  Anesthesia Type:General  Level of Consciousness: awake, alert  and oriented  Airway & Oxygen Therapy: Patient Spontanous Breathing and Patient connected to face mask oxygen  Post-op Assessment: Report given to RN and Post -op Vital signs reviewed and stable  Post vital signs: Reviewed and stable  Last Vitals:  Vitals Value Taken Time  BP    Temp    Pulse 74 11/05/2018 10:34 AM  Resp    SpO2 92 % 11/05/2018 10:34 AM  Vitals shown include unvalidated device data.  Last Pain:  Vitals:   11/05/18 0836  TempSrc: Oral  PainSc: 0-No pain      Patients Stated Pain Goal: 5 (40/34/74 2595)  Complications: No apparent anesthesia complications

## 2018-11-05 NOTE — Anesthesia Postprocedure Evaluation (Signed)
Anesthesia Post Note  Patient: Katie [name]  Procedure(s) Performed: EXCISION MASS RIGHT FOREARM (Right Arm Lower)     Patient location during evaluation: PACU Anesthesia Type: General Level of consciousness: awake and alert Pain management: pain level controlled Vital Signs Assessment: post-procedure vital signs reviewed and stable Respiratory status: spontaneous breathing, nonlabored ventilation, respiratory function stable and patient connected to nasal cannula oxygen Cardiovascular status: blood pressure returned to baseline and stable Postop Assessment: no apparent nausea or vomiting Anesthetic complications: no    Last Vitals:  Vitals:   11/05/18 1128 11/05/18 1130  BP:  114/82  Pulse: 91 91  Resp: 19 16  Temp:    SpO2: 96% 97%    Last Pain:  Vitals:   11/05/18 1145  TempSrc:   PainSc: (P) 3                  Velora Horstman L Raylen Ken

## 2018-11-05 NOTE — Anesthesia Preprocedure Evaluation (Addendum)
Anesthesia Evaluation  Patient identified by MRN, date of birth, ID band Patient awake    Reviewed: Allergy & Precautions, NPO status , Patient's Chart, lab work & pertinent test results  Airway Mallampati: III  TM Distance: >3 FB Neck ROM: Full  Mouth opening: Limited Mouth Opening  Dental no notable dental hx. (+) Teeth Intact, Dental Advisory Given   Pulmonary asthma ,    Pulmonary exam normal breath sounds clear to auscultation       Cardiovascular negative cardio ROS Normal cardiovascular exam Rhythm:Regular Rate:Normal     Neuro/Psych  Headaches, PSYCHIATRIC DISORDERS Anxiety Depression Bipolar Disorder    GI/Hepatic Neg liver ROS, GERD  Medicated,  Endo/Other  negative endocrine ROS  Renal/GU negative Renal ROS  negative genitourinary   Musculoskeletal  (+) Fibromyalgia -  Abdominal   Peds  Hematology  (+) Blood dyscrasia, anemia ,   Anesthesia Other Findings   Reproductive/Obstetrics negative OB ROS                            Anesthesia Physical Anesthesia Plan  ASA: II  Anesthesia Plan: General   Post-op Pain Management:    Induction: Intravenous  PONV Risk Score and Plan: 3 and Ondansetron, Dexamethasone and Midazolam  Airway Management Planned: LMA  Additional Equipment:   Intra-op Plan:   Post-operative Plan: Extubation in OR  Informed Consent: I have reviewed the patients History and Physical, chart, labs and discussed the procedure including the risks, benefits and alternatives for the proposed anesthesia with the patient or authorized representative who has indicated his/her understanding and acceptance.     Dental advisory given  Plan Discussed with: CRNA  Anesthesia Plan Comments:         Anesthesia Quick Evaluation

## 2018-11-05 NOTE — Progress Notes (Signed)
Witnessed the 35mcg waste in med bin .

## 2018-11-05 NOTE — Anesthesia Procedure Notes (Signed)
Procedure Name: LMA Insertion Performed by: Kimyetta Flott M, CRNA Pre-anesthesia Checklist: Patient identified, Emergency Drugs available, Suction available, Patient being monitored and Timeout performed Patient Re-evaluated:Patient Re-evaluated prior to induction Oxygen Delivery Method: Circle system utilized Preoxygenation: Pre-oxygenation with 100% oxygen Induction Type: IV induction LMA: LMA inserted LMA Size: 4.0 Tube type: Oral Number of attempts: 1 Placement Confirmation: positive ETCO2,  CO2 detector and breath sounds checked- equal and bilateral Tube secured with: Tape Dental Injury: Teeth and Oropharynx as per pre-operative assessment        

## 2018-11-05 NOTE — H&P (Signed)
Katie [name] is an 38 y.o. female.   Chief Complaint: Painful right forearm mass HPI: Patient is a very pleasant 38 year old female status post excision of intramuscular hemangioma 11 years ago now with recurrent mass along her previous surgical site which is painful and enlarging recently  Past Medical History:  Diagnosis Date  . Allergy   . Anxiety    currently on luvox, hydroxyzine  . Asthma   . Bipolar affective disorder [facility])    onogoing Dr Nicholaus Corolla treatment for the past 10 years  . Chronic fatigue disorder   . Depression   . GERD (gastroesophageal reflux disease)   . History of kidney stones    highschool  . Iron deficiency anemia 02/23/2015  . Migraines    teenage onset with periods    Past Surgical History:  Procedure Laterality Date  . ADENOIDECTOMY     1990  . CESAREAN SECTION N/A 02/22/2015   Procedure: CESAREAN SECTION;  Surgeon: Brien Few, MD;  Location: [facility];  Service: Obstetrics;  Laterality: N/A;  . CHOLECYSTECTOMY    . HEMANGIOMA EXCISION     2009 removal from right ar  . KNEE SURGERY     left knee torn cartilage removed 1994    Family History  Problem Relation Age of Onset  . Osteoarthritis Mother   . Lymphoma Mother        Non-Hodgkin's lymphoma  . Hyperlipidemia Mother   . Hypertension Mother   . Diabetes Mother        type 2  . Cancer Mother        breast  . Osteoarthritis Father   . Other Father        Depression.anxiety  . Alcohol abuse Paternal Grandmother   . Alcohol abuse Paternal Grandfather   . Lung cancer Paternal Grandfather   . Alcohol abuse Maternal Grandfather   . Prostate cancer Maternal Grandfather   . Lung cancer Maternal Grandmother   . Diabetes Maternal Grandmother   . Diabetes Maternal Aunt    Social History:  reports that she has never smoked. She has never used smokeless tobacco. She reports that she does not drink alcohol or use drugs.  Allergies:  Allergies  Allergen Reactions  . Zonisamide Nausea And  Vomiting    Medications Prior to Admission  Medication Sig Dispense Refill  . fluvoxaMINE (LUVOX) 100 MG tablet TAKE ONE TABLET BY MOUTH TWICE DAILY    . lithium 600 MG capsule Take 600 mg by mouth at bedtime.    Marland Kitchen lithium carbonate 300 MG capsule Take 300 mg by mouth daily.     . Multiple Vitamin (MULTIVITAMIN) capsule Take 1 capsule by mouth daily.    Marland Kitchen omeprazole (PRILOSEC) 40 MG capsule Take 40 mg by mouth daily.    . traZODone (DESYREL) 100 MG tablet Take 100 mg by mouth at bedtime.      Results for orders placed or performed during the hospital encounter of 11/05/18 (from the past 48 hour(s))  Pregnancy, urine POC     Status: None   Collection Time: 11/05/18  8:03 AM  Result Value Ref Range   Preg Test, Ur NEGATIVE NEGATIVE    Comment:        THE SENSITIVITY OF THIS METHODOLOGY IS >24 mIU/mL    No results found.  Review of Systems  All other systems reviewed and are negative.   Blood pressure 121/65, pulse 94, temperature 99 F (37.2 C), temperature source Oral, resp. rate 17, height 5\' 4"  (1.626  m), weight 90 kg, last menstrual period 10/13/2018, not currently breastfeeding. Physical Exam  Constitutional: She is oriented to person, place, and time. She appears well-developed and well-nourished.  HENT:  Head: Normocephalic and atraumatic.  Neck: Normal range of motion.  Cardiovascular: Normal rate.  Respiratory: Effort normal.  Musculoskeletal:     Right forearm: She exhibits tenderness and swelling.     Comments: Right proximal forearm subcutaneous enlarging and painful mass  Neurological: She is alert and oriented to person, place, and time.  Skin: Skin is warm.  Psychiatric: She has a normal mood and affect. Her behavior is normal. Judgment and thought content normal.     Assessment/Plan 38 year old female with enlarging painful mass proximal forearm on the right.  Recommend excisional biopsy as an outpatient.  Patient understands the risks and benefits and  wishes to proceed  Schuyler Amor, MD 11/05/2018, 8:49 AM

## 2018-11-05 NOTE — Op Note (Signed)
NAME: Katie [name], HATCHER MEDICAL RECORD OQ:[id number] ACCOUNT [id number] DATE OF BIRTH:12/01/1980 FACILITY: [facility] LOCATION: [facility] PHYSICIAN:Havanna Groner A. Burney Gauze, MD  OPERATIVE REPORT  DATE OF PROCEDURE:  11/05/2018  PREOPERATIVE DIAGNOSIS:  Proximal forearm mass.  POSTOPERATIVE DIAGNOSIS:  Proximal forearm mass.  PROCEDURE:  Excisional biopsy of mass, proximal forearm and repair of herniated muscle.  SURGEON:  Charlotte Crumb, MD  ASSISTANT:  None.  ANESTHESIA:  General.  COMPLICATIONS:  No complications.  DRAINS:  No drains.  DESCRIPTION OF PROCEDURE:  The patient was taken to the operating suite after induction of adequate general anesthetic.  The right upper extremity was prepped and draped in the usual sterile fashion.  An Esmarch was used to exsanguinate the limb and the  tourniquet was inflated to 250 mmHg.  At this point in time, incision was made just ulnar and parallel to a previous incision in the proximal forearm along the subcutaneous border of the ulna from an intramuscular hemangioma excision in 2009.  Dissection  was carried down through the skin and subcutaneous tissues.  Once this was done, there was a 5 cm x 3 cm area of muscle that had appeared to have herniated through the fascia with some underlying fat as well.  We carefully biopsied the muscle and the  underlying fat and sent this for pathologic confirmation.  The muscle was pushed through the fascial layer and the fascia was repaired using interrupted 0 Vicryl sutures followed by running locked 2-0 Vicryl suture.  The wound was then thoroughly  irrigated and loosely closed in layers of 4-0 Vicryl subcutaneously and a 3-0 Prolene subcuticular stitch on the skin.  Steri-Strips, 4 x 4 fluffs and a compressive bandage was applied.    The patient tolerated this procedure well and went to recovery room in stable fashion.  AN/NUANCE  D:11/05/2018 T:11/05/2018 JOB:[id number]

## 2018-11-08 ENCOUNTER — Encounter (HOSPITAL_BASED_OUTPATIENT_CLINIC_OR_DEPARTMENT_OTHER): Payer: Self-pay | Admitting: Orthopedic Surgery

## 2019-09-06 ENCOUNTER — Other Ambulatory Visit: Payer: Self-pay

## 2019-09-07 ENCOUNTER — Encounter: Payer: Self-pay | Admitting: Family Medicine

## 2019-09-07 ENCOUNTER — Ambulatory Visit (INDEPENDENT_AMBULATORY_CARE_PROVIDER_SITE_OTHER): Payer: 59 | Admitting: Family Medicine

## 2019-09-07 VITALS — BP 126/70 | HR 83 | Resp 12 | Ht 64.0 in | Wt 203.0 lb

## 2019-09-07 DIAGNOSIS — E559 Vitamin D deficiency, unspecified: Secondary | ICD-10-CM

## 2019-09-07 DIAGNOSIS — Z13228 Encounter for screening for other metabolic disorders: Secondary | ICD-10-CM | POA: Diagnosis not present

## 2019-09-07 DIAGNOSIS — Z1329 Encounter for screening for other suspected endocrine disorder: Secondary | ICD-10-CM

## 2019-09-07 DIAGNOSIS — F3170 Bipolar disorder, currently in remission, most recent episode unspecified: Secondary | ICD-10-CM | POA: Diagnosis not present

## 2019-09-07 DIAGNOSIS — Z13220 Encounter for screening for lipoid disorders: Secondary | ICD-10-CM

## 2019-09-07 DIAGNOSIS — E669 Obesity, unspecified: Secondary | ICD-10-CM

## 2019-09-07 DIAGNOSIS — D509 Iron deficiency anemia, unspecified: Secondary | ICD-10-CM

## 2019-09-07 DIAGNOSIS — E0789 Other specified disorders of thyroid: Secondary | ICD-10-CM

## 2019-09-07 DIAGNOSIS — R5383 Other fatigue: Secondary | ICD-10-CM

## 2019-09-07 DIAGNOSIS — Z130 Encounter for screening for diseases of the blood and blood-forming organs and certain disorders involving the immune mechanism: Secondary | ICD-10-CM | POA: Diagnosis not present

## 2019-09-07 DIAGNOSIS — Z0000 Encounter for general adult medical examination without abnormal findings: Secondary | ICD-10-CM

## 2019-09-07 LAB — LIPID PANEL
Cholesterol: 224 mg/dL — ABNORMAL HIGH (ref 0–200)
HDL: 36.2 mg/dL — ABNORMAL LOW (ref >39.00)
Total CHOL/HDL Ratio: 6
Triglycerides: 519 mg/dL — ABNORMAL HIGH (ref 0.0–149.0)

## 2019-09-07 LAB — CBC WITH DIFFERENTIAL/PLATELET
Basophils Absolute: 0 10*3/uL (ref 0.0–0.1)
Basophils Relative: 0.5 % (ref 0.0–3.0)
Eosinophils Absolute: 0.2 10*3/uL (ref 0.0–0.7)
Eosinophils Relative: 2.9 % (ref 0.0–5.0)
HCT: 39.3 % (ref 36.0–46.0)
Hemoglobin: 13.3 g/dL (ref 12.0–15.0)
Lymphocytes Relative: 23.6 % (ref 12.0–46.0)
Lymphs Abs: 1.7 10*3/uL (ref 0.7–4.0)
MCHC: 33.9 g/dL (ref 30.0–36.0)
MCV: 88.1 fl (ref 78.0–100.0)
Monocytes Absolute: 0.5 10*3/uL (ref 0.1–1.0)
Monocytes Relative: 6.9 % (ref 3.0–12.0)
Neutro Abs: 4.7 10*3/uL (ref 1.4–7.7)
Neutrophils Relative %: 66.1 % (ref 43.0–77.0)
Platelets: 316 10*3/uL (ref 150.0–400.0)
RBC: 4.46 Mil/uL (ref 3.87–5.11)
RDW: 13.8 % (ref 11.5–15.5)
WBC: 7.1 10*3/uL (ref 4.0–10.5)

## 2019-09-07 LAB — VITAMIN D 25 HYDROXY (VIT D DEFICIENCY, FRACTURES): VITD: 10.42 ng/mL — ABNORMAL LOW (ref 30.00–100.00)

## 2019-09-07 LAB — T3, FREE: T3, Free: 3.4 pg/mL (ref 2.3–4.2)

## 2019-09-07 LAB — LDL CHOLESTEROL, DIRECT: Direct LDL: 113 mg/dL

## 2019-09-07 LAB — TSH: TSH: 0.96 u[IU]/mL (ref 0.35–4.50)

## 2019-09-07 LAB — T4, FREE: Free T4: 0.63 ng/dL (ref 0.60–1.60)

## 2019-09-07 NOTE — Assessment & Plan Note (Signed)
Some of the symptoms she reported today could be related to this problem. Following with psychiatrist.

## 2019-09-07 NOTE — Patient Instructions (Addendum)
Today you have you routine preventive visit.  Routine general medical examination at a health care facility  Bipolar affective disorder in remission [facility]), Chronic  Fatigue, unspecified type - Plan: TSH, CBC with Differential/Platelet, T3, free, T4, free  Screening for lipoid disorders - Plan: Lipid panel  Screening for endocrine, metabolic and immunity disorder - Plan: Hemoglobin A1c  Iron deficiency anemia, unspecified iron deficiency anemia type - Plan: CBC with Differential/Platelet  Vitamin D deficiency, unspecified - Plan: VITAMIN D 25 Hydroxy (Vit-D Deficiency, Fractures)  Enlarged thyroid gland - Plan: US THYROID  Obesity, Class I, BMI 30-34.9   Please be sure medication list is accurate. If a new problem present, please set up appointment sooner than planned today.  At least 150 minutes of moderate exercise per week, daily brisk walking for 15-30 min is a good exercise option. Healthy diet low in saturated (animal) fats and sweets and consisting of fresh fruits and vegetables, lean meats such as fish and white chicken and whole grains.  These are some of recommendations for screening depending of age and risk factors:   - Vaccines:  Tdap vaccine every 10 years.  Shingles vaccine recommended at age 32, could be given after 39 years of age but not sure about insurance coverage.   Pneumonia vaccines:  Pneumovax at 78. Sometimes Pneumovax is giving earlier if history of smoking, lung disease,diabetes,kidney disease among some.    Screening for diabetes at age 21 and every 3 years.  Cervical cancer prevention:  Pap smear starts at 39 years of age and continues periodically until 39 years old in low risk women. Pap smear every 3 years between 32 and 56 years old. Pap smear every 3-5 years between women 44 and older if pap smear negative and HPV screening negative.   -Breast cancer: Mammogram: There is disagreement between experts about when to start screening in low  risk asymptomatic female but recent recommendations are to start screening at 72 and not later than 39 years old , every 1-2 years and after 39 yo q 2 years. Screening is recommended until 39 years old but some women can continue screening depending of healthy issues.  Colon cancer screening: starts at 39 years old until 39 years old.  Cholesterol disorder screening at age 5 and every 3 years.  Also recommended:  1. Dental visit- Brush and floss your teeth twice daily; visit your dentist twice a year. 2. Eye doctor- Get an eye exam at least every 2 years. 3. Helmet use- Always wear a helmet when riding a bicycle, motorcycle, rollerblading or skateboarding. 4. Safe sex- If you may be exposed to sexually transmitted infections, use a condom. 5. Seat belts- Seat belts can save your live; always wear one. 6. Smoke/Carbon Monoxide detectors- These detectors need to be installed on the appropriate level of your home. Replace batteries at least once a year. 7. Skin cancer- When out in the sun please cover up and use sunscreen 15 SPF or higher. 8. Violence- If anyone is threatening or hurting you, please tell your healthcare provider.  9. Drink alcohol in moderation- Limit alcohol intake to one drink or less per day. Never drink and drive.

## 2019-09-07 NOTE — Assessment & Plan Note (Addendum)
Could be contributing factor for fatigue. Further recommendation will be given according to CBC results.

## 2019-09-07 NOTE — Progress Notes (Signed)
HPI:   [name] is a 39 y.o. female, who is here today for her routine physical.  Last CPE: > a year ago. Regular exercise 3 or more time per week: Walks most days for 20-30 min. Following a healthful diet: She is trying to eat healthier. She lives with her husband and child.  Chronic medical problems: Insomnia,bipolar disorder,OCD,obesity, and vit D deficiency among some.  Pap smear: 03/2019. Mirena 03/2015  Immunization History  Administered Date(s) Administered  . DTaP 07/15/2006  . Influenza,inj,Quad PF,6+ Mos 03/21/2013, 03/27/2018  . Influenza-Unspecified 05/13/2015  . Tdap 07/15/2006    Mammogram: She had one in 07/2019 because some breast discomfort and FHx of breast cancer. Colonoscopy: N/A DEXA: N/A  She has some concerns today.  Fatigue:She has had problem for years but getting worse for the past few months.. She is concerned about this problem being related with thyroid disease.  Hx of iron def anemia, she is not on iron supplementation.  Lab Results  Component Value Date   WBC 13.2 (H) 02/23/2015   HGB 10.4 (L) 02/23/2015   HCT 31.8 (L) 02/23/2015   MCV 88.1 02/23/2015   PLT 211 02/23/2015    Vitamin D deficiency: She is now on vitamin D supplementation.  C/O nsomnia,irritability. Sleep about 6 hours if she is "lucky." "Tired all the time." She does not feel rested when she first gets up in the morning. Reporting Hx of goiter. Thyroid US in 09/2015 otherwise normal.  She is also concerned about difficulty losing weight. Trying.Intermitent fasting, still gaining wt.  Bipolar disorder: She has been on Lithium, last TSH 2 years ago. She would like to have thyroid panel done. She follows with psychiatrist q 3 months. Last visit her antidepressant was increased,felt worse,so decreased dose back   Review of Systems  Constitutional: Negative for appetite change and fever.  HENT: Negative for hearing loss, mouth sores and sore throat.    Eyes: Negative for photophobia and visual disturbance.  Respiratory: Negative for cough, shortness of breath and wheezing.   Cardiovascular: Negative for chest pain and leg swelling.  Gastrointestinal: Negative for abdominal pain, nausea and vomiting.       No changes in bowel habits.  Endocrine: Negative for cold intolerance, heat intolerance, polydipsia, polyphagia and polyuria.  Genitourinary: Negative for decreased urine volume, dysuria, hematuria, vaginal bleeding and vaginal discharge.  Musculoskeletal: Negative for gait problem and joint swelling.  Skin: Negative for color change and rash.  Allergic/Immunologic: Positive for environmental allergies.  Neurological: Negative for syncope, weakness and headaches.  Psychiatric/Behavioral: Positive for sleep disturbance. Negative for confusion and hallucinations. The patient is nervous/anxious.   All other systems reviewed and are negative.   Current Outpatient Medications on File Prior to Visit  Medication Sig Dispense Refill  . fluvoxaMINE (LUVOX) 100 MG tablet TAKE ONE TABLET BY MOUTH TWICE DAILY    . lithium 600 MG capsule Take 600 mg by mouth at bedtime.    Marland Kitchen lithium carbonate 300 MG capsule Take 300 mg by mouth daily.     . Multiple Vitamin (MULTIVITAMIN) capsule Take 1 capsule by mouth daily.    Marland Kitchen omeprazole (PRILOSEC) 40 MG capsule Take 40 mg by mouth daily.    Marland Kitchen oxyCODONE-acetaminophen (PERCOCET) 5-325 MG tablet Take 1 tablet by mouth every 4 (four) hours as needed for severe pain. 20 tablet 0  . traZODone (DESYREL) 100 MG tablet Take 100 mg by mouth at bedtime.     No current facility-administered  medications on file prior to visit.     Past Medical History:  Diagnosis Date  . Allergy   . Anxiety    currently on luvox, hydroxyzine  . Asthma   . Bipolar affective disorder [facility])    onogoing Dr Nicholaus Corolla treatment for the past 10 years  . Chronic fatigue disorder   . Depression   . GERD (gastroesophageal reflux disease)    . History of kidney stones    highschool  . Iron deficiency anemia 02/23/2015  . Migraines    teenage onset with periods    Past Surgical History:  Procedure Laterality Date  . ADENOIDECTOMY     1990  . CESAREAN SECTION N/A 02/22/2015   Procedure: CESAREAN SECTION;  Surgeon: Brien Few, MD;  Location: [facility];  Service: Obstetrics;  Laterality: N/A;  . CHOLECYSTECTOMY    . HEMANGIOMA EXCISION     2009 removal from right ar  . KNEE SURGERY     left knee torn cartilage removed 1994  . MASS EXCISION Right 11/05/2018   Procedure: EXCISION MASS RIGHT FOREARM;  Surgeon: Charlotte Crumb, MD;  Location: [place];  Service: Orthopedics;  Laterality: Right;    Allergies  Allergen Reactions  . Zonisamide Nausea And Vomiting    Family History  Problem Relation Age of Onset  . Osteoarthritis Mother   . Lymphoma Mother        Non-Hodgkin's lymphoma  . Hyperlipidemia Mother   . Hypertension Mother   . Diabetes Mother        type 2  . Cancer Mother        breast  . Osteoarthritis Father   . Other Father        Depression.anxiety  . Alcohol abuse Paternal Grandmother   . Alcohol abuse Paternal Grandfather   . Lung cancer Paternal Grandfather   . Alcohol abuse Maternal Grandfather   . Prostate cancer Maternal Grandfather   . Lung cancer Maternal Grandmother   . Diabetes Maternal Grandmother   . Diabetes Maternal Aunt     Social History   Socioeconomic History  . Marital status: Married    Spouse name: Not on file  . Number of children: Not on file  . Years of education: Not on file  . Highest education level: Not on file  Occupational History  . Occupation: Print production planner and nanny  Tobacco Use  . Smoking status: Never Smoker  . Smokeless tobacco: Never Used  Substance and Sexual Activity  . Alcohol use: No    Alcohol/week: 4.0 standard drinks    Types: 4 Cans of beer per week  . Drug use: No  . Sexual activity: Yes    Birth  control/protection: I.U.D.  Other Topics Concern  . Not on file  Social History Narrative   Grew up in [place]   Married 5 yrs   Occupation - prev occupation.  Preschool teacher and nanny   Never smoked.  No etoh no drugs   Social Determinants of Health   Financial Resource Strain:   . Difficulty of Paying Living Expenses: Not on file  Food Insecurity:   . Worried About Charity fundraiser in the Last Year: Not on file  . Ran Out of Food in the Last Year: Not on file  Transportation Needs:   . Lack of Transportation (Medical): Not on file  . Lack of Transportation (Non-Medical): Not on file  Physical Activity:   . Days of Exercise per Week: Not on file  .  Minutes of Exercise per Session: Not on file  Stress:   . Feeling of Stress : Not on file  Social Connections:   . Frequency of Communication with Friends and Family: Not on file  . Frequency of Social Gatherings with Friends and Family: Not on file  . Attends Religious Services: Not on file  . Active Member of Clubs or Organizations: Not on file  . Attends Archivist Meetings: Not on file  . Marital Status: Not on file    Vitals:   09/07/19 1028  BP: 126/70  Pulse: 83  Resp: 12  SpO2: 97%   Body mass index is 34.84 kg/m.   Wt Readings from Last 3 Encounters:  09/07/19 203 lb (92.1 kg)  11/05/18 198 lb 6.6 oz (90 kg)  07/05/18 205 lb 4 oz (93.1 kg)    Physical Exam  Nursing note and vitals reviewed. Constitutional: She is oriented to person, place, and time. She appears well-developed. No distress.  HENT:  Head: Normocephalic and atraumatic.  Right Ear: Hearing, tympanic membrane, external ear and ear canal normal.  Left Ear: Hearing, tympanic membrane, external ear and ear canal normal.  Mouth/Throat: Uvula is midline, oropharynx is clear and moist and mucous membranes are normal.  Eyes: Pupils are equal, round, and reactive to light. Conjunctivae and EOM are normal.  Neck: No tracheal deviation  present. Thyromegaly (Mild. Fullness on inspection) present.  Cardiovascular: Normal rate and regular rhythm.  No murmur heard. Pulses:      Dorsalis pedis pulses are 2+ on the right side and 2+ on the left side.  Respiratory: Effort normal and breath sounds normal. No respiratory distress.  GI: Soft. She exhibits no mass. There is no hepatomegaly. There is no abdominal tenderness.  Genitourinary:    Genitourinary Comments: Deferred to gyn.   Musculoskeletal:        General: No edema.     Comments: No major deformity or signs of synovitis appreciated.  Lymphadenopathy:    She has no cervical adenopathy.       Right: No supraclavicular adenopathy present.       Left: No supraclavicular adenopathy present.  Neurological: She is alert and oriented to person, place, and time. She has normal strength. No cranial nerve deficit. Coordination and gait normal.  Reflex Scores:      Bicep reflexes are 2+ on the right side and 2+ on the left side.      Patellar reflexes are 2+ on the right side and 2+ on the left side. Skin: Skin is warm. No rash noted. No erythema.  Psychiatric: She has a normal mood and affect.  Well groomed, good eye contact.   ASSESSMENT AND PLAN:  Ms. JIMYA BRASSELL was here today annual physical examination.  Orders Placed This Encounter  Procedures  . US THYROID  . Hemoglobin A1c  . Lipid panel  . TSH  . CBC with Differential/Platelet  . T3, free  . T4, free  . VITAMIN D 25 Hydroxy (Vit-D Deficiency, Fractures)  . LDL cholesterol, direct   Lab Results  Component Value Date   WBC 7.1 09/07/2019   HGB 13.3 09/07/2019   HCT 39.3 09/07/2019   MCV 88.1 09/07/2019   PLT 316.0 09/07/2019    Lab Results  Component Value Date   CHOL 224 (H) 09/07/2019   HDL 36.20 (L) 09/07/2019   LDLDIRECT 113.0 09/07/2019   TRIG (H) 09/07/2019    519.0 Triglyceride is over 400; calculations on Lipids are invalid.  CHOLHDL 6 09/07/2019   Lab Results  Component Value  Date   TSH 0.96 09/07/2019    Lab Results  Component Value Date   HGBA1C 5.4 09/07/2019    Routine general medical examination at a health care facility We discussed the importance of regular physical activity and healthy diet for prevention of chronic illness and/or complications. Preventive guidelines reviewed. Vaccination up to date.  Next CPE in a year.  Fatigue, unspecified type We discussed possible etiologies: Systemic illness, immunologic,endocrinology,sleep disorder, psychiatric/psychologic, infectious,medications side effects, and idiopathic. Examination today does not suggest a serious process. Healthy diet and regular physical activity may help.  Further recommendations will be given according to lab results.  -     TSH -     CBC with Differential/Platelet -     T3, free -     T4, free  Screening for lipoid disorders -     Lipid panel  Screening for endocrine, metabolic and immunity disorder -     Hemoglobin A1c  Thyroid fullness Further recommendations according to imaging result.  Bipolar disorder Some of the symptoms she reported today could be related to this problem. Following with psychiatrist.  Iron deficiency anemia Could be contributing factor for fatigue. Further recommendation will be given according to CBC results.  Obesity, Class I, BMI 30-34.9 Some of her medications could be contributing to difficulty with weight loss. We discussed benefits of wt loss. Consistency with healthy diet and physical activity recommended.  Vitamin D deficiency, unspecified Further recommendations in regard to vitamin D supplementation will be given according to 25 OH vitamin D result.   Return in 1 year (on 09/06/2020) for cpe.    Dellar Traber G. Martinique, MD  [facility]. [place] office.

## 2019-09-07 NOTE — Assessment & Plan Note (Signed)
Some of her medications could be contributing to difficulty with weight loss. We discussed benefits of wt loss. Consistency with healthy diet and physical activity recommended.

## 2019-09-07 NOTE — Assessment & Plan Note (Signed)
Further recommendations in regard to vitamin D supplementation will be given according to 25 OH vitamin D result. 

## 2019-09-08 ENCOUNTER — Ambulatory Visit
Admission: RE | Admit: 2019-09-08 | Discharge: 2019-09-08 | Disposition: A | Discharge status: Home / Self Care | Payer: 59 | Source: Ambulatory Visit | Attending: Family Medicine | Admitting: Family Medicine

## 2019-09-08 DIAGNOSIS — E049 Nontoxic goiter, unspecified: Secondary | ICD-10-CM

## 2019-09-08 LAB — HEMOGLOBIN A1C: Hgb A1c MFr Bld: 5.4 % (ref 4.6–6.5)

## 2019-09-10 ENCOUNTER — Encounter: Payer: Self-pay | Admitting: Family Medicine

## 2019-09-10 MED ORDER — VITAMIN D (ERGOCALCIFEROL) 1.25 MG (50000 UNIT) PO CAPS: ORAL_CAPSULE | ORAL | 0 refills | Status: AC

## 2019-09-29 ENCOUNTER — Ambulatory Visit: Payer: 59 | Attending: Internal Medicine

## 2019-09-29 DIAGNOSIS — Z23 Encounter for immunization: Secondary | ICD-10-CM

## 2019-09-29 NOTE — Progress Notes (Signed)
   Covid-19 Vaccination Clinic  Name:  TASHAYA ACEITUNO    MRN: [id number] DOB: 1980-08-30  09/29/2019  Ms. Tremblay was observed post Covid-19 immunization for 15 minutes without incident. She was provided with Vaccine Information Sheet and instruction to access the V-Safe system.   Ms. Tagawa was instructed to call 911 with any severe reactions post vaccine: Marland Kitchen Difficulty breathing  . Swelling of face and throat  . A fast heartbeat  . A bad rash all over body  . Dizziness and weakness   Immunizations Administered    Name Date Dose VIS Date Route   Pfizer COVID-19 Vaccine 09/29/2019  3:41 PM 0.3 mL 06/24/2019 Intramuscular   Manufacturer: [place]   Lot: [id number]   [place]: [id number]

## 2019-10-24 ENCOUNTER — Ambulatory Visit: Payer: 59 | Attending: Internal Medicine

## 2019-10-24 DIAGNOSIS — Z23 Encounter for immunization: Secondary | ICD-10-CM

## 2019-10-24 NOTE — Progress Notes (Signed)
   Covid-19 Vaccination Clinic  Name:  Katie [name]    MRN: [id number] DOB: Jul 02, 1981  10/24/2019  Katie [name] was observed post Covid-19 immunization for 15 minutes without incident. She was provided with Vaccine Information Sheet and instruction to access the V-Safe system.   Katie [name] was instructed to call 911 with any severe reactions post vaccine: Marland Kitchen Difficulty breathing  . Swelling of face and throat  . A fast heartbeat  . A bad rash all over body  . Dizziness and weakness   Immunizations Administered    Name Date Dose VIS Date Route   Pfizer COVID-19 Vaccine 10/24/2019  4:16 PM 0.3 mL 06/24/2019 Intramuscular   Manufacturer: [place]   Lot: [id number]   [place]: [id number]

## 2019-11-10 ENCOUNTER — Other Ambulatory Visit: Payer: Self-pay | Admitting: Family Medicine

## 2019-11-10 DIAGNOSIS — E559 Vitamin D deficiency, unspecified: Secondary | ICD-10-CM

## 2019-11-17 ENCOUNTER — Encounter: Payer: Self-pay | Admitting: Family Medicine

## 2020-09-24 ENCOUNTER — Other Ambulatory Visit: Payer: Self-pay

## 2020-09-24 ENCOUNTER — Encounter: Payer: Self-pay | Admitting: Family Medicine

## 2020-09-24 ENCOUNTER — Ambulatory Visit (INDEPENDENT_AMBULATORY_CARE_PROVIDER_SITE_OTHER): Payer: 59 | Admitting: Family Medicine

## 2020-09-24 VITALS — BP 124/70 | HR 80 | Resp 12 | Ht 64.0 in | Wt 201.4 lb

## 2020-09-24 DIAGNOSIS — R5382 Chronic fatigue, unspecified: Secondary | ICD-10-CM | POA: Diagnosis not present

## 2020-09-24 DIAGNOSIS — F3170 Bipolar disorder, currently in remission, most recent episode unspecified: Secondary | ICD-10-CM

## 2020-09-24 DIAGNOSIS — E559 Vitamin D deficiency, unspecified: Secondary | ICD-10-CM | POA: Diagnosis not present

## 2020-09-24 DIAGNOSIS — E049 Nontoxic goiter, unspecified: Secondary | ICD-10-CM | POA: Diagnosis not present

## 2020-09-24 DIAGNOSIS — L0292 Furuncle, unspecified: Secondary | ICD-10-CM | POA: Diagnosis not present

## 2020-09-24 LAB — HEPATIC FUNCTION PANEL
ALT: 18 U/L (ref 0–35)
AST: 15 U/L (ref 0–37)
Albumin: 4.2 g/dL (ref 3.5–5.2)
Alkaline Phosphatase: 73 U/L (ref 39–117)
Bilirubin, Direct: 0 mg/dL (ref 0.0–0.3)
Total Bilirubin: 0.3 mg/dL (ref 0.2–1.2)
Total Protein: 6.6 g/dL (ref 6.0–8.3)

## 2020-09-24 MED ORDER — DOXYCYCLINE HYCLATE 100 MG PO TABS: 100.0000 mg | ORAL_TABLET | Freq: Two times a day (BID) | ORAL | 0 refills | Status: AC

## 2020-09-24 NOTE — Progress Notes (Signed)
Chief Complaint  Patient presents with  . Fatigue   HPI: Katie [name] is a 40 y.o. female, who is here today with her mother complaining of worsening fatigue. Feeling tired "all the time." This is a chronic problem, worse for the past couple years. Negative for abnormal wt loss ,night sweats,sore throat,cough,SOB,or CP.  Lab Results  Component Value Date   WBC 7.1 09/07/2019   HGB 13.3 09/07/2019   HCT 39.3 09/07/2019   MCV 88.1 09/07/2019   PLT 316.0 09/07/2019   Lab Results  Component Value Date   CREATININE 1.02 05/28/2015   BUN 22 05/28/2015   NA 141 05/28/2015   K 4.7 05/28/2015   CL 105 05/28/2015   CO2 28 05/28/2015   "Feeling rough" all the time. She thinks it may be cause by Lithium. According to her mother, Lithium level has not been checked in a while. Bipolar disorder on Lithium 300 mg am and 600 mg pm. She has been on Lithium for many years. She is also on Trazodone 100 mg daily at bedtime,Zonisamide 25 mg 4 caps daily,Luvox 100 mg bid. Her mood is "ok", she "can function." She feels "overwhelmed all the time" but dealing well with stress.  Follows with psychiatrist q 3 months.  Sleeping well, 7-8 hours. She wakes up sometimes but able to go back to sleep. No witness sleep apnea. She does not feel rested when she gets up.  Vit D deficiency: She is not taking Vit D supplementation. Last 25 OH vit D was 10.4 on 09/07/19.  A couple weeks ago noted tender area in left buttock. She has had similar lesions intermittently on buttocks and axillary areas.  She has not identified exacerbating factors. Negative for fever,chills,or myalgias. She has not tried OTC treatments.  Hx of enlarged thyroid. Her mother thinks it may be growing. She denies dysphagia or dysphonia.  Lab Results  Component Value Date   TSH 0.96 09/07/2019   Thyroid US on 09/08/19:Similar-appearing heterogeneous and enlarged thyroid without discrete nodule or mass.  Review of  Systems  Constitutional: Negative for activity change and appetite change.  HENT: Negative for mouth sores and nosebleeds.   Cardiovascular: Negative for palpitations and leg swelling.  Gastrointestinal: Negative for abdominal pain, nausea and vomiting.       Negative for changes in bowel habits.  Genitourinary: Negative for decreased urine volume, dysuria and hematuria.  Allergic/Immunologic: Positive for environmental allergies.  Neurological: Negative for syncope, weakness and headaches.  Hematological: Negative for adenopathy. Does not bruise/bleed easily.  Psychiatric/Behavioral: Negative for confusion and hallucinations.  Rest see pertinent positives and negatives per HPI.  Current Outpatient Medications on File Prior to Visit  Medication Sig Dispense Refill  . fluvoxaMINE (LUVOX) 100 MG tablet TAKE ONE TABLET BY MOUTH TWICE DAILY    . lithium 300 MG tablet Take 1 tablet by mouth in the morning, at noon, and at bedtime.    Marland Kitchen lithium 600 MG capsule Take 600 mg by mouth at bedtime.    . Multiple Vitamin (MULTIVITAMIN) capsule Take 1 capsule by mouth daily.    Marland Kitchen omeprazole (PRILOSEC) 40 MG capsule Take 40 mg by mouth daily.    . traZODone (DESYREL) 100 MG tablet Take 100 mg by mouth at bedtime.    Marland Kitchen zonisamide (ZONEGRAN) 25 MG capsule Take 100 mg by mouth daily.     No current facility-administered medications on file prior to visit.   Past Medical History:  Diagnosis Date  . Allergy   .  Anxiety    currently on luvox, hydroxyzine  . Asthma   . Bipolar affective disorder [facility])    onogoing Dr Nicholaus Corolla treatment for the past 10 years  . Chronic fatigue disorder   . Depression   . GERD (gastroesophageal reflux disease)   . History of kidney stones    highschool  . Iron deficiency anemia 02/23/2015  . Migraines    teenage onset with periods   Allergies  Allergen Reactions  . Zonisamide Nausea And Vomiting    Social History   Socioeconomic History  . Marital status: Married     Spouse name: Not on file  . Number of children: Not on file  . Years of education: Not on file  . Highest education level: Not on file  Occupational History  . Occupation: Print production planner and nanny  Tobacco Use  . Smoking status: Never Smoker  . Smokeless tobacco: Never Used  Substance and Sexual Activity  . Alcohol use: No    Alcohol/week: 4.0 standard drinks    Types: 4 Cans of beer per week  . Drug use: No  . Sexual activity: Yes    Birth control/protection: I.U.D.  Other Topics Concern  . Not on file  Social History Narrative   Grew up in [place]   Married 5 yrs   Occupation - prev occupation.  Preschool teacher and nanny   Never smoked.  No etoh no drugs   Social Determinants of Radio broadcast assistant Strain: Not on file  Food Insecurity: Not on file  Transportation Needs: Not on file  Physical Activity: Not on file  Stress: Not on file  Social Connections: Not on file    Vitals:   09/24/20 0756  BP: 124/70  Pulse: 80  Resp: 12  SpO2: 99%   Body mass index is 34.57 kg/m.  Physical Exam Vitals and nursing note reviewed.  Constitutional:      General: She is not in acute distress.    Appearance: She is well-developed.  HENT:     Head: Normocephalic and atraumatic.  Eyes:     Conjunctiva/sclera: Conjunctivae normal.     Pupils: Pupils are equal, round, and reactive to light.  Neck:     Thyroid: Thyromegaly present. No thyroid tenderness.  Cardiovascular:     Rate and Rhythm: Normal rate and regular rhythm.     Heart sounds: No murmur heard.   Pulmonary:     Effort: Pulmonary effort is normal. No respiratory distress.     Breath sounds: Normal breath sounds.  Abdominal:     Palpations: Abdomen is soft. There is no hepatomegaly or mass.     Tenderness: There is no abdominal tenderness.  Lymphadenopathy:     Cervical: No cervical adenopathy.  Skin:    General: Skin is warm.     Findings: No erythema or rash.          Comments: Post  inflammatory pigmentation changes, rounded scattered on both buttocks.  Neurological:     Mental Status: She is alert and oriented to person, place, and time.     Cranial Nerves: No cranial nerve deficit.     Gait: Gait normal.   ASSESSMENT AND PLAN:  Ms.Vola was seen today for fatigue.  Diagnoses and all orders for this visit: Orders Placed This Encounter  Procedures  . US THYROID  . Hepatic function panel  . Lithium level  . Ambulatory referral to Sleep Studies   Lab Results  Component Value Date  ALT 18 09/24/2020   AST 15 09/24/2020   ALKPHOS 73 09/24/2020   BILITOT 0.3 09/24/2020   Lab Results  Component Value Date   LITHIUM 0.7 09/24/2020   NA 141 05/28/2015   BUN 22 05/28/2015   CREATININE 1.02 05/28/2015   TSH 0.96 09/07/2019   WBC 7.1 09/07/2019   Chronic fatigue We discussed possible etiologies: Systemic illness, immunologic,endocrinology,sleep disorder, psychiatric/psychologic, infectious,medications side effects, and idiopathic. Examination today does not suggest a serious process.Some of her medications and chronic medical problems most likely contributing factors. Healthy diet and regular physical activity may help.  Further recommendations will be given according to lab results.   Enlarged thyroid gland We discussed last thyroid US results. Thyroid US will be arranged.  Forunculosis Warm compresses. Doxycycline x 7 days, some side effects discussed. Instructed about warning signs.  -     doxycycline (VIBRA-TABS) 100 MG tablet; Take 1 tablet (100 mg total) by mouth 2 (two) times daily for 7 days.  Vitamin D deficiency, unspecified Resume Vit D supplementation,2000 U daily.  Bipolar affective disorder in remission ([place]) Problem seems to be adequately controlled with current regimen. We discussed some side effects of Lithium. Recommend discussing her concerns about Lithium with her psychiatrist, may not be a better option to control  symptoms. Further recommendations according to Lithium level.   Return if symptoms worsen or fail to improve.   Betty G. Martinique, MD  [facility]. [place] office.  A few things to remember from today's visit:   Vitamin D deficiency, unspecified  Bipolar affective disorder in remission ([place]) - Plan: Lithium level  Chronic fatigue - Plan: Hepatic function panel, Ambulatory referral to Sleep Studies  Enlarged thyroid gland - Plan: US THYROID  Forunculosis - Plan: doxycycline (VIBRA-TABS) 100 MG tablet  If you need refills please call your pharmacy. Do not use My Chart to request refills or for acute issues that need immediate attention.   Resume Vit D supplementation.  Discuss your concerns about Lithium with your psychiatrist. Fatigue is a common symptom associated with multiple factors: psychologic,medications, systemic illness, sleep disorders,infections, and unknown causes. Some work-up can be done to evaluate for common causes as thyroid disease,anemia,diabetes, or abnormalities in calcium,potassium,or sodium. Regular physical activity as tolerated and a healthy diet is usually might help and usually recommended for chronic fatigue.   Further recommendations will be given according to lab results.  Please be sure medication list is accurate. If a new problem present, please set up appointment sooner than planned today.

## 2020-09-24 NOTE — Patient Instructions (Signed)
A few things to remember from today's visit:   Vitamin D deficiency, unspecified  Bipolar affective disorder in remission ([place]) - Plan: Lithium level  Chronic fatigue - Plan: Hepatic function panel, Ambulatory referral to Sleep Studies  Enlarged thyroid gland - Plan: US THYROID  Forunculosis - Plan: doxycycline (VIBRA-TABS) 100 MG tablet  If you need refills please call your pharmacy. Do not use My Chart to request refills or for acute issues that need immediate attention.   Resume Vit D supplementation.  Discuss your concerns about Lithium with your psychiatrist. Fatigue is a common symptom associated with multiple factors: psychologic,medications, systemic illness, sleep disorders,infections, and unknown causes. Some work-up can be done to evaluate for common causes as thyroid disease,anemia,diabetes, or abnormalities in calcium,potassium,or sodium. Regular physical activity as tolerated and a healthy diet is usually might help and usually recommended for chronic fatigue.   Further recommendations will be given according to lab results.  Please be sure medication list is accurate. If a new problem present, please set up appointment sooner than planned today.

## 2020-09-25 LAB — LITHIUM LEVEL: Lithium Lvl: 0.7 mmol/L (ref 0.6–1.2)

## 2020-10-04 ENCOUNTER — Other Ambulatory Visit: Payer: 59

## 2020-10-12 ENCOUNTER — Ambulatory Visit
Admission: RE | Admit: 2020-10-12 | Discharge: 2020-10-12 | Disposition: A | Discharge status: Home / Self Care | Payer: 59 | Source: Ambulatory Visit | Attending: Family Medicine | Admitting: Family Medicine

## 2020-10-12 DIAGNOSIS — E049 Nontoxic goiter, unspecified: Secondary | ICD-10-CM

## 2021-07-15 ENCOUNTER — Telehealth: Payer: 59 | Admitting: Physician Assistant

## 2021-07-15 DIAGNOSIS — U071 COVID-19: Secondary | ICD-10-CM | POA: Diagnosis not present

## 2021-07-15 MED ORDER — MOLNUPIRAVIR EUA 200MG CAPSULE: 4.0000 | ORAL_CAPSULE | Freq: Two times a day (BID) | ORAL | 0 refills | Status: AC

## 2021-07-15 NOTE — Patient Instructions (Signed)
Josem Kaufmann, thank you for joining Mar Daring, PA-C for today's virtual visit.  While this provider is not your primary care provider (PCP), if your PCP is located in our provider database this encounter information will be shared with them immediately following your visit.  Consent: (Patient) Katie [name] provided verbal consent for this virtual visit at the beginning of the encounter.  Current Medications:  Current Outpatient Medications:    molnupiravir EUA (LAGEVRIO) 200 mg CAPS capsule, Take 4 capsules (800 mg total) by mouth 2 (two) times daily for 5 days., Disp: 40 capsule, Rfl: 0   fluvoxaMINE (LUVOX) 100 MG tablet, TAKE ONE TABLET BY MOUTH TWICE DAILY, Disp: , Rfl:    lithium 300 MG tablet, Take 1 tablet by mouth in the morning, at noon, and at bedtime., Disp: , Rfl:    lithium 600 MG capsule, Take 600 mg by mouth at bedtime., Disp: , Rfl:    Multiple Vitamin (MULTIVITAMIN) capsule, Take 1 capsule by mouth daily., Disp: , Rfl:    omeprazole (PRILOSEC) 40 MG capsule, Take 40 mg by mouth daily., Disp: , Rfl:    traZODone (DESYREL) 100 MG tablet, Take 100 mg by mouth at bedtime., Disp: , Rfl:    zonisamide (ZONEGRAN) 25 MG capsule, Take 100 mg by mouth daily., Disp: , Rfl:    Medications ordered in this encounter:  Meds ordered this encounter  Medications   molnupiravir EUA (LAGEVRIO) 200 mg CAPS capsule    Sig: Take 4 capsules (800 mg total) by mouth 2 (two) times daily for 5 days.    Dispense:  40 capsule    Refill:  0    Order Specific Question:   Supervising Provider    Answer:   Sabra Heck, [place]     *If you need refills on other medications prior to your next appointment, please contact your pharmacy*  Follow-Up: Call back or seek an in-person evaluation if the symptoms worsen or if the condition fails to improve as anticipated.  Other Instructions Molnupiravir Oral Capsules What is this medication? MOLNUPIRAVIR (mol nue pir a vir) treats COVID-19.  It is an antiviral medication. It may decrease the risk of developing severe symptoms of COVID-19. It may also decrease the chance of going to the hospital. This medication is not approved by the FDA. The FDA has authorized emergency use of this medication during the COVID-19 pandemic. This medicine may be used for other purposes; ask your health care provider or pharmacist if you have questions. COMMON BRAND NAME(S): LAGEVRIO What should I tell my care team before I take this medication? They need to know if you have any of these conditions: Any allergies Any serious illness An unusual or allergic reaction to molnupiravir, other medications, foods, dyes, or preservatives Pregnant or trying to get pregnant Breast-feeding How should I use this medication? Take this medication by mouth with water. Take it as directed on the prescription label at the same time every day. Do not cut, crush or chew this medication. Swallow the capsules whole. You can take it with or without food. If it upsets your stomach, take it with food. Take all of this medication unless your care team tells you to stop it early. Keep taking it even if you think you are better. Talk to your care team about the use of this medication in children. Special care may be needed. Overdosage: If you think you have taken too much of this medicine contact a poison control center or  emergency room at once. NOTE: This medicine is only for you. Do not share this medicine with others. What if I miss a dose? If you miss a dose, take it as soon as you can unless it is more than 10 hours late. If it is more than 10 hours late, skip the missed dose. Take the next dose at the normal time. Do not take extra or 2 doses at the same time to make up for the missed dose. What may interact with this medication? Interactions have not been studied. This list may not describe all possible interactions. Give your health care provider a list of all the  medicines, herbs, non-prescription drugs, or dietary supplements you use. Also tell them if you smoke, drink alcohol, or use illegal drugs. Some items may interact with your medicine. What should I watch for while using this medication? Your condition will be monitored carefully while you are receiving this medication. Visit your care team for regular checkups. Tell your care team if your symptoms do not start to get better or if they get worse. Do not become pregnant while taking this medication. You may need a pregnancy test before starting this medication. Women must use a reliable form of birth control while taking this medication and for 4 days after stopping the medication. Women should inform their care team if they wish to become pregnant or think they might be pregnant. Men should not father a child while taking this medication and for 3 months after stopping it. There is potential for serious harm to an unborn child. Talk to your care team for more information. Do not breast-feed an infant while taking this medication and for 4 days after stopping the medication. What side effects may I notice from receiving this medication? Side effects that you should report to your care team as soon as possible: Allergic reactions--skin rash, itching, hives, swelling of the face, lips, tongue, or throat Side effects that usually do not require medical attention (report these to your care team if they continue or are bothersome): Diarrhea Dizziness Nausea This list may not describe all possible side effects. Call your doctor for medical advice about side effects. You may report side effects to FDA at 1-800-FDA-1088. Where should I keep my medication? Keep out of the reach of children and pets. Store at room temperature between 20 and 25 degrees C (68 and 77 degrees F). Get rid of any unused medication after the expiration date. To get rid of medications that are no longer needed or have expired: Take the  medication to a medication take-back program. Check with your pharmacy or law enforcement to find a location. If you cannot return the medication, check the label or package insert to see if the medication should be thrown out in the garbage or flushed down the toilet. If you are not sure, ask your care team. If it is safe to put it in the trash, take the medication out of the container. Mix the medication with cat litter, dirt, coffee grounds, or other unwanted substance. Seal the mixture in a bag or container. Put it in the trash. NOTE: This sheet is a summary. It may not cover all possible information. If you have questions about this medicine, talk to your doctor, pharmacist, or health care provider.  2022 Elsevier/Gold Standard (2020-07-09 00:00:00)    If you have been instructed to have an in-person evaluation today at a local Urgent Care facility, please use the link below. It will take  you to a list of all of our available [facility], including address, phone number and hours of operation. Please do not delay care.  [facility]  If you or a family member do not have a primary care provider, use the link below to schedule a visit and establish care. When you choose a [facility] primary care physician or advanced practice provider, you gain a long-term partner in health. Find a Primary Care Provider  Learn more about 's in-office and virtual care options: [facility]

## 2021-07-15 NOTE — Progress Notes (Signed)
Virtual Visit Consent   Katie [name], you are scheduled for a virtual visit with a [facility] provider today.     Just as with appointments in the office, your consent must be obtained to participate.  Your consent will be active for this visit and any virtual visit you may have with one of our providers in the next 365 days.     If you have a MyChart account, a copy of this consent can be sent to you electronically.  All virtual visits are billed to your insurance company just like a traditional visit in the office.    As this is a virtual visit, video technology does not allow for your provider to perform a traditional examination.  This may limit your provider's ability to fully assess your condition.  If your provider identifies any concerns that need to be evaluated in person or the need to arrange testing (such as labs, EKG, etc.), we will make arrangements to do so.     Although advances in technology are sophisticated, we cannot ensure that it will always work on either your end or our end.  If the connection with a video visit is poor, the visit may have to be switched to a telephone visit.  With either a video or telephone visit, we are not always able to ensure that we have a secure connection.     I need to obtain your verbal consent now.   Are you willing to proceed with your visit today?    Katie [name] has provided verbal consent on 07/15/2021 for a virtual visit (video or telephone).   Mar Daring, PA-C   Date: 07/15/2021 3:39 PM   Virtual Visit via Video Note   IMar Daring, connected with  Katie [name]  ([id number], 02/15/1981) on 07/15/21 at  3:45 PM EST by a video-enabled telemedicine application and verified that I am speaking with the correct person using two identifiers.  Location: Patient: Virtual Visit Location Patient: Home Provider: Virtual Visit Location Provider: Home Office   I discussed the limitations of evaluation and management by  telemedicine and the availability of in person appointments. The patient expressed understanding and agreed to proceed.    History of Present Illness: Katie [name] is a 41 y.o. who identifies as a female who was assigned female at birth, and is being seen today for Covid 41.  HPI: URI  This is a new problem. The current episode started yesterday (Symptoms started yesterday; tested positive for Covid 19 today; Husband tested positive for COvid 19 a few days ago). The problem has been gradually worsening. There has been no fever. Associated symptoms include congestion, ear pain (right ear), headaches, rhinorrhea and a sore throat (reports right side of throat is more sore). Pertinent negatives include no coughing, diarrhea, nausea, plugged ear sensation, sinus pain or vomiting. Associated symptoms comments: Post nasal drainage. Treatments tried: ibuprofen. The treatment provided mild relief.     Problems:  Patient Active Problem List   Diagnosis Date Noted   Vitamin D deficiency, unspecified 09/07/2019   Obesity, Class I, BMI 30-34.9 09/07/2019   Iron deficiency anemia 02/23/2015   Obsessive compulsive disorder 02/22/2015   Postpartum care following cesarean delivery (8/11) 02/22/2015   Chronic low back pain 07/18/2014   Nonallopathic lesion of lumbosacral region 07/18/2014   Nonallopathic lesion of sacral region 07/18/2014   Nonallopathic lesion of thoracic region 07/18/2014   Chronic cholecystitis with calculus 03/06/2014   Abdominal  pain, epigastric 02/06/2014   Facial pain 12/21/2012   Neck fullness 08/16/2012   Chronic fatigue fibromyalgia syndrome 01/23/2012   Wrist pain, chronic, left 12/17/2011   Neck pain on right side 12/17/2011   Fibromyalgia 11/26/2011   Cramps, extremity 08/13/2011   Paraesthesia of skin 08/13/2011   Bipolar disorder ([place]) 10/14/2010   GERD (gastroesophageal reflux disease) 10/14/2010   Arthralgia 10/10/2010    Allergies:  Allergies  Allergen  Reactions   Zonisamide Nausea And Vomiting   Medications:  Current Outpatient Medications:    molnupiravir EUA (LAGEVRIO) 200 mg CAPS capsule, Take 4 capsules (800 mg total) by mouth 2 (two) times daily for 5 days., Disp: 40 capsule, Rfl: 0   fluvoxaMINE (LUVOX) 100 MG tablet, TAKE ONE TABLET BY MOUTH TWICE DAILY, Disp: , Rfl:    lithium 300 MG tablet, Take 1 tablet by mouth in the morning, at noon, and at bedtime., Disp: , Rfl:    lithium 600 MG capsule, Take 600 mg by mouth at bedtime., Disp: , Rfl:    Multiple Vitamin (MULTIVITAMIN) capsule, Take 1 capsule by mouth daily., Disp: , Rfl:    omeprazole (PRILOSEC) 40 MG capsule, Take 40 mg by mouth daily., Disp: , Rfl:    traZODone (DESYREL) 100 MG tablet, Take 100 mg by mouth at bedtime., Disp: , Rfl:    zonisamide (ZONEGRAN) 25 MG capsule, Take 100 mg by mouth daily., Disp: , Rfl:   Observations/Objective: Patient is well-developed, well-nourished in no acute distress.  Resting comfortably at home.  Head is normocephalic, atraumatic.  No labored breathing.  Speech is clear and coherent with logical content.  Patient is alert and oriented at baseline.    Assessment and Plan: 1. COVID-19 - molnupiravir EUA (LAGEVRIO) 200 mg CAPS capsule; Take 4 capsules (800 mg total) by mouth 2 (two) times daily for 5 days.  Dispense: 40 capsule; Refill: 0 - MyChart COVID-19 home monitoring program; Future  - Continue OTC symptomatic management of choice - Will send OTC vitamins and supplement information through AVS - Molnupiravir prescribed - Patient enrolled in MyChart symptom monitoring - Push fluids - Rest as needed - Discussed return precautions and when to seek in-person evaluation, sent via AVS as well  Follow Up Instructions: I discussed the assessment and treatment plan with the patient. The patient was provided an opportunity to ask questions and all were answered. The patient agreed with the plan and demonstrated an understanding of  the instructions.  A copy of instructions were sent to the patient via MyChart unless otherwise noted below.    The patient was advised to call back or seek an in-person evaluation if the symptoms worsen or if the condition fails to improve as anticipated.  Time:  I spent 10 minutes with the patient via telehealth technology discussing the above problems/concerns.    Mar Daring, PA-C

## 2021-08-14 ENCOUNTER — Other Ambulatory Visit: Payer: Self-pay | Admitting: *Deleted

## 2021-08-14 ENCOUNTER — Encounter: Payer: Self-pay | Admitting: *Deleted

## 2021-08-15 ENCOUNTER — Encounter: Payer: Self-pay | Admitting: Psychiatry

## 2021-08-15 ENCOUNTER — Ambulatory Visit (INDEPENDENT_AMBULATORY_CARE_PROVIDER_SITE_OTHER): Payer: 59 | Admitting: Psychiatry

## 2021-08-15 VITALS — BP 115/77 | HR 77 | Ht 64.0 in | Wt 195.8 lb

## 2021-08-15 DIAGNOSIS — M5416 Radiculopathy, lumbar region: Secondary | ICD-10-CM | POA: Diagnosis not present

## 2021-08-15 DIAGNOSIS — H5462 Unqualified visual loss, left eye, normal vision right eye: Secondary | ICD-10-CM | POA: Diagnosis not present

## 2021-08-15 DIAGNOSIS — M5412 Radiculopathy, cervical region: Secondary | ICD-10-CM | POA: Diagnosis not present
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
## 2021-08-15 NOTE — Patient Instructions (Signed)
MRI brain, cervical spine, lumbar spine

## 2021-08-19 ENCOUNTER — Telehealth: Payer: Self-pay | Admitting: Psychiatry

## 2021-08-19 NOTE — Telephone Encounter (Signed)
MRI Brain, Orbits and Cervical spine is scheduled at [facility] for 08/21/21  MR Lumbar spine is scheduled for 08/28/21 at [facility].  [employer] auth: [employer] via [facility].

## 2021-08-21 ENCOUNTER — Ambulatory Visit: Payer: 59

## 2021-08-21 ENCOUNTER — Other Ambulatory Visit: Payer: Self-pay

## 2021-08-21 DIAGNOSIS — M5412 Radiculopathy, cervical region: Secondary | ICD-10-CM | POA: Diagnosis not present

## 2021-08-21 DIAGNOSIS — H5462 Unqualified visual loss, left eye, normal vision right eye: Secondary | ICD-10-CM | POA: Diagnosis not present

## 2021-08-21 MED ORDER — GADOBENATE DIMEGLUMINE 529 MG/ML IV SOLN
15.0000 mL | Freq: Once | INTRAVENOUS | Status: AC | PRN
  Administered 2021-08-21: 15 mL via INTRAVENOUS

## 2021-08-26 ENCOUNTER — Other Ambulatory Visit: Payer: Self-pay | Admitting: Psychiatry

## 2021-08-26 DIAGNOSIS — H547 Unspecified visual loss: Secondary | ICD-10-CM

## 2021-08-28 ENCOUNTER — Ambulatory Visit (INDEPENDENT_AMBULATORY_CARE_PROVIDER_SITE_OTHER): Payer: 59

## 2021-08-28 ENCOUNTER — Other Ambulatory Visit (INDEPENDENT_AMBULATORY_CARE_PROVIDER_SITE_OTHER): Payer: Self-pay

## 2021-08-28 DIAGNOSIS — Z0289 Encounter for other administrative examinations: Secondary | ICD-10-CM

## 2021-08-28 DIAGNOSIS — M5416 Radiculopathy, lumbar region: Secondary | ICD-10-CM | POA: Diagnosis not present

## 2021-08-28 DIAGNOSIS — H547 Unspecified visual loss: Secondary | ICD-10-CM

## 2021-08-29 ENCOUNTER — Other Ambulatory Visit: Payer: Self-pay | Admitting: Psychiatry

## 2021-08-29 DIAGNOSIS — E7849 Other hyperlipidemia: Secondary | ICD-10-CM

## 2021-08-29 LAB — HEMOGLOBIN A1C
Est. average glucose Bld gHb Est-mCnc: 108 mg/dL
Hgb A1c MFr Bld: 5.4 % (ref 4.8–5.6)

## 2021-08-29 LAB — LIPID PANEL
Chol/HDL Ratio: 7 ratio — ABNORMAL HIGH (ref 0.0–4.4)
Cholesterol, Total: 239 mg/dL — ABNORMAL HIGH (ref 100–199)
HDL: 34 mg/dL — ABNORMAL LOW (ref >39)
Triglycerides: 831 mg/dL (ref 0–149)

## 2021-08-29 LAB — SEDIMENTATION RATE: Sed Rate: 19 mm/hr (ref 0–32)

## 2021-08-29 LAB — C-REACTIVE PROTEIN: CRP: 6 mg/L (ref 0–10)

## 2021-09-09 ENCOUNTER — Other Ambulatory Visit (INDEPENDENT_AMBULATORY_CARE_PROVIDER_SITE_OTHER): Payer: Self-pay

## 2021-09-09 DIAGNOSIS — E7849 Other hyperlipidemia: Secondary | ICD-10-CM

## 2021-09-09 DIAGNOSIS — Z0289 Encounter for other administrative examinations: Secondary | ICD-10-CM

## 2021-09-10 LAB — LDL CHOLESTEROL, DIRECT: LDL Direct: 119 mg/dL — ABNORMAL HIGH (ref 0–99)

## 2021-09-16 ENCOUNTER — Encounter: Payer: Self-pay | Admitting: Psychiatry

## 2021-10-09 ENCOUNTER — Encounter: Payer: Self-pay | Admitting: Psychiatry

## 2021-10-09 ENCOUNTER — Telehealth: Payer: Self-pay | Admitting: *Deleted

## 2021-10-09 ENCOUNTER — Ambulatory Visit (INDEPENDENT_AMBULATORY_CARE_PROVIDER_SITE_OTHER): Payer: 59 | Admitting: Psychiatry

## 2021-10-09 VITALS — BP 107/63 | HR 71 | Ht 64.0 in | Wt 192.0 lb

## 2021-10-09 DIAGNOSIS — G43119 Migraine with aura, intractable, without status migrainosus: Secondary | ICD-10-CM

## 2021-10-09 MED ORDER — UBRELVY 100 MG PO TABS: 100.0000 mg | ORAL_TABLET | ORAL | 3 refills | Status: DC | PRN

## 2021-10-09 MED ORDER — CYCLOBENZAPRINE HCL 5 MG PO TABS: 5.0000 mg | ORAL_TABLET | Freq: Three times a day (TID) | ORAL | 3 refills | Status: DC | PRN

## 2021-10-09 MED ORDER — EMGALITY 120 MG/ML ~~LOC~~ SOAJ: 2.0000 "pen " | Freq: Once | SUBCUTANEOUS | 0 refills | Status: AC

## 2021-10-09 MED ORDER — EMGALITY 120 MG/ML ~~LOC~~ SOAJ: 1.0000 "pen " | SUBCUTANEOUS | 3 refills | Status: DC

## 2021-10-09 NOTE — Telephone Encounter (Signed)
[employer] PA, key [id number], faxed office notes to be attached. ?10/10/21 received e mail re: documents attached. Sent PA to plan. ?Your information has been sent to [employer]. ?

## 2021-10-09 NOTE — Patient Instructions (Addendum)
Start Emgality for migraine prevention. First doses 2 injections, then inject one pen monthly ?Take Ubrelvy 100 mg as needed for migraines. Can repeat a dose in 2 hours if headache persists. Max dose is 2 pills in 24 hours ?Can see if you qualify for savings cards: ? ?SaveWhois.co.nz ?ItCheaper.no ?4. Can increase magnesium to 800 mg daily for migraine aura ?

## 2021-10-09 NOTE — Progress Notes (Signed)
? ?  CC:  vision changes, headaches ? ?Follow-up Visit ? ?Last visit: 08/15/21 ? ?Brief HPI: ?41 year old female with a history of migraines, bipolar disorder, fibromyalgia, and GERD who follows in clinic for vision changes and migraines.  ? ?At her last visit she endorsed vision loss, incontinence and upper/lower extremity radicular pain, so MRI brain, C-spine, and L-spine were ordered. ? ?Interval History: ?Since her last visit her vision has started to improve. She continues to have intermittent blurriness in her vision, flickering lights, and dimming around the periphery of her left eye but it is no longer constant. She has been taking magnesium 500 mg daily which seems to help. Migraines have slightly increased in frequency since she ran out of Flexeril. Currently has 2-3 bad migraines per month. She does have lower level headaches on most days. Continues to take Zonisamide for prevention which helps but causes intermittent nausea. ? ?MRI brain/orbits and MRI C-spine 08/21/21 were unremarkable. MRI L-spine showed mild bilateral foraminal stenosis at L4-5. A1c, ESR, and CRP were wnl. LDL was 119. ? ?Previous Therapy: ?Imitrex ?Zomig nasal spray ?Excedrin ?Amitriptyline - mood changes ?Topamax - nausea ?Zonisamide 100 mg daily - nausea ?Lamictal - dysuria ?Cannot take NSAIDs due to chronic lithium therapy ? ?Physical Exam:  ? ?Vital Signs: ?BP 107/63   Pulse 71   Ht $R'5\' 4"'yA$  (1.626 m)   Wt 192 lb (87.1 kg)   BMI 32.96 kg/m?  ?GENERAL:  well appearing, in no acute distress, alert  ?SKIN:  Color, texture, turgor normal. No rashes or lesions ?HEAD:  Normocephalic/atraumatic. ?RESP: normal respiratory effort ?MSK:  No gross joint deformities.  ? ?NEUROLOGICAL: ?Mental Status: Alert, oriented to person, place and time, Follows commands, and Speech fluent and appropriate. ?Cranial Nerves: PERRL, face symmetric, no dysarthria, hearing grossly intact ?Motor: moves all extremities equally ?Gait:  normal-based. ? ?IMPRESSION: ?41 year old female with a history of migraines, bipolar disorder, fibromyalgia, and GERD who presents for follow up of vision changes and migraines. MRI brain/orbits was unremarkable. Her vision has started to improve since starting daily magnesium, though she continues to have intermittent vision changes. Will have her increase magnesium to 800 mg daily. She continues to have migraines with Zonisamide, and the current dose causes intermittent nausea. Will start Emgality for migraine prevention. Will start Ubrelvy for rescue.  ? ?PLAN: ?-Prevention: Start Emgality 120 mg monthly (sample for loading dose given in office today. Continue Zonisamide 100 mg daily for now, consider weaning off if headaches stabilize on Emgality ?-Rescue: Start Ubrelvy 100 mg PRN. Continue Flexeril 5 mg PRN. ?-Increase magnesium to 800 mg daily for visual aura ?-next steps: consider Botox, gabapentin for aura, consider referral to neuro-ophthalmology if vision changes persist/worsen despite migraine treatment ? ? ?Follow-up: 4 months ? ?I spent a total of 39 minutes on the date of the service. Discussed medication side effects, adverse reactions and drug interactions. Written educational materials and patient instructions outlining all of the above were given. ? ?Genia Harold, MD ?10/09/21 ?1:31 PM ? ?

## 2021-10-09 NOTE — Telephone Encounter (Signed)
Roselyn Meier PA, key [id number] , [id number]. If [employer] has not replied to your request within 72 hours please contact [employer] at [phone]. ?

## 2021-10-10 ENCOUNTER — Encounter: Payer: Self-pay | Admitting: *Deleted

## 2021-10-10 NOTE — Telephone Encounter (Addendum)
[employer]mg[employer] PA Case: [id number], Status: Approved, Coverage Starts on: 10/10/2021 12:00:00 AM, Coverage Ends on: 10/10/2022 12:00:00 AM. Sent her my chart to advise. ?

## 2021-10-10 NOTE — Telephone Encounter (Signed)
[facility] PA Case: [id number], Status: Approved, Coverage Starts on: 10/10/2021 12:00:00 AM, Coverage Ends on: 10/10/2022 12:00:00 AM ?

## 2021-11-11 ENCOUNTER — Emergency Department (INDEPENDENT_AMBULATORY_CARE_PROVIDER_SITE_OTHER): Payer: 59

## 2021-11-11 ENCOUNTER — Emergency Department (INDEPENDENT_AMBULATORY_CARE_PROVIDER_SITE_OTHER)
Admission: RE | Admit: 2021-11-11 | Discharge: 2021-11-11 | Disposition: A | Discharge status: Home / Self Care | Payer: 59 | Source: Ambulatory Visit

## 2021-11-11 VITALS — BP 115/77 | HR 92 | Temp 98.6°F | Resp 19 | Ht 64.0 in | Wt 195.0 lb

## 2021-11-11 DIAGNOSIS — S39012A Strain of muscle, fascia and tendon of lower back, initial encounter: Secondary | ICD-10-CM | POA: Diagnosis not present

## 2021-11-11 DIAGNOSIS — M5440 Lumbago with sciatica, unspecified side: Secondary | ICD-10-CM | POA: Diagnosis not present

## 2021-11-11 DIAGNOSIS — J029 Acute pharyngitis, unspecified: Secondary | ICD-10-CM | POA: Diagnosis not present

## 2021-11-11 LAB — POCT RAPID STREP A (OFFICE): Rapid Strep A Screen: NEGATIVE

## 2021-11-11 MED ORDER — BACLOFEN 10 MG PO TABS: 10.0000 mg | ORAL_TABLET | Freq: Three times a day (TID) | ORAL | 0 refills | Status: DC

## 2021-11-11 MED ORDER — METHYLPREDNISOLONE ACETATE 80 MG/ML IJ SUSP
80.0000 mg | Freq: Once | INTRAMUSCULAR | Status: AC
  Administered 2021-11-11: 80 mg via INTRAMUSCULAR

## 2021-11-11 MED ORDER — METHYLPREDNISOLONE 4 MG PO TBPK: ORAL_TABLET | ORAL | 0 refills | Status: DC

## 2021-11-11 NOTE — ED Provider Notes (Signed)
?[facility] ? ? ? ?CSN: [id number] ?Arrival date & time: 11/11/21  4193 ? ? ?  ? ?History   ?Chief Complaint ?Chief Complaint  ?Patient presents with  ? Back Pain  ?  I tweaked my back badly on Friday. I have pain in the lower left/hip area that radiates down my left leg. It is very painful, especially when sitting down and getting up. I also have a cough and some hoarseness, but the back pain is my main concern. - Entered by patient  ? Sore Throat  ?  Sore throat and cough. X2 days  ? ? ?HPI ?Katie [name] is a 41 y.o. female.  ? ?HPI 41 year old female presents with lower back pain secondary to injury 3 days ago.  Also reports cough and sore throat for 1 day.  PMH significant for bipolar affective disorder, fibromyalgia and GERD ? ?Past Medical History:  ?Diagnosis Date  ? Allergy   ? Anxiety   ? currently on luvox, hydroxyzine  ? Asthma   ? Bipolar affective disorder ([place])   ? onogoing Dr [facility] treatment for the past 10 years  ? Chronic fatigue disorder   ? Chronic migraine without aura   ? Depression   ? Fibromyalgia   ? GERD (gastroesophageal reflux disease)   ? History of kidney stones   ? highschool  ? History of migraine headaches   ? Insomnia   ? Interstitial cystitis   ? Iron deficiency anemia 02/23/2015  ? Migraines   ? teenage onset with periods  ? Panic attacks   ? Suicide attempt [facility])   ? Vision changes   ? ? ?Patient Active Problem List  ? Diagnosis Date Noted  ? Vitamin D deficiency, unspecified 09/07/2019  ? Obesity, Class I, BMI 30-34.9 09/07/2019  ? Iron deficiency anemia 02/23/2015  ? Obsessive compulsive disorder 02/22/2015  ? Postpartum care following cesarean delivery (8/11) 02/22/2015  ? Chronic low back pain 07/18/2014  ? Nonallopathic lesion of lumbosacral region 07/18/2014  ? Nonallopathic lesion of sacral region 07/18/2014  ? Nonallopathic lesion of thoracic region 07/18/2014  ? Chronic cholecystitis with calculus 03/06/2014  ? Abdominal pain, epigastric 02/06/2014  ? Facial  pain 12/21/2012  ? Neck fullness 08/16/2012  ? Chronic fatigue fibromyalgia syndrome 01/23/2012  ? Wrist pain, chronic, left 12/17/2011  ? Neck pain on right side 12/17/2011  ? Fibromyalgia 11/26/2011  ? Cramps, extremity 08/13/2011  ? Paraesthesia of skin 08/13/2011  ? Bipolar disorder ([place]) 10/14/2010  ? GERD (gastroesophageal reflux disease) 10/14/2010  ? Arthralgia 10/10/2010  ? ? ?Past Surgical History:  ?Procedure Laterality Date  ? ADENOIDECTOMY    ? 1990  ? CESAREAN SECTION N/A 02/22/2015  ? Procedure: CESAREAN SECTION;  Surgeon: Brien Few, MD;  Location: [facility];  Service: Obstetrics;  Laterality: N/A;  ? CHOLECYSTECTOMY    ? HEMANGIOMA EXCISION    ? 2009 removal from right ar  ? KNEE SURGERY    ? left knee torn cartilage removed 1994  ? MASS EXCISION Right 11/05/2018  ? Procedure: EXCISION MASS RIGHT FOREARM;  Surgeon: Charlotte Crumb, MD;  Location: [facility];  Service: Orthopedics;  Laterality: Right;  ? ? ?OB History   ? ? Gravida  ?1  ? Para  ?1  ? Term  ?1  ? Preterm  ?   ? AB  ?   ? Living  ?1  ?  ? ? SAB  ?   ? IAB  ?   ? Ectopic  ?   ?  Multiple  ?0  ? Live Births  ?1  ?   ?  ?  ? ? ? ?Home Medications   ? ?Prior to Admission medications   ?Medication Sig Start Date End Date Taking? Authorizing Provider  ?baclofen (LIORESAL) 10 MG tablet Take 1 tablet (10 mg total) by mouth 3 (three) times daily. 11/11/21  Yes Eliezer Lofts, FNP  ?diclofenac (VOLTAREN) 50 MG EC tablet Take 50 mg by mouth 2 (two) times daily. 07/11/21  Yes [provider]  ?fluvoxaMINE (LUVOX) 100 MG tablet TAKE ONE TABLET BY MOUTH TWICE DAILY 12/13/14  Yes [provider]  ?Galcanezumab-gnlm (EMGALITY) 120 MG/ML SOAJ Inject 1 pen. into the skin every 30 (thirty) days. 10/09/21  Yes Genia Harold, MD  ?lithium 300 MG tablet Take 1 tablet by mouth in the morning, at noon, and at bedtime. 09/05/20  Yes [provider]  ?lithium 600 MG capsule Take 600 mg by mouth at bedtime.   Yes [provider]  ?methylPREDNISolone (MEDROL DOSEPAK) 4 MG TBPK tablet Take as directed. 11/11/21  Yes Eliezer Lofts, FNP  ?omeprazole (PRILOSEC) 40 MG capsule Take 40 mg by mouth daily.   Yes Joline Salt, RN  ?traZODone (DESYREL) 50 MG tablet Take 50 mg by mouth at bedtime.   Yes Joline Salt, RN  ?Ubrogepant (UBRELVY) 100 MG TABS Take 100 mg by mouth as needed. Can repeat a dose in 2 hours if headache persists. Max dose 2 pills in 24 hours 10/09/21  Yes Chima, Anderson Malta, MD  ?zonisamide (ZONEGRAN) 25 MG capsule Take 100 mg by mouth daily. 09/03/20  Yes [provider]  ? ? ?Family History ?Family History  ?Problem Relation Age of Onset  ? Osteoarthritis Mother   ? Lymphoma Mother   ?     Non-Hodgkin's lymphoma  ? Hyperlipidemia Mother   ? Hypertension Mother   ? Diabetes Mother   ?     type 2  ? Cancer Mother   ?     breast  ? Osteoarthritis Father   ? Other Father   ?     Depression.anxiety  ? Lung cancer Maternal Grandmother   ? Diabetes Maternal Grandmother   ? Alcohol abuse Maternal Grandfather   ? Prostate cancer Maternal Grandfather   ? Alcohol abuse Paternal Grandmother   ? Alcohol abuse Paternal Grandfather   ? Lung cancer Paternal Grandfather   ? Diabetes Maternal Aunt   ? ? ?Social History ?Social History  ? ?Tobacco Use  ? Smoking status: Never  ? Smokeless tobacco: Never  ?Substance Use Topics  ? Alcohol use: Yes  ?  Alcohol/week: 4.0 standard drinks  ?  Types: 4 Cans of beer per week  ?  Comment: occas  ? Drug use: Yes  ?  Types: Marijuana  ?  Comment: 08/15/21 rarely  ? ? ? ?Allergies   ?Patient has no active allergies. ? ? ?Review of Systems ?Review of Systems  ?HENT:  Positive for sore throat.   ?Musculoskeletal:  Positive for back pain.  ?All other systems reviewed and are negative. ? ? ?Physical Exam ?Triage Vital Signs ?ED Triage Vitals  ?Enc Vitals Group  ?   BP 11/11/21 1054 115/77  ?   Pulse Rate 11/11/21 1054 92  ?   Resp 11/11/21 1054 19  ?   Temp 11/11/21 1054 98.6 ?F (37 ?C)  ?    Temp Source 11/11/21 1054 Oral  ?   SpO2 11/11/21 1054 96 %  ?  Weight 11/11/21 1052 195 lb (88.5 kg)  ?   Height 11/11/21 1052 '5\' 4"'$  (1.626 m)  ?   Head Circumference --   ?   Peak Flow --   ?   Pain Score 11/11/21 1051 3  ?   Pain Loc --   ?   Pain Edu? --   ?   Excl. in [place]? --   ? ?No data found. ? ?Updated Vital Signs ?BP 115/77 (BP Location: Right Arm)   Pulse 92   Temp 98.6 ?F (37 ?C) (Oral)   Resp 19   Ht '5\' 4"'$  (1.626 m)   Wt 195 lb (88.5 kg)   SpO2 96%   BMI 33.47 kg/m?  ? ?   ? ?Physical Exam ?Vitals and nursing note reviewed.  ?Constitutional:   ?   Appearance: Normal appearance. She is obese.  ?HENT:  ?   Head: Normocephalic and atraumatic.  ?   Right Ear: Tympanic membrane, ear canal and external ear normal.  ?   Left Ear: Tympanic membrane, ear canal and external ear normal.  ?   Mouth/Throat:  ?   Mouth: Mucous membranes are moist.  ?   Pharynx: Oropharynx is clear. Uvula midline.  ?Eyes:  ?   Conjunctiva/sclera: Conjunctivae normal.  ?   Pupils: Pupils are equal, round, and reactive to light.  ?Cardiovascular:  ?   Rate and Rhythm: Normal rate and regular rhythm.  ?   Heart sounds: Normal heart sounds.  ?Pulmonary:  ?   Effort: Pulmonary effort is normal.  ?   Breath sounds: Normal breath sounds. No wheezing, rhonchi or rales.  ?Abdominal:  ?   Tenderness: There is no right CVA tenderness or left CVA tenderness.  ?Musculoskeletal:     ?   General: Normal range of motion.  ?   Cervical back: Normal range of motion and neck supple.  ?   Comments: Lumbar sacral spine: TTP over left-sided paraspinous muscles, left sided inferior spinal erectors, patient reports radiating pain to left buttocks, left hip, left upper/lower leg  ?Skin: ?   General: Skin is warm and dry.  ?Neurological:  ?   General: No focal deficit present.  ?   Mental Status: She is alert and oriented to person, place, and time. Mental status is at baseline.  ? ? ? ?UC Treatments / Results  ?Labs ?(all labs ordered are listed, but  only abnormal results are displayed) ?Labs Reviewed  ?CULTURE, GROUP A STREP  ?POCT RAPID STREP A (OFFICE)  ? ? ?EKG ? ? ?Radiology ?DG Lumbar Spine Complete ? ?Result Date: 11/11/2021 ?CLINICAL DATA:  Acute le

## 2021-11-11 NOTE — Discharge Instructions (Addendum)
Advised/informed patient of today's lumbar spine x-ray results.  Instructed patient to take medication as directed with food to completion.  Advised patient to start Medrol Dosepak tomorrow, Tuesday, May/2/23.  Encouraged patient to increase daily water intake while taking this medication.  Instructed patient to discontinue Flexeril and start Baclofen.  Advised may take Baclofen daily or as needed for accompanying muscle spasms of lower back.  Advised patient if symptoms worsen and/or unresolved please follow-up PCP or here for further evaluation. ?

## 2021-11-11 NOTE — ED Triage Notes (Signed)
Pt states that she has injured her back and has some back pain. X3 days ? ?Pt states that she also has cough and sore throat. X1 day ? ? ?Pt states that she is vaccinated for covid.  ?Pt states that she has had flu vaccine.  ?

## 2021-11-13 ENCOUNTER — Encounter: Payer: Self-pay | Admitting: Family Medicine

## 2021-11-14 ENCOUNTER — Encounter: Payer: Self-pay | Admitting: Psychiatry

## 2021-11-15 LAB — CULTURE, GROUP A STREP: Strep A Culture: NEGATIVE

## 2021-11-20 ENCOUNTER — Ambulatory Visit (INDEPENDENT_AMBULATORY_CARE_PROVIDER_SITE_OTHER): Payer: 59 | Admitting: Family Medicine

## 2021-11-20 VITALS — BP 110/70 | HR 75 | Temp 97.3°F | Ht 64.0 in | Wt 194.5 lb

## 2021-11-20 DIAGNOSIS — R059 Cough, unspecified: Secondary | ICD-10-CM

## 2021-11-20 DIAGNOSIS — J019 Acute sinusitis, unspecified: Secondary | ICD-10-CM

## 2021-11-20 DIAGNOSIS — H6692 Otitis media, unspecified, left ear: Secondary | ICD-10-CM | POA: Diagnosis not present

## 2021-11-20 DIAGNOSIS — J029 Acute pharyngitis, unspecified: Secondary | ICD-10-CM

## 2021-11-20 LAB — POC COVID19 BINAXNOW: SARS Coronavirus 2 Ag: NEGATIVE

## 2021-11-20 LAB — POCT RAPID STREP A (OFFICE): Rapid Strep A Screen: NEGATIVE

## 2021-11-20 MED ORDER — AMOXICILLIN-POT CLAVULANATE 875-125 MG PO TABS: 1.0000 | ORAL_TABLET | Freq: Two times a day (BID) | ORAL | 0 refills | Status: DC

## 2021-11-20 NOTE — Progress Notes (Signed)
? ?Established Patient Office Visit ? ?Subjective   ?Patient ID: Katie [name], female    DOB: March 15, 1981  Age: 41 y.o. MRN: [id number] ? ?Chief Complaint  ?Patient presents with  ? Nasal Congestion  ? Cough  ?  Patient complains of cough,x2 weeks,  Productive with yellow sputum, Patient tried Sudafed with little relief  ? Sore Throat  ?  Patient complains of sore throat, x2 weeks  ? ? ?HPI ? ? ?Patient seen with over 2-week history of upper respiratory symptoms.  She has had sore throat, laryngitis, cough which is nonproductive, postnasal drip and some left ear pain.  Her son apparently had strep throat recently.  Patient went to urgent care on 1 May and had negative rapid strep.  She is tried some Sudafed without much improvement.  No history of smoking.  Does have some intermittent headaches and facial pain specially frontal and maxillary sinuses.  Productive cough with some dark sputum.  No hemoptysis. ? ?COVID test and repeat rapid strep here in office negative today ? ?Past Medical History:  ?Diagnosis Date  ? Allergy   ? Anxiety   ? currently on luvox, hydroxyzine  ? Asthma   ? Bipolar affective disorder ([place])   ? onogoing Dr [facility] treatment for the past 10 years  ? Chronic fatigue disorder   ? Chronic migraine without aura   ? Depression   ? Fibromyalgia   ? GERD (gastroesophageal reflux disease)   ? History of kidney stones   ? highschool  ? History of migraine headaches   ? Insomnia   ? Interstitial cystitis   ? Iron deficiency anemia 02/23/2015  ? Migraines   ? teenage onset with periods  ? Panic attacks   ? Suicide attempt [facility])   ? Vision changes   ? ?Past Surgical History:  ?Procedure Laterality Date  ? ADENOIDECTOMY    ? 1990  ? CESAREAN SECTION N/A 02/22/2015  ? Procedure: CESAREAN SECTION;  Surgeon: Brien Few, MD;  Location: [facility];  Service: Obstetrics;  Laterality: N/A;  ? CHOLECYSTECTOMY    ? HEMANGIOMA EXCISION    ? 2009 removal from right ar  ? KNEE SURGERY    ? left knee torn cartilage  removed 1994  ? MASS EXCISION Right 11/05/2018  ? Procedure: EXCISION MASS RIGHT FOREARM;  Surgeon: Charlotte Crumb, MD;  Location: [facility];  Service: Orthopedics;  Laterality: Right;  ? ? reports that she has never smoked. She has never used smokeless tobacco. She reports current alcohol use of about 4.0 standard drinks per week. She reports current drug use. Drug: Marijuana. ?family history includes Alcohol abuse in her maternal grandfather, paternal grandfather, and paternal grandmother; Cancer in her mother; Diabetes in her maternal aunt, maternal grandmother, and mother; Hyperlipidemia in her mother; Hypertension in her mother; Lung cancer in her maternal grandmother and paternal grandfather; Lymphoma in her mother; Osteoarthritis in her father and mother; Other in her father; Prostate cancer in her maternal grandfather. ?No Active Allergies ? ?Review of Systems  ?Constitutional:  Negative for chills and fever.  ?HENT:  Positive for congestion, ear pain, sinus pain and sore throat.   ?Respiratory:  Positive for cough. Negative for wheezing.   ? ?  ?Objective:  ?  ? ?BP 110/70 (BP Location: Left Arm, Patient Position: Sitting, Cuff Size: Normal)   Pulse 75   Temp (!) 97.3 ?F (36.3 ?C) (Oral)   Ht '5\' 4"'$  (1.626 m)   Wt 194 lb 8 oz (88.2  kg)   SpO2 99%   BMI 33.39 kg/m?  ? ? ?Physical Exam ?Vitals reviewed.  ?Constitutional:   ?   General: She is not in acute distress. ?   Appearance: She is well-developed. She is not ill-appearing.  ?HENT:  ?   Ears:  ?   Comments: Left eardrum is erythematous especially along the superior portion.  No visible perforation.  Right eardrum is normal in appearance ?   Mouth/Throat:  ?   Mouth: Mucous membranes are moist.  ?   Pharynx: Oropharynx is clear. No oropharyngeal exudate.  ?Cardiovascular:  ?   Rate and Rhythm: Normal rate and regular rhythm.  ?Pulmonary:  ?   Effort: Pulmonary effort is normal.  ?   Breath sounds: Normal breath sounds. No wheezing  or rales.  ?Musculoskeletal:  ?   Cervical back: Neck supple.  ?Lymphadenopathy:  ?   Cervical: No cervical adenopathy.  ?Neurological:  ?   Mental Status: She is alert.  ? ? ? ?Results for orders placed or performed in visit on 11/20/21  ?POC COVID-19 BinaxNow  ?Result Value Ref Range  ? SARS Coronavirus 2 Ag Negative Negative  ?POCT rapid strep A  ?Result Value Ref Range  ? Rapid Strep A Screen Negative Negative  ? ? ? ? ?The 10-year ASCVD risk score (Arnett DK, et al., 2019) is: 1.6% ? ?  ?Assessment & Plan:  ? ?Patient presents with over 2-week history of upper respiratory symptoms as above.  She does have evidence on exam for acute left otitis media and suspected acute sinusitis.  COVID and rapid strep testing negative. ? ?-Given ear findings above and persistent sinusitis symptoms start Augmentin 875 mg twice daily for 10 days. ?-Follow-up for any persistent or worsening symptoms. ? ? ?No follow-ups on file.  ? ? ?Carolann Littler, MD ? ?

## 2021-11-29 ENCOUNTER — Encounter: Payer: Self-pay | Admitting: Family Medicine

## 2022-02-06 ENCOUNTER — Other Ambulatory Visit: Payer: Self-pay | Admitting: Psychiatry

## 2022-02-06 ENCOUNTER — Other Ambulatory Visit: Payer: Self-pay | Admitting: Family Medicine

## 2022-02-06 ENCOUNTER — Encounter: Payer: Self-pay | Admitting: Psychiatry

## 2022-02-06 MED ORDER — ZONISAMIDE 50 MG PO CAPS: 50.0000 mg | ORAL_CAPSULE | Freq: Two times a day (BID) | ORAL | 6 refills | Status: DC

## 2022-02-06 NOTE — Telephone Encounter (Signed)
I sent in a new rx for her, thanks

## 2022-02-11 LAB — HM PAP SMEAR: HM Pap smear: NORMAL

## 2022-02-17 ENCOUNTER — Ambulatory Visit
Admission: RE | Admit: 2022-02-17 | Discharge: 2022-02-17 | Disposition: A | Discharge status: Home / Self Care | Payer: 59 | Source: Ambulatory Visit

## 2022-02-17 ENCOUNTER — Ambulatory Visit (INDEPENDENT_AMBULATORY_CARE_PROVIDER_SITE_OTHER): Payer: 59

## 2022-02-17 VITALS — BP 130/84 | HR 87 | Temp 98.4°F | Resp 14

## 2022-02-17 DIAGNOSIS — R3129 Other microscopic hematuria: Secondary | ICD-10-CM

## 2022-02-17 DIAGNOSIS — R109 Unspecified abdominal pain: Secondary | ICD-10-CM

## 2022-02-17 DIAGNOSIS — N200 Calculus of kidney: Secondary | ICD-10-CM

## 2022-02-17 LAB — POCT URINALYSIS DIP (MANUAL ENTRY)
Bilirubin, UA: NEGATIVE
Glucose, UA: NEGATIVE mg/dL
Ketones, POC UA: NEGATIVE mg/dL
Leukocytes, UA: NEGATIVE
Nitrite, UA: NEGATIVE
Protein Ur, POC: NEGATIVE mg/dL
Spec Grav, UA: 1.025 (ref 1.010–1.025)
Urobilinogen, UA: 0.2 E.U./dL
pH, UA: 5.5 (ref 5.0–8.0)

## 2022-02-17 MED ORDER — CEFADROXIL 500 MG PO CAPS: 500.0000 mg | ORAL_CAPSULE | Freq: Two times a day (BID) | ORAL | 0 refills | Status: AC

## 2022-02-17 MED ORDER — HYDROCODONE-ACETAMINOPHEN 5-325 MG PO TABS: 1.0000 | ORAL_TABLET | Freq: Every day | ORAL | 0 refills | Status: DC | PRN

## 2022-02-17 NOTE — ED Triage Notes (Signed)
Pt presents with stabbing pain in her lower back that began yesterday. Pt states she has had decreased output and dark urine color. Pt has hx or kidney stones and pyelonephritis.

## 2022-02-17 NOTE — Discharge Instructions (Addendum)
Advised patient to take medication as directed with food to completion.  Advised may use Norco for breakthrough left flank/left kidney stone pain as needed.  Encouraged patient increase daily water intake while taking these medications.  Advised if left flank pain worsens and/or unresolved please follow-up with PCP, go ED, or here for further evaluation.

## 2022-02-17 NOTE — ED Provider Notes (Signed)
[name]    CSN: [id number] Arrival date & time: 02/17/22  9323      History   Chief Complaint Chief Complaint  Patient presents with   Back Pain    I think I might have a kidney stone. I have stabbing pain in my lower left back when I urinate, I've been having intermittent dark urine for the past week. - Entered by patient    HPI Katie [name] is a 41 y.o. female.   HPI Pleasant 41 year old female is concerned with kidney stone, reports stabbing lower back pain and with intermittent dark urine for 1 week.  PMH significant for kidney stone, fibromyalgia, and interstitial cystitis.  Past Medical History:  Diagnosis Date   Allergy    Anxiety    currently on luvox, hydroxyzine   Asthma    Bipolar affective disorder ([place])    onogoing Dr Nicholaus Corolla treatment for the past 10 years   Chronic fatigue disorder    Chronic migraine without aura    Depression    Fibromyalgia    GERD (gastroesophageal reflux disease)    History of kidney stones    highschool   History of migraine headaches    Insomnia    Interstitial cystitis    Iron deficiency anemia 02/23/2015   Migraines    teenage onset with periods   Panic attacks    Suicide attempt [facility])    Vision changes     Patient Active Problem List   Diagnosis Date Noted   Vitamin D deficiency, unspecified 09/07/2019   Obesity, Class I, BMI 30-34.9 09/07/2019   Iron deficiency anemia 02/23/2015   Obsessive compulsive disorder 02/22/2015   Postpartum care following cesarean delivery (8/11) 02/22/2015   Chronic low back pain 07/18/2014   Nonallopathic lesion of lumbosacral region 07/18/2014   Nonallopathic lesion of sacral region 07/18/2014   Nonallopathic lesion of thoracic region 07/18/2014   Chronic cholecystitis with calculus 03/06/2014   Abdominal pain, epigastric 02/06/2014   Facial pain 12/21/2012   Neck fullness 08/16/2012   Chronic fatigue fibromyalgia syndrome 01/23/2012   Wrist pain, chronic, left  12/17/2011   Neck pain on right side 12/17/2011   Fibromyalgia 11/26/2011   Cramps, extremity 08/13/2011   Paraesthesia of skin 08/13/2011   Bipolar disorder ([place]) 10/14/2010   GERD (gastroesophageal reflux disease) 10/14/2010   Arthralgia 10/10/2010    Past Surgical History:  Procedure Laterality Date   ADENOIDECTOMY     1990   CESAREAN SECTION N/A 02/22/2015   Procedure: CESAREAN SECTION;  Surgeon: Brien Few, MD;  Location: [facility];  Service: Obstetrics;  Laterality: N/A;   CHOLECYSTECTOMY     HEMANGIOMA EXCISION     2009 removal from right ar   KNEE SURGERY     left knee torn cartilage removed 1994   MASS EXCISION Right 11/05/2018   Procedure: EXCISION MASS RIGHT FOREARM;  Surgeon: Charlotte Crumb, MD;  Location: [facility];  Service: Orthopedics;  Laterality: Right;    OB History     Gravida  1   Para  1   Term  1   Preterm      AB      Living  1      SAB      IAB      Ectopic      Multiple  0   Live Births  1            Home Medications    Prior to Admission  medications   Medication Sig Start Date End Date Taking? Authorizing Provider  cariprazine (VRAYLAR) 1.5 MG capsule Take by mouth.   Yes [provider]  cefadroxil (DURICEF) 500 MG capsule Take 1 capsule (500 mg total) by mouth 2 (two) times daily for 7 days. 02/17/22 02/24/22 Yes Eliezer Lofts, FNP  HYDROcodone-acetaminophen (NORCO/VICODIN) 5-325 MG tablet Take 1 tablet by mouth daily as needed. 02/17/22  Yes Eliezer Lofts, FNP  fluvoxaMINE (LUVOX) 100 MG tablet TAKE ONE TABLET BY MOUTH TWICE DAILY 12/13/14   [provider]  Galcanezumab-gnlm (EMGALITY) 120 MG/ML SOAJ Inject 1 pen. into the skin every 30 (thirty) days. 10/09/21   Genia Harold, MD  omeprazole (PRILOSEC) 40 MG capsule Take 40 mg by mouth daily.    Joline Salt, RN  traZODone (DESYREL) 50 MG tablet Take 50 mg by mouth at bedtime.    Joline Salt, RN  Ubrogepant (UBRELVY) 100 MG TABS  Take 100 mg by mouth as needed. Can repeat a dose in 2 hours if headache persists. Max dose 2 pills in 24 hours 10/09/21   Genia Harold, MD  zonisamide (ZONEGRAN) 50 MG capsule Take 1 capsule (50 mg total) by mouth in the morning and at bedtime. 02/06/22   Genia Harold, MD    Family History Family History  Problem Relation Age of Onset   Osteoarthritis Mother    Lymphoma Mother        Non-Hodgkin's lymphoma   Hyperlipidemia Mother    Hypertension Mother    Diabetes Mother        type 2   Cancer Mother        breast   Osteoarthritis Father    Other Father        Depression.anxiety   Lung cancer Maternal Grandmother    Diabetes Maternal Grandmother    Alcohol abuse Maternal Grandfather    Prostate cancer Maternal Grandfather    Alcohol abuse Paternal Grandmother    Alcohol abuse Paternal Grandfather    Lung cancer Paternal Grandfather    Diabetes Maternal Aunt     Social History Social History   Tobacco Use   Smoking status: Never   Smokeless tobacco: Never  Substance Use Topics   Alcohol use: Yes    Alcohol/week: 4.0 standard drinks of alcohol    Types: 4 Cans of beer per week    Comment: occas   Drug use: Yes    Types: Marijuana    Comment: 08/15/21 rarely     Allergies   Patient has no known allergies.   Review of Systems Review of Systems  Genitourinary:  Positive for flank pain, hematuria and urgency.  All other systems reviewed and are negative.    Physical Exam Triage Vital Signs ED Triage Vitals  Enc Vitals Group     BP      Pulse      Resp      Temp      Temp src      SpO2      Weight      Height      Head Circumference      Peak Flow      Pain Score      Pain Loc      Pain Edu?      Excl. in [place]?    No data found.  Updated Vital Signs BP 130/84 (BP Location: Right Arm)   Pulse 87   Temp 98.4 F (36.9 C) (Oral)   Resp 14  SpO2 97%    Physical Exam Vitals and nursing note reviewed.  Constitutional:      General: She is  not in acute distress.    Appearance: Normal appearance. She is obese. She is ill-appearing.  HENT:     Head: Normocephalic and atraumatic.     Mouth/Throat:     Mouth: Mucous membranes are moist.     Pharynx: Oropharynx is clear.  Eyes:     Extraocular Movements: Extraocular movements intact.     Conjunctiva/sclera: Conjunctivae normal.     Pupils: Pupils are equal, round, and reactive to light.  Cardiovascular:     Rate and Rhythm: Normal rate and regular rhythm.     Pulses: Normal pulses.     Heart sounds: Normal heart sounds.  Pulmonary:     Effort: Pulmonary effort is normal.     Breath sounds: Normal breath sounds. No wheezing, rhonchi or rales.  Abdominal:     Tenderness: There is left CVA tenderness.  Musculoskeletal:        General: Normal range of motion.     Cervical back: Normal range of motion and neck supple.  Skin:    General: Skin is warm and dry.  Neurological:     General: No focal deficit present.     Mental Status: She is alert and oriented to person, place, and time.      UC Treatments / Results  Labs (all labs ordered are listed, but only abnormal results are displayed) Labs Reviewed  POCT URINALYSIS DIP (MANUAL ENTRY) - Abnormal; Notable for the following components:      Result Value   Blood, UA trace-intact (*)    All other components within normal limits  URINE CULTURE    EKG   Radiology CT Renal Stone Study  Result Date: 02/17/2022 CLINICAL DATA:  Flank pain.  Suspect renal stone. EXAM: CT ABDOMEN AND PELVIS WITHOUT CONTRAST TECHNIQUE: Multidetector CT imaging of the abdomen and pelvis was performed following the standard protocol without IV contrast. RADIATION DOSE REDUCTION: This exam was performed according to the departmental dose-optimization program which includes automated exposure control, adjustment of the mA and/or kV according to patient size and/or use of iterative reconstruction technique. COMPARISON:  None Available. FINDINGS:  Lower chest: Normal heart size. Lung bases are clear. No pleural effusion. Hepatobiliary: Liver is diffusely low in attenuation compatible with steatosis. Prior cholecystectomy. Pancreas: Unremarkable Spleen: Unremarkable Adrenals/Urinary Tract: Adrenal glands are normal. 2 mm stone midpole left kidney. Incompletely characterized 1.8 cm partially exophytic fluid attenuation lesion inferior pole right kidney (image 45; series 2). The urinary bladder is decompressed. No ureterolithiasis. No hydronephrosis. Stomach/Bowel: Normal morphology of the stomach. No abnormal bowel wall thickening or evidence for bowel obstruction. No free fluid or free intraperitoneal air. Vascular/Lymphatic: Normal caliber abdominal aorta. No retroperitoneal lymphadenopathy. Reproductive: There is a 2.7 cm simple cyst within the left ovary. Normal appearance of the right ovary. Uterus is unremarkable. Other: None. Musculoskeletal: Lumbar spine degenerative changes. No aggressive or acute appearing osseous lesions. IMPRESSION: 1. 2 mm stone midpole left kidney. No ureterolithiasis. No hydronephrosis. 2. Hepatic steatosis. 3. 1.8 cm right Bosniak 2 benign cyst. No follow-up imaging is recommended. JACR 2018 Feb; 264-273, Management of the Incidental RenalMass on CT, [employer] 2021; 814-848, Bosniak Classification of Cystic Renal Masses, Version 2019. 4. 2.7 cm left ovarian simple-appearing cyst. No follow-up imaging is recommended. Reference: JACR 2020 Feb;17(2):248-254 Electronically Signed   By: Lovey Newcomer M.D.   On: 02/17/2022 10:31  Procedures Procedures (including critical care time)  Medications Ordered in UC Medications - No data to display  Initial Impression / Assessment and Plan / UC Course  I have reviewed the triage vital signs and the nursing notes.  Pertinent labs & imaging results that were available during my care of the patient were reviewed by me and considered in my medical decision making (see chart for  details).     MDM: 1.  Left flank pain-UA revealed above, CT renal stone study revealed above, work note provided to patient prior to discharge this morning; 2.  Hematuria, microscopic-Rx'd Duricef; 3.  Kidney stone on the left side-advised/informed patient of renal stone study today with hard copy provided to patient, Rx'd Norco. Advised patient to take medication as directed with food to completion.  Advised may use Norco for breakthrough left flank/left kidney stone pain as needed.  Encouraged patient increase daily water intake while taking these medications.  Advised if left flank pain worsens and/or unresolved please follow-up with PCP, go ED, or here for further evaluation.  Final Clinical Impressions(s) / UC Diagnoses   Final diagnoses:  Left flank pain  Hematuria, microscopic  Kidney stone on left side     Discharge Instructions      Advised patient to take medication as directed with food to completion.  Advised may use Norco for breakthrough left flank/left kidney stone pain as needed.  Encouraged patient increase daily water intake while taking these medications.  Advised if left flank pain worsens and/or unresolved please follow-up with PCP, go ED, or here for further evaluation.     ED Prescriptions     Medication Sig Dispense Auth. Provider   cefadroxil (DURICEF) 500 MG capsule Take 1 capsule (500 mg total) by mouth 2 (two) times daily for 7 days. 14 capsule Eliezer Lofts, FNP   HYDROcodone-acetaminophen (NORCO/VICODIN) 5-325 MG tablet Take 1 tablet by mouth daily as needed. 21 tablet Eliezer Lofts, FNP      I have reviewed the PDMP during this encounter.   Eliezer Lofts, [place] 02/17/22 1055

## 2022-02-18 ENCOUNTER — Telehealth: Payer: Self-pay | Admitting: Emergency Medicine

## 2022-02-18 LAB — URINE CULTURE

## 2022-02-18 NOTE — Telephone Encounter (Signed)
Call to Aliany to see how she was doing today - message left - no answer

## 2022-03-05 ENCOUNTER — Ambulatory Visit (INDEPENDENT_AMBULATORY_CARE_PROVIDER_SITE_OTHER): Payer: 59 | Admitting: Psychiatry

## 2022-03-05 ENCOUNTER — Encounter: Payer: Self-pay | Admitting: Psychiatry

## 2022-03-05 VITALS — BP 109/71 | HR 78 | Ht 64.0 in | Wt 200.0 lb

## 2022-03-05 DIAGNOSIS — G43119 Migraine with aura, intractable, without status migrainosus: Secondary | ICD-10-CM

## 2022-03-05 MED ORDER — NURTEC 75 MG PO TBDP: 75.0000 mg | ORAL_TABLET | ORAL | 6 refills | Status: DC | PRN

## 2022-03-05 NOTE — Progress Notes (Signed)
   CC:  headaches  Follow-up Visit  Last visit: 10/09/21  Brief HPI: 41 year old female with a history of nephrolithiasis, migraines, bipolar disorder, fibromyalgia, and GERD who follows in clinic for vision changes and migraines.   At her last visit she was started on Emgality for migraine prevention and Ubrelvy for rescue. Magnesium was increased to 800 mg daily. She was continued on Zonisamide.  Interval History: Her migraines have improved significantly since her last visit. She has only had one migraine this month. Will occasionally get mild lower level headaches but these are much less frequent since starting Emgality. Uses flexeril for mild headaches and Ubrelvy for her migraines. Roselyn Meier helps but makes her feel "off". She will still occasionally get a visual aura, but this only occurs with her migraines. Otherwise she has had no more blacking out or blurring of her vision.   She was found to have a kidney stone on 02/17/22.  Headache days per month: 1 Headache free days per month: 29  Current Headache Regimen: Preventative: Emgality 120 mg monthly, zonisamide 50 mg BID Abortive: Ubrelvy 100 mg PRN, flexeril 5 mg TID PRN   Prior Therapies                                  Imitrex Zomig nasal spray Ubrelvy - helped but caused side effects Excedrin Amitriptyline - mood changes Topamax - nausea Zonisamide 100 mg daily - nausea, kidney stones Lamictal - dysuria Emgality Cannot take NSAIDs due to chronic lithium therapy  Physical Exam:   Vital Signs: BP 109/71   Pulse 78   Ht '5\' 4"'$  (1.626 m)   Wt 200 lb (90.7 kg)   BMI 34.33 kg/m  GENERAL:  well appearing, in no acute distress, alert  SKIN:  Color, texture, turgor normal. No rashes or lesions HEAD:  Normocephalic/atraumatic. RESP: normal respiratory effort MSK:  No gross joint deformities.   NEUROLOGICAL: Mental Status: Alert, oriented to person, place and time, Follows commands, and Speech fluent and  appropriate. Cranial Nerves: PERRL, face symmetric, no dysarthria, hearing grossly intact Motor: moves all extremities equally Gait: normal-based.  IMPRESSION: 41 year old female with a history of nephrolithiasis, migraines, bipolar disorder, fibromyalgia, and GERD who presents for follow up of migraines and vision changes. Her symptoms have improved dramatically with Emgality and she is very happy with her current headache control. Will trial weaning off of zonisamide given her recent kidney stone. Roselyn Meier helps but makes her feel "off". Will try Nurtec for rescue and see if this is better tolerated.  PLAN: -Decrease zonisamide to 50 mg QHS x1 week then stop -Prevention: Continue Emgality 120 mg monthly -Rescue: Start Nurtec 75 mg PRN. Continue Flexeril 5 mg TID PRN  Follow-up: 7 months  I spent a total of 20 minutes on the date of the service. Headache education was done. Discussed medication side effects, adverse reactions and drug interactions. Written educational materials and patient instructions outlining all of the above were given.  Genia Harold, MD 03/05/22 1:48 PM

## 2022-03-05 NOTE — Patient Instructions (Signed)
Decrease zonisamide to 50 mg at bedtime for one week, then stop it

## 2022-03-06 ENCOUNTER — Telehealth: Payer: Self-pay

## 2022-03-06 ENCOUNTER — Encounter: Payer: Self-pay | Admitting: *Deleted

## 2022-03-06 NOTE — Telephone Encounter (Signed)
PA submitted via CMM Key: [id number] - Rx #: [id number] PA Case ID: [id number] Your information has been sent to [employer]. Awaiting determination.

## 2022-03-06 NOTE — Telephone Encounter (Signed)
Approved today PA Case: [id number], Status: Approved, Coverage Starts on: 03/06/2022 12:00:00 AM, Coverage Ends on: 03/06/2023 12:00:00 AM. Sent my chart to inform her.

## 2022-03-08 ENCOUNTER — Other Ambulatory Visit: Payer: Self-pay | Admitting: Psychiatry

## 2022-03-10 ENCOUNTER — Other Ambulatory Visit: Payer: Self-pay | Admitting: *Deleted

## 2022-03-10 ENCOUNTER — Other Ambulatory Visit: Payer: Self-pay | Admitting: Psychiatry

## 2022-03-10 ENCOUNTER — Encounter: Payer: Self-pay | Admitting: Psychiatry

## 2022-03-10 MED ORDER — CYCLOBENZAPRINE HCL 5 MG PO TABS: 5.0000 mg | ORAL_TABLET | Freq: Three times a day (TID) | ORAL | 1 refills | Status: DC | PRN

## 2022-03-10 MED ORDER — EMGALITY 120 MG/ML ~~LOC~~ SOAJ: 1.0000 "pen " | SUBCUTANEOUS | 3 refills | Status: DC

## 2022-04-30 ENCOUNTER — Other Ambulatory Visit: Payer: Self-pay

## 2022-05-18 ENCOUNTER — Other Ambulatory Visit: Payer: Self-pay | Admitting: Psychiatry

## 2022-07-04 ENCOUNTER — Other Ambulatory Visit: Payer: Self-pay | Admitting: Psychiatry

## 2022-08-01 ENCOUNTER — Other Ambulatory Visit: Payer: Self-pay | Admitting: Psychiatry

## 2022-08-25 ENCOUNTER — Telehealth: Payer: 59 | Admitting: Nurse Practitioner

## 2022-08-25 DIAGNOSIS — J329 Chronic sinusitis, unspecified: Secondary | ICD-10-CM | POA: Diagnosis not present

## 2022-08-25 DIAGNOSIS — B9789 Other viral agents as the cause of diseases classified elsewhere: Secondary | ICD-10-CM | POA: Diagnosis not present

## 2022-08-25 DIAGNOSIS — J069 Acute upper respiratory infection, unspecified: Secondary | ICD-10-CM | POA: Diagnosis not present

## 2022-08-25 MED ORDER — FLUTICASONE PROPIONATE 50 MCG/ACT NA SUSP: 2.0000 | Freq: Every day | NASAL | 6 refills | Status: AC

## 2022-08-25 NOTE — Progress Notes (Signed)
E-Visit for Sinus Problems  We are sorry that you are not feeling well.  Here is how we plan to help!  Based on what you have shared with me it looks like you have sinusitis.  Sinusitis is inflammation and infection in the sinus cavities of the head.  Based on your presentation I believe you most likely have Acute Viral Sinusitis.This is an infection most likely caused by a virus. There is not specific treatment for viral sinusitis other than to help you with the symptoms until the infection runs its course.  You may use an oral decongestant such as Mucinex D or if you have glaucoma or high blood pressure use plain Mucinex. Saline nasal spray help and can safely be used as often as needed for congestion, I have prescribed: Fluticasone nasal spray two sprays in each nostril once a day  Providers prescribe antibiotics to treat infections caused by bacteria. Antibiotics are very powerful in treating bacterial infections when they are used properly. To maintain their effectiveness, they should be used only when necessary. Overuse of antibiotics has resulted in the development of superbugs that are resistant to treatment!    After careful review of your answers, I would not recommend an antibiotic for your condition.  Antibiotics are not effective against viruses and therefore should not be used to treat them. Common examples of infections caused by viruses include colds and flu   Some authorities believe that zinc sprays or the use of Echinacea may shorten the course of your symptoms.  Sinus infections are not as easily transmitted as other respiratory infection, however we still recommend that you avoid close contact with loved ones, especially the very young and elderly.  Remember to wash your hands thoroughly throughout the day as this is the number one way to prevent the spread of infection!  Home Care: Only take medications as instructed by your medical team. Do not take these medications with  alcohol. A steam or ultrasonic humidifier can help congestion.  You can place a towel over your head and breathe in the steam from hot water coming from a faucet. Avoid close contacts especially the very young and the elderly. Cover your mouth when you cough or sneeze. Always remember to wash your hands.  Get Help Right Away If: You develop worsening fever or sinus pain. You develop a severe head ache or visual changes. Your symptoms persist after you have completed your treatment plan.  Make sure you Understand these instructions. Will watch your condition. Will get help right away if you are not doing well or get worse.   Thank you for choosing an e-visit.  Your e-visit answers were reviewed by a board certified advanced clinical practitioner to complete your personal care plan. Depending upon the condition, your plan could have included both over the counter or prescription medications.  Please review your pharmacy choice. Make sure the pharmacy is open so you can pick up prescription now. If there is a problem, you may contact your provider through [employer] and have the prescription routed to another pharmacy.  Your safety is important to [place]. If you have drug allergies check your prescription carefully.   Meds ordered this encounter  Medications   fluticasone (FLONASE) 50 MCG/ACT nasal spray    Sig: Place 2 sprays into both nostrils daily.    Dispense:  16 g    Refill:  6     I spent approximately 5 minutes reviewing the patient's history, current symptoms and coordinating  their care today.

## 2022-09-16 NOTE — Progress Notes (Unsigned)
ACUTE VISIT No chief complaint on file.  HPI: [name] is a 42 y.o. female, who is here today complaining of *** HPI  Review of Systems See other pertinent positives and negatives in HPI.  Current Outpatient Medications on File Prior to Visit  Medication Sig Dispense Refill   cariprazine (VRAYLAR) 1.5 MG capsule Take by mouth.     cyclobenzaprine (FLEXERIL) 5 MG tablet TAKE 1 TABLET BY MOUTH EVERY 8 HOURS AS NEEDED FOR MUSCLE SPASMS. 30 tablet 1   fluticasone (FLONASE) 50 MCG/ACT nasal spray Place 2 sprays into both nostrils daily. 16 g 6   fluvoxaMINE (LUVOX) 100 MG tablet TAKE ONE TABLET BY MOUTH TWICE DAILY     Galcanezumab-gnlm (EMGALITY) 120 MG/ML SOAJ INJECT 1 PEN INTO THE SKIN EVERY 30 (THIRTY) DAYS. 1.12 mL 3   HYDROcodone-acetaminophen (NORCO/VICODIN) 5-325 MG tablet Take 1 tablet by mouth daily as needed. 21 tablet 0   omeprazole (PRILOSEC) 40 MG capsule Take 40 mg by mouth daily.     Rimegepant Sulfate (NURTEC) 75 MG TBDP Take 75 mg by mouth as needed. 8 tablet 6   traZODone (DESYREL) 50 MG tablet Take 50 mg by mouth at bedtime.     zonisamide (ZONEGRAN) 50 MG capsule Take 1 capsule (50 mg total) by mouth in the morning and at bedtime. 60 capsule 6   No current facility-administered medications on file prior to visit.    Past Medical History:  Diagnosis Date   Allergy    Anxiety    currently on luvox, hydroxyzine   Asthma    Bipolar affective disorder ([facility])    onogoing Dr Nicholaus Corolla treatment for the past 10 years   Chronic fatigue disorder    Chronic migraine without aura    Depression    Fibromyalgia    GERD (gastroesophageal reflux disease)    History of kidney stones    highschool   History of migraine headaches    Insomnia    Interstitial cystitis    Iron deficiency anemia 02/23/2015   Migraines    teenage onset with periods   Panic attacks    Suicide attempt ([place])    Vision changes    No Known Allergies  Social History   Socioeconomic  History   Marital status: Married    Spouse name: Larkin Ina   Number of children: 1   Years of education: Not on file   Highest education level: Some college, no degree  Occupational History   Occupation: Print production planner and nanny  Tobacco Use   Smoking status: Never   Smokeless tobacco: Never  Substance and Sexual Activity   Alcohol use: Yes    Alcohol/week: 4.0 standard drinks of alcohol    Types: 4 Cans of beer per week    Comment: occas   Drug use: Yes    Types: Marijuana    Comment: 08/15/21 rarely   Sexual activity: Yes    Birth control/protection: I.U.D.  Other Topics Concern   Not on file  Social History Narrative   Grew up in [place]   Married 5 yrs   Occupation - prev occupation.  Preschool teacher and nanny   Never smoked.  No etoh no drugs   Caffeine- soda 3-4 glasses daily   Social Determinants of Health   Financial Resource Strain: Low Risk  (11/20/2021)   Overall Financial Resource Strain (CARDIA)    Difficulty of Paying Living Expenses: Not very hard  Food Insecurity: No Food Insecurity (11/20/2021)   Hunger Vital  Sign    Worried About Charity fundraiser in the Last Year: Never true    [place] in the Last Year: Never true  Transportation Needs: No Transportation Needs (11/20/2021)   PRAPARE - Hydrologist (Medical): No    Lack of Transportation (Non-Medical): No  Physical Activity: Unknown (11/20/2021)   Exercise Vital Sign    Days of Exercise per Week: 0 days    Minutes of Exercise per Session: Not on file  Stress: Stress Concern Present (11/20/2021)   [employer]    Feeling of Stress : To some extent  Social Connections: Moderately Isolated (11/20/2021)   Social Connection and Isolation Panel [NHANES]    Frequency of Communication with Friends and Family: More than three times a week    Frequency of Social Gatherings with Friends and Family: Twice a week     Attends Religious Services: Never    Marine scientist or Organizations: No    Attends Music therapist: Not on file    Marital Status: Married    There were no vitals filed for this visit. There is no height or weight on file to calculate BMI.  Physical Exam  ASSESSMENT AND PLAN: There are no diagnoses linked to this encounter.  No follow-ups on file.  Neesha Langton G. [place], MD  [facility]. [place] office.  Discharge Instructions   None

## 2022-09-17 ENCOUNTER — Encounter: Payer: Self-pay | Admitting: Family Medicine

## 2022-09-17 ENCOUNTER — Ambulatory Visit (INDEPENDENT_AMBULATORY_CARE_PROVIDER_SITE_OTHER): Payer: 59 | Admitting: Family Medicine

## 2022-09-17 VITALS — BP 118/74 | HR 82 | Temp 98.3°F | Resp 12 | Ht 64.0 in | Wt 190.5 lb

## 2022-09-17 DIAGNOSIS — R2231 Localized swelling, mass and lump, right upper limb: Secondary | ICD-10-CM

## 2022-09-17 NOTE — Patient Instructions (Addendum)
A few things to remember from today's visit:  Mass of right axilla - Plan: MM 3D DIAGNOSTIC MAMMOGRAM BILATERAL BREAST Could be a benign subcutaneous lesions, ? Lipoma. Continue monitoring for changes.  Do not use My Chart to request refills or for acute issues that need immediate attention. If you send a my chart message, it may take a few days to be addressed, specially if I am not in the office.  Please be sure medication list is accurate. If a new problem present, please set up appointment sooner than planned today.

## 2022-09-18 ENCOUNTER — Other Ambulatory Visit: Payer: Self-pay | Admitting: Family Medicine

## 2022-09-18 DIAGNOSIS — R2231 Localized swelling, mass and lump, right upper limb: Secondary | ICD-10-CM

## 2022-09-30 ENCOUNTER — Telehealth: Payer: 59 | Admitting: Physician Assistant

## 2022-09-30 DIAGNOSIS — M549 Dorsalgia, unspecified: Secondary | ICD-10-CM | POA: Diagnosis not present

## 2022-09-30 MED ORDER — MELOXICAM 7.5 MG PO TABS: 7.5000 mg | ORAL_TABLET | Freq: Every day | ORAL | 0 refills | Status: DC

## 2022-09-30 NOTE — Patient Instructions (Signed)
  Josem Kaufmann, thank you for joining Leeanne Rio, PA-C for today's virtual visit.  While this provider is not your primary care provider (PCP), if your PCP is located in our provider database this encounter information will be shared with them immediately following your visit.   [facility] account gives you access to today's visit and all your visits, tests, and labs performed at [facility] " click here if you don't have a [facility] account or go to [email or web address]  Consent: (Patient) Katie [name] provided verbal consent for this virtual visit at the beginning of the encounter.  Current Medications:  Current Outpatient Medications:    cariprazine (VRAYLAR) 1.5 MG capsule, Take by mouth., Disp: , Rfl:    cyclobenzaprine (FLEXERIL) 5 MG tablet, TAKE 1 TABLET BY MOUTH EVERY 8 HOURS AS NEEDED FOR MUSCLE SPASMS., Disp: 30 tablet, Rfl: 1   fluticasone (FLONASE) 50 MCG/ACT nasal spray, Place 2 sprays into both nostrils daily., Disp: 16 g, Rfl: 6   fluvoxaMINE (LUVOX) 100 MG tablet, TAKE ONE TABLET BY MOUTH TWICE DAILY, Disp: , Rfl:    Galcanezumab-gnlm (EMGALITY) 120 MG/ML SOAJ, INJECT 1 PEN INTO THE SKIN EVERY 30 (THIRTY) DAYS., Disp: 1.12 mL, Rfl: 3   omeprazole (PRILOSEC) 40 MG capsule, Take 40 mg by mouth daily., Disp: , Rfl:    Rimegepant Sulfate (NURTEC) 75 MG TBDP, Take 75 mg by mouth as needed., Disp: 8 tablet, Rfl: 6   traZODone (DESYREL) 50 MG tablet, Take 50 mg by mouth at bedtime., Disp: , Rfl:    Medications ordered in this encounter:  No orders of the defined types were placed in this encounter.    *If you need refills on other medications prior to your next appointment, please contact your pharmacy*  Follow-Up: Call back or seek an in-person evaluation if the symptoms worsen or if the condition fails to improve as anticipated.  [place] [phone]  Other Instructions Please avoid heavy lifting and  overexertion. Apply heating pad in 15 minute intervals. Ok to continue OTC Tylenol.  Take meloxicam once daily as directed. Ok to use your cyclobenzaprine up to 10 mg every 8 hours as needed for spasm.  No driving or operating heavy machinery while on the medications.  Follow-up in person if symptoms are not resolving or any new/worsening symptoms.    If you have been instructed to have an in-person evaluation today at a local Urgent Care facility, please use the link below. It will take you to a list of all of our available [facility], including address, phone number and hours of operation. Please do not delay care.  [facility]  If you or a family member do not have a primary care provider, use the link below to schedule a visit and establish care. When you choose a [facility] primary care physician or advanced practice provider, you gain a long-term partner in health. Find a Primary Care Provider  Learn more about [facility]'s in-office and virtual care options: [facility]

## 2022-09-30 NOTE — Progress Notes (Signed)
Virtual Visit Consent   MAESIE REICHE, you are scheduled for a virtual visit with a [facility] provider today. Just as with appointments in the office, your consent must be obtained to participate. Your consent will be active for this visit and any virtual visit you may have with one of our providers in the next 365 days. If you have a MyChart account, a copy of this consent can be sent to you electronically.  As this is a virtual visit, video technology does not allow for your provider to perform a traditional examination. This may limit your provider's ability to fully assess your condition. If your provider identifies any concerns that need to be evaluated in person or the need to arrange testing (such as labs, EKG, etc.), we will make arrangements to do so. Although advances in technology are sophisticated, we cannot ensure that it will always work on either your end or our end. If the connection with a video visit is poor, the visit may have to be switched to a telephone visit. With either a video or telephone visit, we are not always able to ensure that we have a secure connection.  By engaging in this virtual visit, you consent to the provision of healthcare and authorize for your insurance to be billed (if applicable) for the services provided during this visit. Depending on your insurance coverage, you may receive a charge related to this service.  I need to obtain your verbal consent now. Are you willing to proceed with your visit today? Katie [name] has provided verbal consent on 09/30/2022 for a virtual visit (video or telephone). Leeanne Rio, [place]  Date: 09/30/2022 6:03 PM  Virtual Visit via Video Note   I, Leeanne Rio, connected with  Katie [name]  ([id number], 1980-08-24) on 09/30/22 at  5:45 PM EDT by a video-enabled telemedicine application and verified that I am speaking with the correct person using two identifiers.  Location: Patient: Virtual Visit Location  Patient: Home Provider: Virtual Visit Location Provider: Home Office   I discussed the limitations of evaluation and management by telemedicine and the availability of in person appointments. The patient expressed understanding and agreed to proceed.    History of Present Illness: Katie [name] is a 43 y.o. 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 Notes mid and upper back pain, bilateral with spasm when she makes certain movements. Has taken an OTC Ibuprofen. No neck pain. No cervical.   OTC -- Advil.   HPI: HPI  Problems:  Patient Active Problem List   Diagnosis Date Noted   Vitamin D deficiency, unspecified 09/07/2019   Obesity, Class I, BMI 30-34.9 09/07/2019   Iron deficiency anemia 02/23/2015   Obsessive compulsive disorder 02/22/2015   Postpartum care following cesarean delivery (8/11) 02/22/2015   Chronic low back pain 07/18/2014   Nonallopathic lesion of lumbosacral region 07/18/2014   Nonallopathic lesion of sacral region 07/18/2014   Nonallopathic lesion of thoracic region 07/18/2014   Chronic cholecystitis with calculus 03/06/2014   Abdominal pain, epigastric 02/06/2014   Facial pain 12/21/2012   Neck fullness 08/16/2012   Chronic fatigue fibromyalgia syndrome 01/23/2012   Wrist pain, chronic, left 12/17/2011   Neck pain on right side 12/17/2011   Fibromyalgia 11/26/2011   Cramps, extremity 08/13/2011   Paraesthesia of skin 08/13/2011   Bipolar disorder (  [place]) 10/14/2010   GERD (gastroesophageal reflux disease) 10/14/2010   Arthralgia 10/10/2010    Allergies: No Known Allergies Medications:  Current Outpatient Medications:    meloxicam (MOBIC) 7.5 MG tablet, Take 1 tablet (7.5 mg total) by mouth daily., Disp: 15 tablet, Rfl: 0   cariprazine (VRAYLAR) 1.5 MG capsule, Take by mouth., Disp: , Rfl:    cyclobenzaprine (FLEXERIL) 5 MG  tablet, TAKE 1 TABLET BY MOUTH EVERY 8 HOURS AS NEEDED FOR MUSCLE SPASMS., Disp: 30 tablet, Rfl: 1   fluticasone (FLONASE) 50 MCG/ACT nasal spray, Place 2 sprays into both nostrils daily., Disp: 16 g, Rfl: 6   fluvoxaMINE (LUVOX) 100 MG tablet, TAKE ONE TABLET BY MOUTH TWICE DAILY, Disp: , Rfl:    Galcanezumab-gnlm (EMGALITY) 120 MG/ML SOAJ, INJECT 1 PEN INTO THE SKIN EVERY 30 (THIRTY) DAYS., Disp: 1.12 mL, Rfl: 3   omeprazole (PRILOSEC) 40 MG capsule, Take 40 mg by mouth daily., Disp: , Rfl:    Rimegepant Sulfate (NURTEC) 75 MG TBDP, Take 75 mg by mouth as needed., Disp: 8 tablet, Rfl: 6   traZODone (DESYREL) 50 MG tablet, Take 50 mg by mouth at bedtime., Disp: , Rfl:   Observations/Objective: Patient is well-developed, well-nourished in no acute distress.  Resting comfortably at home.  Head is normocephalic, atraumatic.  No labored breathing. Speech is clear and coherent with logical content.  Patient is alert and oriented at baseline.   Assessment and Plan: 1. Acute upper back pain - meloxicam (MOBIC) 7.5 MG tablet; Take 1 tablet (7.5 mg total) by mouth daily.  Dispense: 15 tablet; Refill: 0  With spasm. Has cyclobenzaprine at home. Can take up to 10 mg every 8 hours as needed for spasm. No driving or operating machinery while on medication. Meloxicam once daily. If causing increased GERD, can stop and resume Advil OTC. Ok to use OTC Tylenol as needed. Heating pad in 15-minute intervals. If not quickly resolving or any new/worsening symptoms, needs in-person evaluation.   Follow Up Instructions: I discussed the assessment and treatment plan with the patient. The patient was provided an opportunity to ask questions and all were answered. The patient agreed with the plan and demonstrated an understanding of the instructions.  A copy of instructions were sent to the patient via MyChart unless otherwise noted below.   The patient was advised to call back or seek an in-person evaluation if the  symptoms worsen or if the condition fails to improve as anticipated.  Time:  I spent 10 minutes with the patient via telehealth technology discussing the above problems/concerns.    Leeanne Rio, PA-C

## 2022-10-06 ENCOUNTER — Ambulatory Visit (INDEPENDENT_AMBULATORY_CARE_PROVIDER_SITE_OTHER): Payer: 59 | Admitting: Adult Health

## 2022-10-06 ENCOUNTER — Encounter: Payer: Self-pay | Admitting: Adult Health

## 2022-10-06 VITALS — BP 128/73 | HR 90 | Ht 64.0 in | Wt 188.4 lb

## 2022-10-06 DIAGNOSIS — G43E09 Chronic migraine with aura, not intractable, without status migrainosus: Secondary | ICD-10-CM | POA: Diagnosis not present

## 2022-10-06 MED ORDER — EMGALITY 120 MG/ML ~~LOC~~ SOAJ: 1.0000 "pen " | SUBCUTANEOUS | 11 refills | Status: DC

## 2022-10-06 MED ORDER — NURTEC 75 MG PO TBDP: 75.0000 mg | ORAL_TABLET | ORAL | 11 refills | Status: DC | PRN

## 2022-10-06 NOTE — Progress Notes (Signed)
CC:  headaches Chief Complaint  Patient presents with   Follow-up    Patient in room #3 and alone. Patient her migraines has gotten better, very life changes for her.     Follow-up Visit  Last visit: 03/05/2022 with Dr. Billey Gosling  Brief HPI: 42 year old female with a history of nephrolithiasis, migraines, bipolar disorder, fibromyalgia, and GERD who follows in clinic for vision changes and migraines.   At her last visit, noted significant improvement of migraines on Emgality, recommended continuation of Emgality and magnesium and gradually taper off of zonisamide and started on Nurtec for rescue.    Interval History:  Continues on Emgality with great control of migraine headaches.  Currently experiencing about 4 migraines per month, will use Nurtec with resolution of migraine.  She has since completely stopped zonisamide and denies any worsening.  Does have prescription for Flexeril as needed but has not needed recently.     Headache days per month: 4 Headache free days per month: 26  Current Headache Regimen: Preventative: Emgality 120 mg monthly Abortive: Nurtec 75 mg PRN, flexeril 5 mg TID PRN   Prior Therapies                                  Imitrex Zomig nasal spray Ubrelvy - helped but caused side effects Excedrin Amitriptyline - mood changes Topamax - nausea Zonisamide 100 mg daily - nausea, kidney stones Lamictal - dysuria Emgality Cannot take NSAIDs due to chronic lithium therapy   Current Outpatient Medications on File Prior to Visit  Medication Sig Dispense Refill   cariprazine (VRAYLAR) 1.5 MG capsule Take by mouth.     cyclobenzaprine (FLEXERIL) 5 MG tablet TAKE 1 TABLET BY MOUTH EVERY 8 HOURS AS NEEDED FOR MUSCLE SPASMS. 30 tablet 1   fluticasone (FLONASE) 50 MCG/ACT nasal spray Place 2 sprays into both nostrils daily. 16 g 6   fluvoxaMINE (LUVOX) 100 MG tablet TAKE ONE TABLET BY MOUTH TWICE DAILY     omeprazole (PRILOSEC) 40 MG capsule Take 40 mg by  mouth daily.     traZODone (DESYREL) 50 MG tablet Take 50 mg by mouth at bedtime.     No current facility-administered medications on file prior to visit.   Past Medical History:  Diagnosis Date   Allergy    Anxiety    currently on luvox, hydroxyzine   Asthma    Bipolar affective disorder ([place])    onogoing Dr [facility]ol[facility] [place] treatment for the past 10 years   Chronic fatigue disorder    Chronic migraine without aura    Depression    Fibromyalgia    GERD (gastroesophageal reflux disease)    History of kidney stones    highschool   History of migraine headaches    Insomnia    Interstitial cystitis    Iron deficiency anemia 02/23/2015   Migraines    teenage onset with periods   Panic attacks    Suicide attempt [facility])    Vision changes    Past Surgical History:  Procedure Laterality Date   [place] N/A 02/22/2015   Procedure: CESAREAN SECTION;  Surgeon: Brien Few, MD;  Location: [facility];  Service: Obstetrics;  Laterality: N/A;   CHOLECYSTECTOMY     HEMANGIOMA EXCISION     2009 removal from right ar   KNEE SURGERY     left knee torn cartilage removed 1994  MASS EXCISION Right 11/05/2018   Procedure: EXCISION MASS RIGHT FOREARM;  Surgeon: Charlotte Crumb, MD;  Location: [facility];  Service: Orthopedics;  Laterality: Right;      Physical Exam:   Vital Signs: BP 128/73 (BP Location: Right Arm, Patient Position: Sitting, Cuff Size: Normal)   Pulse 90   Ht 5\' 4"  (1.626 m)   Wt 188 lb 6.4 oz (85.5 kg)   LMP 08/27/2022 (Exact Date)   BMI 32.34 kg/m  GENERAL:  well appearing, in no acute distress, alert  SKIN:  Color, texture, turgor normal. No rashes or lesions HEAD:  Normocephalic/atraumatic. RESP: normal respiratory effort MSK:  No gross joint deformities.   NEUROLOGICAL: Mental Status: Alert, oriented to person, place and time, Follows commands, and Speech fluent and appropriate. Cranial Nerves: PERRL, face symmetric,  no dysarthria, hearing grossly intact Motor: moves all extremities equally Gait: normal-based.       IMPRESSION: 42 year old female with a history of nephrolithiasis, migraines, bipolar disorder, fibromyalgia, and GERD who presents for follow up of migraines and vision changes. Her symptoms have improved dramatically with Emgality and she is very happy with her current headache control.  Use of Nurtec with benefit.   PLAN: -Prevention: Continue Emgality 120 mg monthly -Rescue: Continue Nurtec 75 mg PRN. Continue Flexeril 5 mg TID PRN    Follow-up in 1 year or call earlier if needed   I spent 15 minutes of face-to-face and non-face-to-face time with patient.  This included previsit chart review, lab review, study review, order entry, electronic health record documentation, patient education and discussion regarding the above and answered all other questions to patient's satisfaction  Frann Rider, [facility]  [facility] [address] [place], [place] [address]  Phone [phone] Fax [phone] Note: This document was prepared with digital dictation and possible smart phrase technology. Any transcriptional errors that result from this process are unintentional.

## 2022-10-06 NOTE — Patient Instructions (Addendum)
Your Plan:  Continue Emgality monthly injection for prevention  Continue Nurtec as needed for rescue  Use of Flexeril as needed     Follow up in 1 year or call earlier if needed     Thank you for coming to see [place] at [facility]. I hope we have been able to provide you high quality care today.  You may receive a patient satisfaction survey over the next few weeks. We would appreciate your feedback and comments so that we may continue to improve ourselves and the health of our patients.

## 2022-10-06 NOTE — Progress Notes (Signed)
HPI: [name] is a 42 y.o. female with PMHx significant for HLD, bipolar disorder,goiter,iron def anemia,and vit D def here today for her routine physical.  Last CPE: 2021  She reports success with a low-carb diet initiated in January, resulting in a 15-pound weight loss. Her dietary habits include daily vegetable consumption and home-cooked meals. She averages 7-8 hours of sleep per night, denies smoking, and mentions occasional alcohol intake, typically one or two beers every couple of weeks.  Immunization History  Administered Date(s) Administered   DTaP 07/15/2006   Influenza,inj,Quad PF,6+ Mos 03/21/2013, 03/27/2018   Influenza-Unspecified 05/13/2015, 03/09/2022   PFIZER(Purple Top)SARS-COV-2 Vaccination 09/29/2019, 10/24/2019   Tdap 07/15/2006, 06/27/2018   Health Maintenance  Topic Date Due   Hepatitis C Screening  Never done   COVID-19 Vaccine (3 - 2023-24 season) 10/23/2022 (Originally 03/14/2022)   PAP SMEAR-Modifier  03/28/2024   DTaP/Tdap/Td (4 - Td or Tdap) 06/27/2028   INFLUENZA VACCINE  Completed   HIV Screening  Completed   HPV VACCINES  Aged Out   she had a pap smear in August 2023 with Dr. Edwyna [name] at [facility] and is scheduled for a mammogram later today. She reports irregular menstrual periods following the removal of her IUD, LMP 08/27/22.  Goiter, previously managed with lithium for 20 years. An ultrasound in 10/2019 showed mildly enlarged thyroid.  Vitamin D def: She has not been taking vitamin D supplements. 25 OH vit D was 10.4 in 08/2019.  HLD: She is not on pharmacologic treatment.  Lab Results  Component Value Date   CHOL 239 (H) 08/28/2021   HDL 34 (L) 08/28/2021   LDLCALC Comment (A) 08/28/2021   LDLDIRECT 119 (H) 09/09/2021   TRIG 831 (HH) 08/28/2021   CHOLHDL 7.0 (H) 08/28/2021   Lab Results  Component Value Date   WBC 7.1 09/07/2019   HGB 13.3 09/07/2019   HCT 39.3 09/07/2019   MCV 88.1 09/07/2019   PLT 316.0 09/07/2019    Review of Systems  Constitutional:  Positive for fatigue. Negative for activity change, appetite change and fever.  HENT:  Negative for hearing loss, mouth sores, sore throat and trouble swallowing.   Eyes:  Negative for redness and visual disturbance.  Respiratory:  Negative for cough, shortness of breath and wheezing.   Cardiovascular:  Negative for chest pain and leg swelling.  Gastrointestinal:  Negative for abdominal pain, nausea and vomiting.       No changes in bowel habits.  Endocrine: Negative for cold intolerance, heat intolerance, polydipsia, polyphagia and polyuria.  Genitourinary:  Negative for decreased urine volume, dysuria, hematuria, vaginal bleeding and vaginal discharge.  Musculoskeletal:  Negative for gait problem and myalgias.  Skin:  Negative for color change and rash.  Allergic/Immunologic: Positive for environmental allergies.  Neurological:  Negative for syncope, weakness and headaches.  Hematological:  Negative for adenopathy. Does not bruise/bleed easily.  Psychiatric/Behavioral:  Negative for confusion and hallucinations.   All other systems reviewed and are negative.  Current Outpatient Medications on File Prior to Visit  Medication Sig Dispense Refill   cariprazine (VRAYLAR) 1.5 MG capsule Take by mouth.     cyclobenzaprine (FLEXERIL) 5 MG tablet TAKE 1 TABLET BY MOUTH EVERY 8 HOURS AS NEEDED FOR MUSCLE SPASMS. 30 tablet 1   fluticasone (FLONASE) 50 MCG/ACT nasal spray Place 2 sprays into both nostrils daily. 16 g 6   fluvoxaMINE (LUVOX) 100 MG tablet TAKE ONE TABLET BY MOUTH TWICE DAILY     Galcanezumab-gnlm (EMGALITY) 120 MG/ML  SOAJ Inject 1 pen  into the skin every 30 (thirty) days. 1.12 mL 11   omeprazole (PRILOSEC) 40 MG capsule Take 40 mg by mouth daily.     Rimegepant Sulfate (NURTEC) 75 MG TBDP Take 1 tablet (75 mg total) by mouth as needed. 8 tablet 11   traZODone (DESYREL) 50 MG tablet Take 50 mg by mouth at bedtime.     No current  facility-administered medications on file prior to visit.   Past Medical History:  Diagnosis Date   Allergy    Anxiety    currently on luvox, hydroxyzine   Asthma    Bipolar affective disorder ([place])    onogoing Dr Nicholaus Corolla treatment for the past 10 years   Chronic fatigue disorder    Chronic migraine without aura    Depression    Fibromyalgia    GERD (gastroesophageal reflux disease)    History of kidney stones    highschool   History of migraine headaches    Insomnia    Interstitial cystitis    Iron deficiency anemia 02/23/2015   Migraines    teenage onset with periods   Panic attacks    Suicide attempt [facility])    Vision changes    Past Surgical History:  Procedure Laterality Date   [place] N/A 02/22/2015   Procedure: CESAREAN SECTION;  Surgeon: Brien Few, MD;  Location: [facility];  Service: Obstetrics;  Laterality: N/A;   CHOLECYSTECTOMY     HEMANGIOMA EXCISION     2009 removal from right ar   KNEE SURGERY     left knee torn cartilage removed 1994   MASS EXCISION Right 11/05/2018   Procedure: EXCISION MASS RIGHT FOREARM;  Surgeon: Charlotte Crumb, MD;  Location: [facility];  Service: Orthopedics;  Laterality: Right;   No Known Allergies  Family History  Problem Relation Age of Onset   Osteoarthritis Mother    Lymphoma Mother        Non-Hodgkin's lymphoma   Hyperlipidemia Mother    Hypertension Mother    Diabetes Mother        type 2   Cancer Mother        breast   Osteoarthritis Father    Other Father        Depression.anxiety   Lung cancer Maternal Grandmother    Diabetes Maternal Grandmother    Alcohol abuse Maternal Grandfather    Prostate cancer Maternal Grandfather    Alcohol abuse Paternal Grandmother    Alcohol abuse Paternal Grandfather    Lung cancer Paternal Grandfather    Diabetes Maternal Aunt    Social History   Socioeconomic History   Marital status: Married    Spouse name: Katie [name]    Number of children: 1   Years of education: Not on file   Highest education level: Some college, no degree  Occupational History   Occupation: Print production planner and nanny  Tobacco Use   Smoking status: Never   Smokeless tobacco: Never  Substance and Sexual Activity   Alcohol use: Yes    Alcohol/week: 4.0 standard drinks of alcohol    Types: 4 Cans of beer per week    Comment: occas   Drug use: Yes    Types: Marijuana    Comment: 08/15/21 rarely   Sexual activity: Yes    Birth control/protection: I.U.D.  Other Topics Concern   Not on file  Social History Narrative   Grew up in [facility]  Married 5 yrs   Occupation - prev occupation.  Preschool teacher and nanny   Never smoked.  No etoh no drugs   Caffeine- soda 3-4 glasses daily   Social Determinants of Health   Financial Resource Strain: Low Risk  (11/20/2021)   Overall Financial Resource Strain (CARDIA)    Difficulty of Paying Living Expenses: Not very hard  Food Insecurity: No Food Insecurity (11/20/2021)   Hunger Vital Sign    Worried About Running Out of Food in the Last Year: Never true    Ran Out of Food in the Last Year: Never true  Transportation Needs: No Transportation Needs (11/20/2021)   PRAPARE - Hydrologist (Medical): No    Lack of Transportation (Non-Medical): No  Physical Activity: Unknown (11/20/2021)   Exercise Vital Sign    Days of Exercise per Week: 0 days    Minutes of Exercise per Session: Not on file  Stress: Stress Concern Present (11/20/2021)   [place]    Feeling of Stress : To some extent  Social Connections: Moderately Isolated (11/20/2021)   Social Connection and Isolation Panel [NHANES]    Frequency of Communication with Friends and Family: More than three times a week    Frequency of Social Gatherings with Friends and Family: Twice a week    Attends Religious Services: Never    Marine scientist  or Organizations: No    Attends Archivist Meetings: Not on file    Marital Status: Married   Vitals:   10/07/22 0851  BP: 120/70  Pulse: 76  Resp: 12  Temp: 98.6 F (37 C)  SpO2: 98%   Body mass index is 32.27 kg/m.  Wt Readings from Last 3 Encounters:  10/07/22 188 lb (85.3 kg)  10/06/22 188 lb 6.4 oz (85.5 kg)  09/17/22 190 lb 8 oz (86.4 kg)   Physical Exam Vitals and nursing note reviewed.  Constitutional:      General: She is not in acute distress.    Appearance: She is well-developed.  HENT:     Head: Normocephalic and atraumatic.     Right Ear: Hearing, tympanic membrane, ear canal and external ear normal.     Left Ear: Hearing, tympanic membrane, ear canal and external ear normal.     Mouth/Throat:     Mouth: Mucous membranes are moist.     Pharynx: Oropharynx is clear. Uvula midline.  Eyes:     Extraocular Movements: Extraocular movements intact.     Conjunctiva/sclera: Conjunctivae normal.     Pupils: Pupils are equal, round, and reactive to light.  Neck:     Thyroid: Thyromegaly present. No thyroid mass.     Trachea: No tracheal deviation.  Cardiovascular:     Rate and Rhythm: Normal rate and regular rhythm.     Pulses:          Dorsalis pedis pulses are 2+ on the right side and 2+ on the left side.     Heart sounds: No murmur heard. Pulmonary:     Effort: Pulmonary effort is normal. No respiratory distress.     Breath sounds: Normal breath sounds.  Abdominal:     Palpations: Abdomen is soft. There is no hepatomegaly or mass.     Tenderness: There is no abdominal tenderness.  Genitourinary:    Comments: Deferred to gyn. Musculoskeletal:     Comments: No major deformity or signs of synovitis appreciated.  Lymphadenopathy:  Cervical: No cervical adenopathy.     Upper Body:     Right upper body: No supraclavicular adenopathy.     Left upper body: No supraclavicular adenopathy.  Skin:    General: Skin is warm.     Findings: No erythema  or rash.  Neurological:     General: No focal deficit present.     Mental Status: She is alert and oriented to person, place, and time.     Cranial Nerves: No cranial nerve deficit.     Coordination: Coordination normal.     Gait: Gait normal.     Deep Tendon Reflexes:     Reflex Scores:      Bicep reflexes are 2+ on the right side and 2+ on the left side.      Patellar reflexes are 2+ on the right side and 2+ on the left side. Psychiatric:        Mood and Affect: Mood and affect normal.   ASSESSMENT AND PLAN: Ms. Katie [name] was here today annual physical examination.  Orders Placed This Encounter  Procedures   Hepatitis C antibody   TSH   Comprehensive metabolic panel   Hemoglobin A1c   Lipid panel   VITAMIN D 25 Hydroxy (Vit-D Deficiency, Fractures)   Lab Results  Component Value Date   CHOL 273 (H) 10/07/2022   HDL 35.40 (L) 10/07/2022   LDLCALC Comment (A) 08/28/2021   LDLDIRECT 174.0 10/07/2022   TRIG 326.0 (H) 10/07/2022   CHOLHDL 8 10/07/2022   Lab Results  Component Value Date   HGBA1C 5.7 10/07/2022   Lab Results  Component Value Date   ALT 15 10/07/2022   AST 14 10/07/2022   ALKPHOS 83 10/07/2022   BILITOT 0.4 10/07/2022   Lab Results  Component Value Date   CREATININE 0.84 10/07/2022   BUN 19 10/07/2022   NA 138 10/07/2022   K 3.9 10/07/2022   CL 107 10/07/2022   CO2 21 10/07/2022   Lab Results  Component Value Date   TSH 0.81 10/07/2022   Routine general medical examination at a health care facility Assessment & Plan: We discussed the importance of regular physical activity and healthy diet for prevention of chronic illness and/or complications. Preventive guidelines reviewed. Vaccination up to date. Continue her female preventive care with her gyn. She is having mammogram today. Next CPE in a year.   Vitamin D deficiency, unspecified Assessment & Plan: She is not on vit D supplementation. Further recommendations according to 25  OH vit D result.  Orders: -     VITAMIN D 25 Hydroxy (Vit-D Deficiency, Fractures); Future  Encounter for HCV screening test for low risk patient -     Hepatitis C antibody; Future  Hyperlipemia, mixed Assessment & Plan: We discussed treatment options. Will wait for FLP results to make pharmacologic treatment recommendations.  Orders: -     Comprehensive metabolic panel; Future -     Lipid panel; Future  Goiter, euthyroid Assessment & Plan: Stable. Last TSH 0.96 in 08/2019.  Orders: -     TSH; Future  Screening for endocrine, metabolic and immunity disorder -     Hemoglobin A1c; Future  Bipolar affective disorder in remission [facility]) Assessment & Plan: Following with psychiatrist every 3 months.   Other orders -     LDL cholesterol, direct  Return in 1 year (on 10/07/2023) for CPE.  Jeter Tomey G. Martinique, MD  [facility]. [place] office.

## 2022-10-07 ENCOUNTER — Ambulatory Visit
Admission: RE | Admit: 2022-10-07 | Discharge: 2022-10-07 | Disposition: A | Discharge status: Home / Self Care | Payer: 59 | Source: Ambulatory Visit | Attending: Family Medicine | Admitting: Family Medicine

## 2022-10-07 ENCOUNTER — Encounter: Payer: Self-pay | Admitting: Family Medicine

## 2022-10-07 ENCOUNTER — Ambulatory Visit (INDEPENDENT_AMBULATORY_CARE_PROVIDER_SITE_OTHER): Payer: 59 | Admitting: Family Medicine

## 2022-10-07 ENCOUNTER — Other Ambulatory Visit: Payer: Self-pay | Admitting: Family Medicine

## 2022-10-07 VITALS — BP 120/70 | HR 76 | Temp 98.6°F | Resp 12 | Ht 64.0 in | Wt 188.0 lb

## 2022-10-07 DIAGNOSIS — Z13228 Encounter for screening for other metabolic disorders: Secondary | ICD-10-CM

## 2022-10-07 DIAGNOSIS — Z1159 Encounter for screening for other viral diseases: Secondary | ICD-10-CM | POA: Diagnosis not present

## 2022-10-07 DIAGNOSIS — Z130 Encounter for screening for diseases of the blood and blood-forming organs and certain disorders involving the immune mechanism: Secondary | ICD-10-CM

## 2022-10-07 DIAGNOSIS — E049 Nontoxic goiter, unspecified: Secondary | ICD-10-CM | POA: Diagnosis not present

## 2022-10-07 DIAGNOSIS — E782 Mixed hyperlipidemia: Secondary | ICD-10-CM | POA: Diagnosis not present

## 2022-10-07 DIAGNOSIS — R2231 Localized swelling, mass and lump, right upper limb: Secondary | ICD-10-CM

## 2022-10-07 DIAGNOSIS — Z1329 Encounter for screening for other suspected endocrine disorder: Secondary | ICD-10-CM

## 2022-10-07 DIAGNOSIS — Z0000 Encounter for general adult medical examination without abnormal findings: Secondary | ICD-10-CM

## 2022-10-07 DIAGNOSIS — E559 Vitamin D deficiency, unspecified: Secondary | ICD-10-CM | POA: Diagnosis not present

## 2022-10-07 DIAGNOSIS — F3170 Bipolar disorder, currently in remission, most recent episode unspecified: Secondary | ICD-10-CM

## 2022-10-07 LAB — COMPREHENSIVE METABOLIC PANEL
ALT: 15 U/L (ref 0–35)
AST: 14 U/L (ref 0–37)
Albumin: 4.4 g/dL (ref 3.5–5.2)
Alkaline Phosphatase: 83 U/L (ref 39–117)
BUN: 19 mg/dL (ref 6–23)
CO2: 21 mEq/L (ref 19–32)
Calcium: 9.3 mg/dL (ref 8.4–10.5)
Chloride: 107 mEq/L (ref 96–112)
Creatinine, Ser: 0.84 mg/dL (ref 0.40–1.20)
GFR: 86.12 mL/min (ref >60.00)
Glucose, Bld: 105 mg/dL — ABNORMAL HIGH (ref 70–99)
Potassium: 3.9 mEq/L (ref 3.5–5.1)
Sodium: 138 mEq/L (ref 135–145)
Total Bilirubin: 0.4 mg/dL (ref 0.2–1.2)
Total Protein: 7.2 g/dL (ref 6.0–8.3)

## 2022-10-07 LAB — LDL CHOLESTEROL, DIRECT: Direct LDL: 174 mg/dL

## 2022-10-07 LAB — LIPID PANEL
Cholesterol: 273 mg/dL — ABNORMAL HIGH (ref 0–200)
HDL: 35.4 mg/dL — ABNORMAL LOW (ref >39.00)
NonHDL: 237.65
Total CHOL/HDL Ratio: 8
Triglycerides: 326 mg/dL — ABNORMAL HIGH (ref 0.0–149.0)
VLDL: 65.2 mg/dL — ABNORMAL HIGH (ref 0.0–40.0)

## 2022-10-07 LAB — VITAMIN D 25 HYDROXY (VIT D DEFICIENCY, FRACTURES): VITD: 14.49 ng/mL — ABNORMAL LOW (ref 30.00–100.00)

## 2022-10-07 LAB — TSH: TSH: 0.81 u[IU]/mL (ref 0.35–5.50)

## 2022-10-07 LAB — HEMOGLOBIN A1C: Hgb A1c MFr Bld: 5.7 % (ref 4.6–6.5)

## 2022-10-07 NOTE — Assessment & Plan Note (Signed)
We discussed the importance of regular physical activity and healthy diet for prevention of chronic illness and/or complications. Preventive guidelines reviewed. Vaccination up to date. Continue her female preventive care with her gyn. She is having mammogram today. Next CPE in a year.

## 2022-10-07 NOTE — Assessment & Plan Note (Signed)
She is not on vit D supplementation. Further recommendations according to 25 OH vit D result. 

## 2022-10-07 NOTE — Patient Instructions (Addendum)
A few things to remember from today's visit:  Routine general medical examination at a health care facility  Vitamin D deficiency, unspecified - Plan: VITAMIN D 25 Hydroxy (Vit-D Deficiency, Fractures)  Encounter for HCV screening test for low risk patient - Plan: Hepatitis C antibody  Hyperlipemia, mixed - Plan: Comprehensive metabolic panel, Lipid panel  Goiter, euthyroid - Plan: TSH  Screening for endocrine, metabolic and immunity disorder - Plan: Hemoglobin A1c  If you need refills for medications you take chronically, please call your pharmacy. Do not use My Chart to request refills or for acute issues that need immediate attention. If you send a my chart message, it may take a few days to be addressed, specially if I am not in the office.  Please be sure medication list is accurate. If a new problem present, please set up appointment sooner than planned today.  Health Maintenance, Female Adopting a healthy lifestyle and getting preventive care are important in promoting health and wellness. Ask your health care provider about: The right schedule for you to have regular tests and exams. Things you can do on your own to prevent diseases and keep yourself healthy. What should I know about diet, weight, and exercise? Eat a healthy diet  Eat a diet that includes plenty of vegetables, fruits, low-fat dairy products, and lean protein. Do not eat a lot of foods that are high in solid fats, added sugars, or sodium. Maintain a healthy weight Body mass index (BMI) is used to identify weight problems. It estimates body fat based on height and weight. Your health care provider can help determine your BMI and help you achieve or maintain a healthy weight. Get regular exercise Get regular exercise. This is one of the most important things you can do for your health. Most adults should: Exercise for at least 150 minutes each week. The exercise should increase your heart rate and make you  sweat (moderate-intensity exercise). Do strengthening exercises at least twice a week. This is in addition to the moderate-intensity exercise. Spend less time sitting. Even light physical activity can be beneficial. Watch cholesterol and blood lipids Have your blood tested for lipids and cholesterol at 42 years of age, then have this test every 5 years. Have your cholesterol levels checked more often if: Your lipid or cholesterol levels are high. You are older than 42 years of age. You are at high risk for heart disease. What should I know about cancer screening? Depending on your health history and family history, you may need to have cancer screening at various ages. This may include screening for: Breast cancer. Cervical cancer. Colorectal cancer. Skin cancer. Lung cancer. What should I know about heart disease, diabetes, and high blood pressure? Blood pressure and heart disease High blood pressure causes heart disease and increases the risk of stroke. This is more likely to develop in people who have high blood pressure readings or are overweight. Have your blood pressure checked: Every 3-5 years if you are 23-49 years of age. Every year if you are 17 years old or older. Diabetes Have regular diabetes screenings. This checks your fasting blood sugar level. Have the screening done: Once every three years after age 33 if you are at a normal weight and have a low risk for diabetes. More often and at a younger age if you are overweight or have a high risk for diabetes. What should I know about preventing infection? Hepatitis B If you have a higher risk for hepatitis B, you  should be screened for this virus. Talk with your health care provider to find out if you are at risk for hepatitis B infection. Hepatitis C Testing is recommended for: Everyone born from [birth year] through 1965. Anyone with known risk factors for hepatitis C. Sexually transmitted infections (STIs) Get screened for  STIs, including gonorrhea and chlamydia, if: You are sexually active and are younger than 42 years of age. You are older than 42 years of age and your health care provider tells you that you are at risk for this type of infection. Your sexual activity has changed since you were last screened, and you are at increased risk for chlamydia or gonorrhea. Ask your health care provider if you are at risk. Ask your health care provider about whether you are at high risk for HIV. Your health care provider may recommend a prescription medicine to help prevent HIV infection. If you choose to take medicine to prevent HIV, you should first get tested for HIV. You should then be tested every 3 months for as long as you are taking the medicine. Pregnancy If you are about to stop having your period (premenopausal) and you may become pregnant, seek counseling before you get pregnant. Take 400 to 800 micrograms (mcg) of folic acid every day if you become pregnant. Ask for birth control (contraception) if you want to prevent pregnancy. Osteoporosis and menopause Osteoporosis is a disease in which the bones lose minerals and strength with aging. This can result in bone fractures. If you are 89 years old or older, or if you are at risk for osteoporosis and fractures, ask your health care provider if you should: Be screened for bone loss. Take a calcium or vitamin D supplement to lower your risk of fractures. Be given hormone replacement therapy (HRT) to treat symptoms of menopause. Follow these instructions at home: Alcohol use Do not drink alcohol if: Your health care provider tells you not to drink. You are pregnant, may be pregnant, or are planning to become pregnant. If you drink alcohol: Limit how much you have to: 0-1 drink a day. Know how much alcohol is in your drink. In the [place] one drink equals one 12 oz bottle of beer (355 mL), one 5 oz glass of wine (148 mL), or one 1 oz glass of hard liquor (44  mL). Lifestyle Do not use any products that contain nicotine or tobacco. These products include cigarettes, chewing tobacco, and vaping devices, such as e-cigarettes. If you need help quitting, ask your health care provider. Do not use street drugs. Do not share needles. Ask your health care provider for help if you need support or information about quitting drugs. General instructions Schedule regular health, dental, and eye exams. Stay current with your vaccines. Tell your health care provider if: You often feel depressed. You have ever been abused or do not feel safe at home. Summary Adopting a healthy lifestyle and getting preventive care are important in promoting health and wellness. Follow your health care provider's instructions about healthy diet, exercising, and getting tested or screened for diseases. Follow your health care provider's instructions on monitoring your cholesterol and blood pressure. This information is not intended to replace advice given to you by your health care provider. Make sure you discuss any questions you have with your health care provider. Document Revised: 11/19/2020 Document Reviewed: 11/19/2020 Elsevier Patient Education  [place].

## 2022-10-07 NOTE — Assessment & Plan Note (Signed)
We discussed treatment options. Will wait for FLP results to make pharmacologic treatment recommendations.

## 2022-10-07 NOTE — Assessment & Plan Note (Signed)
Following with psychiatrist every 3 months.

## 2022-10-08 LAB — HEPATITIS C ANTIBODY: Hepatitis C Ab: NONREACTIVE

## 2022-10-10 MED ORDER — VITAMIN D (ERGOCALCIFEROL) 1.25 MG (50000 UNIT) PO CAPS: 50000.0000 [IU] | ORAL_CAPSULE | ORAL | 0 refills | Status: AC

## 2022-10-10 MED ORDER — ROSUVASTATIN CALCIUM 20 MG PO TABS: 20.0000 mg | ORAL_TABLET | Freq: Every day | ORAL | 3 refills | Status: DC

## 2022-10-10 NOTE — Assessment & Plan Note (Signed)
Stable. Last TSH 0.96 in 08/2019.

## 2022-10-20 ENCOUNTER — Encounter: Payer: Self-pay | Admitting: Adult Health

## 2022-10-20 ENCOUNTER — Telehealth: Payer: Self-pay

## 2022-10-20 NOTE — Telephone Encounter (Signed)
Patient has been approve for EMGALITY 120mg /ML from [employer] till 10/16/23. PA# [id number]. 10/15/22 - 10/16/23.

## 2023-03-23 ENCOUNTER — Telehealth: Payer: Self-pay

## 2023-03-23 NOTE — Telephone Encounter (Signed)
Previous Therapy: Imitrex Zomig nasal spray Excedrin Amitriptyline - mood changes Topamax - nausea Zonisamide 100 mg daily - nausea Lamictal - dysuria Cannot take NSAIDs due to chronic lithium therapy

## 2023-03-23 NOTE — Telephone Encounter (Signed)
*  [facility]  Pharmacy Patient Advocate Encounter   Received notification from CoverMyMeds that prior authorization for Nurtec 75MG  dispersible tablets  is required/requested.   Insurance verification completed.   The patient is insured through  [employer]  .   Per test claim: PA required; PA started via CoverMyMeds. KEY [id number] . Waiting for clinical questions to populate.

## 2023-03-23 NOTE — Progress Notes (Unsigned)
ACUTE VISIT No chief complaint on file.  HPI: [name] is a 42 y.o. female, who is here today complaining of *** HPI  Review of Systems See other pertinent positives and negatives in HPI.  Current Outpatient Medications on File Prior to Visit  Medication Sig Dispense Refill  . cariprazine (VRAYLAR) 1.5 MG capsule Take by mouth.    . cyclobenzaprine (FLEXERIL) 5 MG tablet TAKE 1 TABLET BY MOUTH EVERY 8 HOURS AS NEEDED FOR MUSCLE SPASMS. 30 tablet 1  . fluticasone (FLONASE) 50 MCG/ACT nasal spray Place 2 sprays into both nostrils daily. 16 g 6  . fluvoxaMINE (LUVOX) 100 MG tablet TAKE ONE TABLET BY MOUTH TWICE DAILY    . Galcanezumab-gnlm (EMGALITY) 120 MG/ML SOAJ Inject 1 pen  into the skin every 30 (thirty) days. 1.12 mL 11  . omeprazole (PRILOSEC) 40 MG capsule Take 40 mg by mouth daily.    . Rimegepant Sulfate (NURTEC) 75 MG TBDP Take 1 tablet (75 mg total) by mouth as needed. 8 tablet 11  . rosuvastatin (CRESTOR) 20 MG tablet Take 1 tablet (20 mg total) by mouth daily. 90 tablet 3  . traZODone (DESYREL) 50 MG tablet Take 50 mg by mouth at bedtime.     No current facility-administered medications on file prior to visit.    Past Medical History:  Diagnosis Date  . Allergy   . Anxiety    currently on luvox, hydroxyzine  . Asthma   . Bipolar affective disorder [facility])    onogoing Dr Maye Hides treatment for the past 10 years  . Chronic fatigue disorder   . Chronic migraine without aura   . Depression   . Fibromyalgia   . GERD (gastroesophageal reflux disease)   . History of kidney stones    highschool  . History of migraine headaches   . Insomnia   . Interstitial cystitis   . Iron deficiency anemia 02/23/2015  . Migraines    teenage onset with periods  . Panic attacks   . Suicide attempt (HCC)   . Vision changes    No Known Allergies  Social History   Socioeconomic History  . Marital status: Married    Spouse name: Jill Alexanders  . Number of children: 1  .  Years of education: Not on file  . Highest education level: Some college, no degree  Occupational History  . Occupation: Manufacturing systems engineer and nanny  Tobacco Use  . Smoking status: Never  . Smokeless tobacco: Never  Substance and Sexual Activity  . Alcohol use: Yes    Alcohol/week: 4.0 standard drinks of alcohol    Types: 4 Cans of beer per week    Comment: occas  . Drug use: Yes    Types: Marijuana    Comment: 08/15/21 rarely  . Sexual activity: Yes    Birth control/protection: I.U.D.  Other Topics Concern  . Not on file  Social History Narrative   Grew up in [place]   Married 5 yrs   Occupation - prev occupation.  Preschool teacher and nanny   Never smoked.  No etoh no drugs   Caffeine- soda 3-4 glasses daily   Social Determinants of Health   Financial Resource Strain: Low Risk  (03/23/2023)   Overall Financial Resource Strain (CARDIA)   . Difficulty of Paying Living Expenses: Not very hard  Food Insecurity: No Food Insecurity (03/23/2023)   Hunger Vital Sign   . Worried About Programme researcher, broadcasting/film/video in the Last Year: Never true   .  Ran Out of Food in the Last Year: Never true  Transportation Needs: No Transportation Needs (03/23/2023)   PRAPARE - Transportation   . Lack of Transportation (Medical): No   . Lack of Transportation (Non-Medical): No  Physical Activity: Insufficiently Active (03/23/2023)   Exercise Vital Sign   . Days of Exercise per Week: 2 days   . Minutes of Exercise per Session: 30 min  Stress: Stress Concern Present (03/23/2023)   [employer] of Occupational Health - Occupational Stress Questionnaire   . Feeling of Stress : To some extent  Social Connections: Moderately Isolated (03/23/2023)   Social Connection and Isolation Panel [NHANES]   . Frequency of Communication with Friends and Family: More than three times a week   . Frequency of Social Gatherings with Friends and Family: Twice a week   . Attends Religious Services: Never   . Active Member of Clubs  or Organizations: No   . Attends Banker Meetings: Not on file   . Marital Status: Married    There were no vitals filed for this visit. There is no height or weight on file to calculate BMI.  Physical Exam  ASSESSMENT AND PLAN: There are no diagnoses linked to this encounter.  No follow-ups on file.  Praneel Haisley G. [place], MD  [facility]. [place] office.  Discharge Instructions   None

## 2023-03-24 ENCOUNTER — Ambulatory Visit: Payer: 59 | Admitting: Family Medicine

## 2023-03-24 ENCOUNTER — Encounter: Payer: Self-pay | Admitting: Family Medicine

## 2023-03-24 ENCOUNTER — Ambulatory Visit (INDEPENDENT_AMBULATORY_CARE_PROVIDER_SITE_OTHER): Payer: 59

## 2023-03-24 VITALS — BP 118/70 | HR 85 | Temp 98.6°F | Resp 12 | Ht 64.0 in | Wt 189.2 lb

## 2023-03-24 DIAGNOSIS — M545 Low back pain, unspecified: Secondary | ICD-10-CM | POA: Diagnosis not present

## 2023-03-24 DIAGNOSIS — R109 Unspecified abdominal pain: Secondary | ICD-10-CM

## 2023-03-24 DIAGNOSIS — N200 Calculus of kidney: Secondary | ICD-10-CM

## 2023-03-24 LAB — URINALYSIS, MICROSCOPIC ONLY: RBC / HPF: NONE SEEN (ref 0–?)

## 2023-03-24 LAB — POC URINALSYSI DIPSTICK (AUTOMATED)
Bilirubin, UA: NEGATIVE
Blood, UA: POSITIVE
Glucose, UA: NEGATIVE
Ketones, UA: NEGATIVE
Nitrite, UA: NEGATIVE
Protein, UA: NEGATIVE
Spec Grav, UA: 1.015 (ref 1.010–1.025)
Urobilinogen, UA: 0.2 U/dL
pH, UA: 5.5 (ref 5.0–8.0)

## 2023-03-24 NOTE — Patient Instructions (Signed)
A few things to remember from today's visit:  Flank pain - Plan: POCT Urinalysis Dipstick (Automated), DG Abd 1 View, Urine Microscopic Only  Acute left-sided low back pain without sciatica - Plan: DG Abd 1 View, Urine Microscopic Only  Nephrolithiasis  If pain is still present in 5 days please let us know. Increase fluid intake. Strain urine. Could also be muscular pain, topical icy hot may help.  If you need refills for medications you take chronically, please call your pharmacy. Do not use My Chart to request refills or for acute issues that need immediate attention. If you send a my chart message, it may take a few days to be addressed, specially if I am not in the office.  Please be sure medication list is accurate. If a new problem present, please set up appointment sooner than planned today.

## 2023-03-30 ENCOUNTER — Other Ambulatory Visit (HOSPITAL_COMMUNITY): Payer: Self-pay

## 2023-03-30 NOTE — Telephone Encounter (Signed)
Pharmacy Patient Advocate Encounter  Received notification from [employer] that Prior Authorization for Nurtec 75MG  dispersible tablets has been APPROVED from 03/24/2023 to 03/23/2024   PA #/Case ID/Reference #: PA Case ID #: [id number]

## 2023-05-19 ENCOUNTER — Other Ambulatory Visit (HOSPITAL_COMMUNITY): Payer: Self-pay

## 2023-05-19 ENCOUNTER — Telehealth: Payer: Self-pay

## 2023-05-19 NOTE — Telephone Encounter (Signed)
Pharmacy Patient Advocate Encounter   Received notification from CoverMyMeds that prior authorization for Nurtec 75MG  dispersible tablets is required/requested.   Insurance verification completed.   The patient is insured through [employer] .   Per test claim: PA required; PA submitted to above mentioned insurance via CoverMyMeds Key/confirmation #/EOC [id number] Status is pending

## 2023-05-20 ENCOUNTER — Other Ambulatory Visit (HOSPITAL_COMMUNITY): Payer: Self-pay

## 2023-05-20 NOTE — Telephone Encounter (Signed)
Prior approval already on file with [employer].

## 2023-09-24 ENCOUNTER — Other Ambulatory Visit (HOSPITAL_COMMUNITY): Payer: Self-pay

## 2023-09-24 ENCOUNTER — Telehealth: Payer: Self-pay

## 2023-09-24 NOTE — Telephone Encounter (Signed)
 Pharmacy Patient Advocate Encounter  Received notification from [employer] that Prior Authorization for Emgality 120MG /ML auto-injectors (migraine) has been APPROVED from 09-24-2023 to 09-23-2024   PA #/Case ID/Reference #: [id number]

## 2023-09-24 NOTE — Telephone Encounter (Signed)
 Pharmacy Patient Advocate Encounter   Received notification from CoverMyMeds that prior authorization for Emgality 120MG /ML auto-injectors (migraine) is required/requested.   Insurance verification completed.   The patient is insured through [employer] .   Per test claim: PA required; PA submitted to above mentioned insurance via CoverMyMeds Key/confirmation #/EOC [id number] Status is pending

## 2023-10-05 NOTE — Progress Notes (Unsigned)
 CC:  headaches No chief complaint on file.    Follow-up Visit  Last visit: 03/05/2022 with Dr. Delena Bali  Brief HPI: 43 year old female with a history of nephrolithiasis, migraines, bipolar disorder, fibromyalgia, and GERD who follows in clinic for vision changes and migraines.   At her last visit, continued Emgality monthly injection for prevention and Nurtec and Flexeril as needed for rescue.   Interval History:  Continues on Emgality with great control of migraine headaches.  Currently experiencing about 4 migraines per month, will use Nurtec with resolution of migraine.  She has since completely stopped zonisamide and denies any worsening.  Does have prescription for Flexeril as needed but has not needed recently.     Headache days per month: 4 Headache free days per month: 26  Current Headache Regimen: Preventative: Emgality 120 mg monthly Abortive: Nurtec 75 mg PRN, flexeril 5 mg TID PRN   Prior Therapies                                  Imitrex Zomig nasal spray Ubrelvy - helped but caused side effects Excedrin Amitriptyline - mood changes Topamax - nausea Zonisamide 100 mg daily - nausea, kidney stones Lamictal - dysuria Emgality Cannot take NSAIDs due to chronic lithium therapy   Current Outpatient Medications on File Prior to Visit  Medication Sig Dispense Refill   AUROVELA FE 1/20 1-20 MG-MCG tablet Take 1 tablet by mouth daily.     cariprazine (VRAYLAR) 1.5 MG capsule Take by mouth.     cyclobenzaprine (FLEXERIL) 5 MG tablet TAKE 1 TABLET BY MOUTH EVERY 8 HOURS AS NEEDED FOR MUSCLE SPASMS. 30 tablet 1   fluticasone (FLONASE) 50 MCG/ACT nasal spray Place 2 sprays into both nostrils daily. 16 g 6   fluvoxaMINE (LUVOX) 100 MG tablet TAKE ONE TABLET BY MOUTH TWICE DAILY     Galcanezumab-gnlm (EMGALITY) 120 MG/ML SOAJ Inject 1 pen  into the skin every 30 (thirty) days. 1.12 mL 11   omeprazole (PRILOSEC) 40 MG capsule Take 40 mg by mouth daily.     Rimegepant  Sulfate (NURTEC) 75 MG TBDP Take 1 tablet (75 mg total) by mouth as needed. 8 tablet 11   rosuvastatin (CRESTOR) 20 MG tablet Take 1 tablet (20 mg total) by mouth daily. 90 tablet 3   traZODone (DESYREL) 50 MG tablet Take 50 mg by mouth at bedtime.     No current facility-administered medications on file prior to visit.   Past Medical History:  Diagnosis Date   Allergy    Anxiety    currently on luvox, hydroxyzine   Asthma    Bipolar affective disorder (HCC)    onogoing Dr Maye Hides treatment for the past 10 years   Chronic fatigue disorder    Chronic migraine without aura    Depression    Fibromyalgia    GERD (gastroesophageal reflux disease)    History of kidney stones    highschool   History of migraine headaches    Insomnia    Interstitial cystitis    Iron deficiency anemia 02/23/2015   Migraines    teenage onset with periods   Panic attacks    Suicide attempt [facility])    Vision changes    Past Surgical History:  Procedure Laterality Date   ADENOIDECTOMY     48   CESAREAN SECTION N/A 02/22/2015   Procedure: CESAREAN SECTION;  Surgeon: Olivia Mackie, MD;  Location:  [facility];  Service: Obstetrics;  Laterality: N/A;   CHOLECYSTECTOMY     HEMANGIOMA EXCISION     2009 removal from right ar   KNEE SURGERY     left knee torn cartilage removed 1994   MASS EXCISION Right 11/05/2018   Procedure: EXCISION MASS RIGHT FOREARM;  Surgeon: Dairl Ponder, MD;  Location: [facility];  Service: Orthopedics;  Laterality: Right;      Physical Exam:   Vital Signs: There were no vitals taken for this visit. GENERAL:  well appearing, in no acute distress, alert  SKIN:  Color, texture, turgor normal. No rashes or lesions HEAD:  Normocephalic/atraumatic. RESP: normal respiratory effort MSK:  No gross joint deformities.   NEUROLOGICAL: Mental Status: Alert, oriented to person, place and time, Follows commands, and Speech fluent and appropriate. Cranial Nerves: PERRL,  face symmetric, no dysarthria, hearing grossly intact Motor: moves all extremities equally Gait: normal-based.       IMPRESSION: 43 year old female with a history of nephrolithiasis, migraines, bipolar disorder, fibromyalgia, and GERD who presents for follow up of migraines and vision changes. Her symptoms have improved dramatically with Emgality and she is very happy with her current headache control.  Use of Nurtec with benefit.   PLAN: -Prevention: Continue Emgality 120 mg monthly -Rescue: Continue Nurtec 75 mg PRN. Continue Flexeril 5 mg TID PRN    Follow-up in 1 year or call earlier if needed       Ihor Austin, [facility]  [facility] [address] [place], [place] [address]  Phone [phone] Fax [phone] Note: This document was prepared with digital dictation and possible smart phrase technology. Any transcriptional errors that result from this process are unintentional.

## 2023-10-06 ENCOUNTER — Encounter: Payer: Self-pay | Admitting: Adult Health

## 2023-10-06 ENCOUNTER — Ambulatory Visit (INDEPENDENT_AMBULATORY_CARE_PROVIDER_SITE_OTHER): Payer: 59 | Admitting: Adult Health

## 2023-10-06 VITALS — BP 120/71 | HR 81 | Ht 64.0 in | Wt 192.0 lb

## 2023-10-06 DIAGNOSIS — G43E09 Chronic migraine with aura, not intractable, without status migrainosus: Secondary | ICD-10-CM | POA: Diagnosis not present

## 2023-10-06 MED ORDER — EMGALITY 120 MG/ML ~~LOC~~ SOAJ: 1.0000 "pen " | SUBCUTANEOUS | 11 refills | Status: AC

## 2023-10-06 MED ORDER — NURTEC 75 MG PO TBDP: 75.0000 mg | ORAL_TABLET | ORAL | 11 refills | Status: AC | PRN

## 2023-10-08 ENCOUNTER — Other Ambulatory Visit: Payer: Self-pay | Admitting: Family Medicine

## 2023-10-08 DIAGNOSIS — E782 Mixed hyperlipidemia: Secondary | ICD-10-CM

## 2023-10-26 ENCOUNTER — Telehealth: Payer: Self-pay

## 2023-10-26 ENCOUNTER — Other Ambulatory Visit (HOSPITAL_COMMUNITY): Payer: Self-pay

## 2023-10-26 NOTE — Telephone Encounter (Signed)
 Pharmacy Patient Advocate Encounter   Received notification from Fax that prior authorization for Emgality is required/requested.   Insurance verification completed.   The patient is insured through [employer] .   Per test claim: The current 30 day co-pay is, $35.00.  No PA needed at this time. This test claim was processed through [facility]- copay amounts may vary at other pharmacies due to pharmacy/plan contracts, or as the patient moves through the different stages of their insurance plan.

## 2023-12-22 ENCOUNTER — Encounter: Payer: Self-pay | Admitting: Family Medicine

## 2023-12-22 ENCOUNTER — Ambulatory Visit (INDEPENDENT_AMBULATORY_CARE_PROVIDER_SITE_OTHER): Admitting: Family Medicine

## 2023-12-22 VITALS — BP 100/70 | HR 94 | Temp 98.6°F | Resp 12 | Ht 64.0 in | Wt 199.0 lb

## 2023-12-22 DIAGNOSIS — Z130 Encounter for screening for diseases of the blood and blood-forming organs and certain disorders involving the immune mechanism: Secondary | ICD-10-CM

## 2023-12-22 DIAGNOSIS — Z1329 Encounter for screening for other suspected endocrine disorder: Secondary | ICD-10-CM

## 2023-12-22 DIAGNOSIS — Z13228 Encounter for screening for other metabolic disorders: Secondary | ICD-10-CM

## 2023-12-22 DIAGNOSIS — E782 Mixed hyperlipidemia: Secondary | ICD-10-CM

## 2023-12-22 DIAGNOSIS — E559 Vitamin D deficiency, unspecified: Secondary | ICD-10-CM | POA: Diagnosis not present

## 2023-12-22 DIAGNOSIS — Z0000 Encounter for general adult medical examination without abnormal findings: Secondary | ICD-10-CM

## 2023-12-22 NOTE — Patient Instructions (Addendum)
 A few things to remember from today's visit:  Routine general medical examination at a health care facility  Hyperlipemia, mixed - Plan: Comprehensive metabolic panel with GFR, Lipid panel  Vitamin D  deficiency, unspecified - Plan: VITAMIN D  25 Hydroxy (Vit-D Deficiency, Fractures)  Screening for endocrine, metabolic and immunity disorder - Plan: Hemoglobin A1c  If you need refills for medications you take chronically, please call your pharmacy. Do not use My Chart to request refills or for acute issues that need immediate attention. If you send a my chart message, it may take a few days to be addressed, specially if I am not in the office.  Please be sure medication list is accurate. If a new problem present, please set up appointment sooner than planned today.  Health Maintenance, Female Adopting a healthy lifestyle and getting preventive care are important in promoting health and wellness. Ask your health care provider about: The right schedule for you to have regular tests and exams. Things you can do on your own to prevent diseases and keep yourself healthy. What should I know about diet, weight, and exercise? Eat a healthy diet  Eat a diet that includes plenty of vegetables, fruits, low-fat dairy products, and lean protein. Do not eat a lot of foods that are high in solid fats, added sugars, or sodium. Maintain a healthy weight Body mass index (BMI) is used to identify weight problems. It estimates body fat based on height and weight. Your health care provider can help determine your BMI and help you achieve or maintain a healthy weight. Get regular exercise Get regular exercise. This is one of the most important things you can do for your health. Most adults should: Exercise for at least 150 minutes each week. The exercise should increase your heart rate and make you sweat (moderate-intensity exercise). Do strengthening exercises at least twice a week. This is in addition to the  moderate-intensity exercise. Spend less time sitting. Even light physical activity can be beneficial. Watch cholesterol and blood lipids Have your blood tested for lipids and cholesterol at 43 years of age, then have this test every 5 years. Have your cholesterol levels checked more often if: Your lipid or cholesterol levels are high. You are older than 43 years of age. You are at high risk for heart disease. What should I know about cancer screening? Depending on your health history and family history, you may need to have cancer screening at various ages. This may include screening for: Breast cancer. Cervical cancer. Colorectal cancer. Skin cancer. Lung cancer. What should I know about heart disease, diabetes, and high blood pressure? Blood pressure and heart disease High blood pressure causes heart disease and increases the risk of stroke. This is more likely to develop in people who have high blood pressure readings or are overweight. Have your blood pressure checked: Every 3-5 years if you are 19-63 years of age. Every year if you are 26 years old or older. Diabetes Have regular diabetes screenings. This checks your fasting blood sugar level. Have the screening done: Once every three years after age 22 if you are at a normal weight and have a low risk for diabetes. More often and at a younger age if you are overweight or have a high risk for diabetes. What should I know about preventing infection? Hepatitis B If you have a higher risk for hepatitis B, you should be screened for this virus. Talk with your health care provider to find out if you are at  risk for hepatitis B infection. Hepatitis C Testing is recommended for: Everyone born from [birth year] through 1965. Anyone with known risk factors for hepatitis C. Sexually transmitted infections (STIs) Get screened for STIs, including gonorrhea and chlamydia, if: You are sexually active and are younger than 43 years of age. You are  older than 43 years of age and your health care provider tells you that you are at risk for this type of infection. Your sexual activity has changed since you were last screened, and you are at increased risk for chlamydia or gonorrhea. Ask your health care provider if you are at risk. Ask your health care provider about whether you are at high risk for HIV. Your health care provider may recommend a prescription medicine to help prevent HIV infection. If you choose to take medicine to prevent HIV, you should first get tested for HIV. You should then be tested every 3 months for as long as you are taking the medicine. Pregnancy If you are about to stop having your period (premenopausal) and you may become pregnant, seek counseling before you get pregnant. Take 400 to 800 micrograms (mcg) of folic acid every day if you become pregnant. Ask for birth control (contraception) if you want to prevent pregnancy. Osteoporosis and menopause Osteoporosis is a disease in which the bones lose minerals and strength with aging. This can result in bone fractures. If you are 66 years old or older, or if you are at risk for osteoporosis and fractures, ask your health care provider if you should: Be screened for bone loss. Take a calcium  or vitamin D  supplement to lower your risk of fractures. Be given hormone replacement therapy (HRT) to treat symptoms of menopause. Follow these instructions at home: Alcohol use Do not drink alcohol if: Your health care provider tells you not to drink. You are pregnant, may be pregnant, or are planning to become pregnant. If you drink alcohol: Limit how much you have to: 0-1 drink a day. Know how much alcohol is in your drink. In the [place] one drink equals one 12 oz bottle of beer (355 mL), one 5 oz glass of wine (148 mL), or one 1 oz glass of hard liquor (44 mL). Lifestyle Do not use any products that contain nicotine or tobacco. These products include cigarettes, chewing  tobacco, and vaping devices, such as e-cigarettes. If you need help quitting, ask your health care provider. Do not use street drugs. Do not share needles. Ask your health care provider for help if you need support or information about quitting drugs. General instructions Schedule regular health, dental, and eye exams. Stay current with your vaccines. Tell your health care provider if: You often feel depressed. You have ever been abused or do not feel safe at home. Summary Adopting a healthy lifestyle and getting preventive care are important in promoting health and wellness. Follow your health care provider's instructions about healthy diet, exercising, and getting tested or screened for diseases. Follow your health care provider's instructions on monitoring your cholesterol and blood pressure. This information is not intended to replace advice given to you by your health care provider. Make sure you discuss any questions you have with your health care provider. Document Revised: 11/19/2020 Document Reviewed: 11/19/2020 Elsevier Patient Education  2024 [employer].

## 2023-12-22 NOTE — Progress Notes (Signed)
 HPI: Katie [name] is a 43 y.o. female with a PMHx significant for GERD, chronic cholecystitis, fibromyalgia, OCD, vitamin D  deficiency, bipolar disorder, and HLD, among some, who is here today for her routine general medical examination.  Last seen on 03/24/2023  Exercise: Patient states she is no longer exercising.  Diet: She has been eating at home mostly. She mentions she had lost some weight but has since gained most of it back.  Sleep: 8-9 hours per night.  Alcohol Use: She has a cocktail or two every couple of weeks.  Smoking: never Vision: UTD on routine vision care.  Dental: UTD on routine dental care.   Health Maintenance  Topic Date Due   COVID-19 Vaccine (3 - 2024-25 season) 01/07/2024*   Flu Shot  02/12/2024   Pap with HPV screening  02/11/2025   DTaP/Tdap/Td vaccine (4 - Td or Tdap) 06/27/2028   Hepatitis C Screening  Completed   HIV Screening  Completed   HPV Vaccine  Aged Out   Meningitis B Vaccine  Aged Out  *Topic was postponed. The date shown is not the original due date.   Immunization History  Administered Date(s) Administered   DTaP 07/15/2006   Influenza,inj,Quad PF,6+ Mos 03/21/2013, 03/27/2018   Influenza-Unspecified 05/13/2015, 03/09/2022   PFIZER(Purple Top)SARS-COV-2 Vaccination 09/29/2019, 10/24/2019   Tdap 07/15/2006, 06/27/2018   Chronic Medical Problems:  Hyperlipidemia: Currently on rosuvastatin  20 mg daily. Not taking fish oil   FMHx of HLD in both parents. No FMHx of early heart attacks.  Side effects from medication: none Lab Results  Component Value Date   CHOL 273 (H) 10/07/2022   HDL 35.40 (L) 10/07/2022   LDLCALC Comment (A) 08/28/2021   LDLDIRECT 174.0 10/07/2022   TRIG 326.0 (H) 10/07/2022   CHOLHDL 8 10/07/2022   Lab Results  Component Value Date   TSH 0.81 10/07/2022   Migraines: On Nurtec 75 mg daily as needed. She follows with neurology.   Bipolar disorder/OCD:  Currently on Vraylar 1.5 mg daily and Luvox  100  mg twice daily.  She sees her psychiatrist every 3 months.   GERD: On Omeprazole  40 mg daily.   Vitamin D  deficiency:  Not taking vitamin D  supplementation.  Lab Results  Component Value Date   VD25OH 14.49 (L) 10/07/2022   Review of Systems  Constitutional:  Negative for activity change, appetite change and fever.  HENT:  Negative for mouth sores, sore throat and trouble swallowing.   Eyes:  Negative for redness and visual disturbance.  Respiratory:  Negative for cough, shortness of breath and wheezing.   Cardiovascular:  Negative for chest pain and leg swelling.  Gastrointestinal:  Negative for abdominal pain, nausea and vomiting.  Endocrine: Negative for cold intolerance, heat intolerance, polydipsia, polyphagia and polyuria.  Genitourinary:  Negative for decreased urine volume, dysuria and hematuria.  Musculoskeletal:  Negative for gait problem and myalgias.  Skin:  Negative for color change and rash.  Allergic/Immunologic: Positive for environmental allergies.  Neurological:  Negative for syncope, weakness and headaches.  Hematological:  Negative for adenopathy. Does not bruise/bleed easily.  Psychiatric/Behavioral:  Negative for confusion and hallucinations.   All other systems reviewed and are negative.  Current Outpatient Medications on File Prior to Visit  Medication Sig Dispense Refill   AUROVELA FE 1/20 1-20 MG-MCG tablet Take 1 tablet by mouth daily.     cariprazine (VRAYLAR) 1.5 MG capsule Take by mouth.     cyclobenzaprine  (FLEXERIL ) 5 MG tablet TAKE 1 TABLET BY MOUTH EVERY 8  HOURS AS NEEDED FOR MUSCLE SPASMS. 30 tablet 1   fluticasone  (FLONASE ) 50 MCG/ACT nasal spray Place 2 sprays into both nostrils daily. 16 g 6   fluvoxaMINE  (LUVOX ) 100 MG tablet TAKE ONE TABLET BY MOUTH TWICE DAILY     Galcanezumab -gnlm (EMGALITY ) 120 MG/ML SOAJ Inject 1 pen  into the skin every 30 (thirty) days. 1.12 mL 11   omeprazole  (PRILOSEC) 40 MG capsule Take 40 mg by mouth daily.      Rimegepant Sulfate (NURTEC) 75 MG TBDP Take 1 tablet (75 mg total) by mouth as needed. 8 tablet 11   rosuvastatin  (CRESTOR ) 20 MG tablet Take 1 tablet (20 mg total) by mouth daily. Due for follow up/physical. 90 tablet 0   traZODone  (DESYREL ) 50 MG tablet Take 50 mg by mouth at bedtime.     No current facility-administered medications on file prior to visit.   Past Medical History:  Diagnosis Date   Allergy    Anxiety    currently on luvox , hydroxyzine   Asthma    Bipolar affective disorder (HCC)    onogoing Dr Adams Holmes treatment for the past 10 years   Chronic fatigue disorder    Chronic migraine without aura    Depression    Fibromyalgia    GERD (gastroesophageal reflux disease)    History of kidney stones    highschool   History of migraine headaches    Insomnia    Interstitial cystitis    Iron deficiency anemia 02/23/2015   Migraines    teenage onset with periods   Panic attacks    Suicide attempt (HCC)    Vision changes    No Known Allergies  Social History   Socioeconomic History   Marital status: Married    Spouse name: Austine Lefort   Number of children: 1   Years of education: Not on file   Highest education level: Some college, no degree  Occupational History   Occupation: Manufacturing systems engineer and nanny  Tobacco Use   Smoking status: Never   Smokeless tobacco: Never  Vaping Use   Vaping status: Not on file  Substance and Sexual Activity   Alcohol use: Not Currently    Comment: occas   Drug use: Yes    Types: Marijuana    Comment: 08/15/21 rarely   Sexual activity: Yes    Birth control/protection: I.U.D.  Other Topics Concern   Not on file  Social History Narrative   Grew up in [place]   Married 5 yrs   Occupation - prev occupation.  Preschool teacher and nanny   Never smoked.  No etoh no drugs   Caffeine- soda 3-4 glasses daily   Social Drivers of Health   Financial Resource Strain: Low Risk  (03/23/2023)   Overall Financial Resource Strain (CARDIA)     Difficulty of Paying Living Expenses: Not very hard  Food Insecurity: No Food Insecurity (03/23/2023)   Hunger Vital Sign    Worried About Running Out of Food in the Last Year: Never true    Ran Out of Food in the Last Year: Never true  Transportation Needs: No Transportation Needs (03/23/2023)   PRAPARE - Administrator, Civil Service (Medical): No    Lack of Transportation (Non-Medical): No  Physical Activity: Insufficiently Active (03/23/2023)   Exercise Vital Sign    Days of Exercise per Week: 2 days    Minutes of Exercise per Session: 30 min  Stress: Stress Concern Present (03/23/2023)   [employer] of Occupational Health -  Occupational Stress Questionnaire    Feeling of Stress : To some extent  Social Connections: Moderately Isolated (03/23/2023)   Social Connection and Isolation Panel [NHANES]    Frequency of Communication with Friends and Family: More than three times a week    Frequency of Social Gatherings with Friends and Family: Twice a week    Attends Religious Services: Never    Database administrator or Organizations: No    Attends Engineer, structural: Not on file    Marital Status: Married   Vitals:   12/22/23 1514  BP: 100/70  Pulse: 94  Resp: 12  Temp: 98.6 F (37 C)  SpO2: 99%   Body mass index is 34.16 kg/m.  Physical Exam Vitals and nursing note reviewed.  Constitutional:      General: She is not in acute distress.    Appearance: She is well-developed.  HENT:     Head: Normocephalic and atraumatic.     Right Ear: Tympanic membrane, ear canal and external ear normal.     Left Ear: Tympanic membrane, ear canal and external ear normal.     Mouth/Throat:     Mouth: Mucous membranes are moist.     Pharynx: Oropharynx is clear. Uvula midline.  Eyes:     Extraocular Movements: Extraocular movements intact.     Conjunctiva/sclera: Conjunctivae normal.     Pupils: Pupils are equal, round, and reactive to light.  Neck:     Thyroid :  Thyromegaly (mild) present. No thyroid  mass.  Cardiovascular:     Rate and Rhythm: Normal rate and regular rhythm.     Pulses:          Dorsalis pedis pulses are 2+ on the right side and 2+ on the left side.     Heart sounds: No murmur heard. Pulmonary:     Effort: Pulmonary effort is normal. No respiratory distress.     Breath sounds: Normal breath sounds.  Abdominal:     Palpations: Abdomen is soft. There is no hepatomegaly or mass.     Tenderness: There is no abdominal tenderness.  Genitourinary:    Comments: Deferred to gyn. Musculoskeletal:     Right lower leg: No edema.     Left lower leg: No edema.     Comments: No major deformity or signs of synovitis appreciated.  Lymphadenopathy:     Cervical: No cervical adenopathy.     Upper Body:     Right upper body: No supraclavicular adenopathy.     Left upper body: No supraclavicular adenopathy.  Skin:    General: Skin is warm.     Findings: No erythema or rash.  Neurological:     General: No focal deficit present.     Mental Status: She is alert and oriented to person, place, and time.     Cranial Nerves: No cranial nerve deficit.     Sensory: No sensory deficit.     Motor: No weakness.     Coordination: Coordination normal.     Gait: Gait normal.     Deep Tendon Reflexes:     Reflex Scores:      Bicep reflexes are 2+ on the right side and 2+ on the left side.      Patellar reflexes are 2+ on the right side and 2+ on the left side. Psychiatric:        Mood and Affect: Mood and affect normal.   ASSESSMENT AND PLAN:  Katie [name] was seen today for her  routine general medical examination.   Orders Placed This Encounter  Procedures   Comprehensive metabolic panel with GFR   VITAMIN D  25 Hydroxy (Vit-D Deficiency, Fractures)   Hemoglobin A1c   Lipid panel    Routine general medical examination at a health care facility Assessment & Plan: We discussed the importance of regular physical activity and healthy diet for  prevention of chronic illness and/or complications. Preventive guidelines reviewed. Vaccination up-to-date. Continue her female preventive care with gynecologist. She is not fasting today, so she will be back for blood work in a few days. Next CPE in a year.   Hyperlipemia, mixed Assessment & Plan: Currently on rosuvastatin  20 mg daily. Continue low fat diet. Labs TG 326, he has been 800s in the past. She is not fasting today, will be back in a couple days for fasting lipid panel.  Orders: -     Comprehensive metabolic panel with GFR; Future -     Lipid panel; Future  Vitamin D  deficiency, unspecified Assessment & Plan: Currently she is not on vitamin D  supplementation. Further recommendation will be given according to 25 OH vitamin D  result.  Orders: -     VITAMIN D  25 Hydroxy (Vit-D Deficiency, Fractures); Future  Screening for endocrine, metabolic and immunity disorder -     Hemoglobin A1c; Future  Return in 1 year (on 12/21/2024) for CPE, Labs, chronic problems.  I, Fritz Jewel Wierda, acting as a scribe for Hiawatha Dressel [place], MD., have documented all relevant documentation on the behalf of Wanda Rideout [place], MD, as directed by  Imoni Kohen [place], MD while in the presence of Esmeralda Blanford [place], MD.   I, Peja Allender [place], MD, have reviewed all documentation for this visit. The documentation on 12/22/23 for the exam, diagnosis, procedures, and orders are all accurate and complete.  Cameron Katayama G. [place], MD  [facility]. [place] office.

## 2023-12-22 NOTE — Assessment & Plan Note (Signed)
 Currently on rosuvastatin  20 mg daily. Continue low fat diet. Labs TG 326, he has been 800s in the past. She is not fasting today, will be back in a couple days for fasting lipid panel.

## 2023-12-22 NOTE — Assessment & Plan Note (Signed)
Currently she is not on vitamin D supplementation. Further recommendation will be given according to 25 OH vitamin D result. 

## 2023-12-22 NOTE — Assessment & Plan Note (Signed)
 We discussed the importance of regular physical activity and healthy diet for prevention of chronic illness and/or complications. Preventive guidelines reviewed. Vaccination up-to-date. Continue her female preventive care with gynecologist. She is not fasting today, so she will be back for blood work in a few days. Next CPE in a year.

## 2023-12-23 ENCOUNTER — Other Ambulatory Visit

## 2023-12-23 DIAGNOSIS — E559 Vitamin D deficiency, unspecified: Secondary | ICD-10-CM

## 2023-12-23 DIAGNOSIS — E782 Mixed hyperlipidemia: Secondary | ICD-10-CM

## 2023-12-23 DIAGNOSIS — Z130 Encounter for screening for diseases of the blood and blood-forming organs and certain disorders involving the immune mechanism: Secondary | ICD-10-CM

## 2023-12-23 NOTE — Addendum Note (Signed)
 Addended by: Mahamed Zalewski M on: 12/23/2023 08:08 AM   Modules accepted: Orders

## 2023-12-24 LAB — COMPREHENSIVE METABOLIC PANEL WITH GFR
AG Ratio: 1.8 (calc) (ref 1.0–2.5)
ALT: 14 U/L (ref 6–29)
AST: 18 U/L (ref 10–30)
Albumin: 4.6 g/dL (ref 3.6–5.1)
Alkaline phosphatase (APISO): 83 U/L (ref 31–125)
BUN: 23 mg/dL (ref 7–25)
CO2: 23 mmol/L (ref 20–32)
Calcium: 9.4 mg/dL (ref 8.6–10.2)
Chloride: 105 mmol/L (ref 98–110)
Creat: 0.82 mg/dL (ref 0.50–0.99)
Globulin: 2.6 g/dL (ref 1.9–3.7)
Glucose, Bld: 99 mg/dL (ref 65–99)
Potassium: 4 mmol/L (ref 3.5–5.3)
Sodium: 138 mmol/L (ref 135–146)
Total Bilirubin: 0.3 mg/dL (ref 0.2–1.2)
Total Protein: 7.2 g/dL (ref 6.1–8.1)
eGFR: 92 mL/min/{1.73_m2} (ref >=60)

## 2023-12-24 LAB — LIPID PANEL
Cholesterol: 183 mg/dL (ref <200)
HDL: 48 mg/dL — ABNORMAL LOW (ref >=50)
LDL Cholesterol (Calc): 100 mg/dL — ABNORMAL HIGH
Non-HDL Cholesterol (Calc): 135 mg/dL — ABNORMAL HIGH (ref <130)
Total CHOL/HDL Ratio: 3.8 (calc) (ref <5.0)
Triglycerides: 248 mg/dL — ABNORMAL HIGH (ref <150)

## 2023-12-24 LAB — HEMOGLOBIN A1C
Hgb A1c MFr Bld: 5.8 % — ABNORMAL HIGH (ref <5.7)
Mean Plasma Glucose: 120 mg/dL
eAG (mmol/L): 6.6 mmol/L

## 2023-12-24 LAB — VITAMIN D 25 HYDROXY (VIT D DEFICIENCY, FRACTURES): Vit D, 25-Hydroxy: 31 ng/mL (ref 30–100)

## 2023-12-28 ENCOUNTER — Encounter: Payer: Self-pay | Admitting: Family Medicine

## 2024-01-03 ENCOUNTER — Ambulatory Visit: Payer: Self-pay | Admitting: Family Medicine

## 2024-01-03 ENCOUNTER — Other Ambulatory Visit: Payer: Self-pay | Admitting: Family Medicine

## 2024-01-03 DIAGNOSIS — E782 Mixed hyperlipidemia: Secondary | ICD-10-CM

## 2024-01-03 MED ORDER — ROSUVASTATIN CALCIUM 20 MG PO TABS: 20.0000 mg | ORAL_TABLET | Freq: Every day | ORAL | 3 refills | Status: AC

## 2024-01-04 NOTE — Telephone Encounter (Signed)
 See result note.

## 2024-04-05 ENCOUNTER — Other Ambulatory Visit (HOSPITAL_COMMUNITY): Payer: Self-pay

## 2024-04-05 ENCOUNTER — Telehealth: Payer: Self-pay

## 2024-04-05 NOTE — Telephone Encounter (Signed)
 Pharmacy Patient Advocate Encounter  Received notification from [employer] that Prior Authorization for Nurtec 75mg  Tablet has been APPROVED from 04/05/2024 to 04/05/2025. Ran test claim, Copay is $0. This test claim was processed through [facility]- copay amounts may vary at other pharmacies due to pharmacy/plan contracts, or as the patient moves through the different stages of their insurance plan.   PA #/Case ID/Reference #: [id number]

## 2024-04-05 NOTE — Telephone Encounter (Signed)
 Pharmacy Patient Advocate Encounter   Received notification from [employer] that prior authorization for Nurtec 75mg  is required/requested.   Insurance verification completed.   The patient is insured through [employer] .   Per test claim: PA required; PA submitted to above mentioned insurance via Latent Key/confirmation #/EOC [id number] Status is pending

## 2024-10-06 ENCOUNTER — Telehealth: Admitting: Adult Health
